# Patient Record
Sex: Female | Born: 1960 | Race: White | Hispanic: No | Marital: Married | Smoking: Former smoker
Health system: Southern US, Community
[De-identification: ages and names within clinical notes are randomized; demographics above are authoritative.]

## PROBLEM LIST (undated history)

## (undated) DIAGNOSIS — I10 Essential (primary) hypertension: Secondary | ICD-10-CM

## (undated) DIAGNOSIS — K219 Gastro-esophageal reflux disease without esophagitis: Secondary | ICD-10-CM

## (undated) DIAGNOSIS — Z8619 Personal history of other infectious and parasitic diseases: Secondary | ICD-10-CM

## (undated) DIAGNOSIS — M8580 Other specified disorders of bone density and structure, unspecified site: Secondary | ICD-10-CM

## (undated) DIAGNOSIS — M1990 Unspecified osteoarthritis, unspecified site: Secondary | ICD-10-CM

## (undated) HISTORY — PX: JOINT REPLACEMENT: SHX530

## (undated) HISTORY — PX: ROOT CANAL: SHX2363

## (undated) HISTORY — PX: WISDOM TOOTH EXTRACTION: SHX21

## (undated) HISTORY — DX: Essential (primary) hypertension: I10

## (undated) HISTORY — DX: Other specified disorders of bone density and structure, unspecified site: M85.80

## (undated) HISTORY — DX: Gastro-esophageal reflux disease without esophagitis: K21.9

## (undated) HISTORY — DX: Unspecified osteoarthritis, unspecified site: M19.90

## (undated) HISTORY — DX: Personal history of other infectious and parasitic diseases: Z86.19

---

## 1988-02-27 HISTORY — PX: OVARIAN CYST REMOVAL: SHX89

## 1989-02-26 HISTORY — PX: CHOLECYSTECTOMY: SHX55

## 2000-03-06 ENCOUNTER — Encounter: Admission: RE | Admit: 2000-03-06 | Discharge: 2000-04-25 | Payer: Self-pay | Admitting: Internal Medicine

## 2000-06-18 ENCOUNTER — Other Ambulatory Visit: Admission: RE | Admit: 2000-06-18 | Discharge: 2000-06-18 | Payer: Self-pay | Admitting: Obstetrics and Gynecology

## 2000-11-06 ENCOUNTER — Encounter: Payer: Self-pay | Admitting: Obstetrics and Gynecology

## 2000-11-06 ENCOUNTER — Ambulatory Visit (HOSPITAL_COMMUNITY): Admission: RE | Admit: 2000-11-06 | Discharge: 2000-11-06 | Payer: Self-pay | Admitting: Obstetrics and Gynecology

## 2001-06-18 ENCOUNTER — Other Ambulatory Visit: Admission: RE | Admit: 2001-06-18 | Discharge: 2001-06-18 | Payer: Self-pay | Admitting: Obstetrics and Gynecology

## 2002-10-14 ENCOUNTER — Other Ambulatory Visit: Admission: RE | Admit: 2002-10-14 | Discharge: 2002-10-14 | Payer: Self-pay | Admitting: Obstetrics and Gynecology

## 2002-10-14 ENCOUNTER — Encounter: Payer: Self-pay | Admitting: Obstetrics and Gynecology

## 2002-10-14 ENCOUNTER — Ambulatory Visit (HOSPITAL_COMMUNITY): Admission: RE | Admit: 2002-10-14 | Discharge: 2002-10-14 | Payer: Self-pay | Admitting: Obstetrics and Gynecology

## 2004-06-30 ENCOUNTER — Ambulatory Visit: Payer: Self-pay | Admitting: Internal Medicine

## 2004-10-27 ENCOUNTER — Encounter: Payer: Self-pay | Admitting: Internal Medicine

## 2004-12-01 ENCOUNTER — Ambulatory Visit: Payer: Self-pay | Admitting: Internal Medicine

## 2006-05-20 ENCOUNTER — Ambulatory Visit: Payer: Self-pay | Admitting: Internal Medicine

## 2006-05-21 ENCOUNTER — Encounter: Payer: Self-pay | Admitting: Internal Medicine

## 2006-09-03 ENCOUNTER — Ambulatory Visit: Payer: Self-pay | Admitting: Internal Medicine

## 2006-09-03 DIAGNOSIS — R229 Localized swelling, mass and lump, unspecified: Secondary | ICD-10-CM

## 2006-10-04 ENCOUNTER — Encounter: Payer: Self-pay | Admitting: Internal Medicine

## 2007-03-10 ENCOUNTER — Ambulatory Visit: Payer: Self-pay | Admitting: Internal Medicine

## 2007-03-10 DIAGNOSIS — M79609 Pain in unspecified limb: Secondary | ICD-10-CM | POA: Insufficient documentation

## 2007-06-25 ENCOUNTER — Encounter: Payer: Self-pay | Admitting: Infectious Diseases

## 2007-07-02 ENCOUNTER — Telehealth (INDEPENDENT_AMBULATORY_CARE_PROVIDER_SITE_OTHER): Payer: Self-pay | Admitting: *Deleted

## 2007-07-07 ENCOUNTER — Encounter: Payer: Self-pay | Admitting: Internal Medicine

## 2007-07-08 ENCOUNTER — Ambulatory Visit: Payer: Self-pay | Admitting: Internal Medicine

## 2007-07-08 DIAGNOSIS — B333 Retrovirus infections, not elsewhere classified: Secondary | ICD-10-CM

## 2007-07-08 DIAGNOSIS — IMO0001 Reserved for inherently not codable concepts without codable children: Secondary | ICD-10-CM

## 2007-07-08 HISTORY — DX: Reserved for inherently not codable concepts without codable children: IMO0001

## 2007-08-12 ENCOUNTER — Ambulatory Visit: Payer: Self-pay | Admitting: Infectious Diseases

## 2008-03-04 ENCOUNTER — Encounter: Admission: RE | Admit: 2008-03-04 | Discharge: 2008-03-04 | Payer: Self-pay | Admitting: Sports Medicine

## 2008-03-04 ENCOUNTER — Ambulatory Visit: Payer: Self-pay | Admitting: Sports Medicine

## 2008-03-04 DIAGNOSIS — M25559 Pain in unspecified hip: Secondary | ICD-10-CM

## 2008-03-04 DIAGNOSIS — M722 Plantar fascial fibromatosis: Secondary | ICD-10-CM

## 2008-04-13 ENCOUNTER — Ambulatory Visit (HOSPITAL_COMMUNITY): Admission: RE | Admit: 2008-04-13 | Discharge: 2008-04-13 | Payer: Self-pay | Admitting: Obstetrics and Gynecology

## 2008-04-20 ENCOUNTER — Ambulatory Visit: Payer: Self-pay | Admitting: Sports Medicine

## 2008-04-23 ENCOUNTER — Other Ambulatory Visit: Admission: RE | Admit: 2008-04-23 | Discharge: 2008-04-23 | Payer: Self-pay | Admitting: Obstetrics and Gynecology

## 2008-06-23 ENCOUNTER — Ambulatory Visit: Payer: Self-pay | Admitting: Sports Medicine

## 2008-07-14 ENCOUNTER — Ambulatory Visit: Payer: Self-pay | Admitting: Sports Medicine

## 2008-08-05 ENCOUNTER — Ambulatory Visit: Payer: Self-pay | Admitting: Sports Medicine

## 2008-11-11 ENCOUNTER — Ambulatory Visit: Payer: Self-pay | Admitting: Sports Medicine

## 2008-11-11 DIAGNOSIS — M1610 Unilateral primary osteoarthritis, unspecified hip: Secondary | ICD-10-CM | POA: Insufficient documentation

## 2008-11-11 DIAGNOSIS — M169 Osteoarthritis of hip, unspecified: Secondary | ICD-10-CM | POA: Insufficient documentation

## 2008-11-11 HISTORY — DX: Osteoarthritis of hip, unspecified: M16.9

## 2008-11-11 HISTORY — DX: Unilateral primary osteoarthritis, unspecified hip: M16.10

## 2008-12-09 ENCOUNTER — Encounter: Admission: RE | Admit: 2008-12-09 | Discharge: 2008-12-09 | Payer: Self-pay | Admitting: Orthopedic Surgery

## 2008-12-13 ENCOUNTER — Encounter: Payer: Self-pay | Admitting: Sports Medicine

## 2009-02-26 HISTORY — PX: TOTAL HIP ARTHROPLASTY: SHX124

## 2009-03-22 ENCOUNTER — Encounter: Payer: Self-pay | Admitting: Internal Medicine

## 2009-04-23 ENCOUNTER — Encounter: Payer: Self-pay | Admitting: Internal Medicine

## 2009-06-08 ENCOUNTER — Ambulatory Visit (HOSPITAL_COMMUNITY): Admission: RE | Admit: 2009-06-08 | Discharge: 2009-06-08 | Payer: Self-pay | Admitting: Obstetrics and Gynecology

## 2009-06-14 ENCOUNTER — Encounter: Payer: Self-pay | Admitting: Internal Medicine

## 2009-11-17 ENCOUNTER — Encounter: Payer: Self-pay | Admitting: Internal Medicine

## 2010-03-28 NOTE — Letter (Signed)
Summary: Appts Meds & Instructions/[facility]  [facility] Meds & Instructions/[facility]   Imported By: Lanelle Bal 05/05/2009 09:40:01  _____________________________________________________________________  External Attachment:    Type:   Image     Comment:   External Document

## 2010-03-28 NOTE — Letter (Signed)
Summary: doing well----[facility]  [facility]   Imported By: Lanelle Bal 12/05/2009 12:06:05  _____________________________________________________________________  External Attachment:    Type:   Image     Comment:   External Document

## 2010-03-28 NOTE — Letter (Signed)
Summary: s/p R hip replacement  [facility]   Imported By: Lanelle Bal 06/28/2009 09:48:29  _____________________________________________________________________  External Attachment:    Type:   Image     Comment:   External Document

## 2010-03-28 NOTE — Letter (Signed)
Summary: [facility]  [facility]   Imported By: Lanelle Bal 04/05/2009 07:54:19  _____________________________________________________________________  External Attachment:    Type:   Image     Comment:   External Document

## 2010-05-19 ENCOUNTER — Other Ambulatory Visit: Payer: Self-pay | Admitting: Obstetrics and Gynecology

## 2010-05-19 DIAGNOSIS — Z1231 Encounter for screening mammogram for malignant neoplasm of breast: Secondary | ICD-10-CM

## 2010-06-16 ENCOUNTER — Ambulatory Visit (HOSPITAL_COMMUNITY)
Admission: RE | Admit: 2010-06-16 | Discharge: 2010-06-16 | Disposition: A | Discharge status: Home / Self Care | Payer: BC Managed Care – PPO | Source: Ambulatory Visit | Attending: Obstetrics and Gynecology | Admitting: Obstetrics and Gynecology

## 2010-06-16 DIAGNOSIS — Z1231 Encounter for screening mammogram for malignant neoplasm of breast: Secondary | ICD-10-CM | POA: Insufficient documentation

## 2011-04-24 ENCOUNTER — Encounter: Payer: Self-pay | Admitting: Internal Medicine

## 2011-04-24 ENCOUNTER — Ambulatory Visit (INDEPENDENT_AMBULATORY_CARE_PROVIDER_SITE_OTHER): Payer: BC Managed Care – PPO | Admitting: Internal Medicine

## 2011-04-24 VITALS — BP 128/74 | HR 81 | Temp 98.4°F | Wt 169.0 lb

## 2011-04-24 DIAGNOSIS — L918 Other hypertrophic disorders of the skin: Secondary | ICD-10-CM

## 2011-04-24 DIAGNOSIS — L919 Hypertrophic disorder of the skin, unspecified: Secondary | ICD-10-CM

## 2011-04-24 DIAGNOSIS — I8390 Asymptomatic varicose veins of unspecified lower extremity: Secondary | ICD-10-CM

## 2011-04-24 HISTORY — DX: Asymptomatic varicose veins of unspecified lower extremity: I83.90

## 2011-04-24 HISTORY — DX: Other hypertrophic disorders of the skin: L91.8

## 2011-04-24 NOTE — Assessment & Plan Note (Signed)
New "lesion" seems to be the scar of the skin tag that fell of. Recommend reasses in 3 months, if a suspicious lesion is present , will need to came out. Pt in agreement

## 2011-04-24 NOTE — Assessment & Plan Note (Signed)
Exam consistent w/ varicose veins. Discussed what it is, sx of phlebitis, use of compression stokings

## 2011-04-24 NOTE — Progress Notes (Signed)
  Subjective:    Patient ID: Lauren Walters, female    DOB: [birth year], 51 y.o.   MRN: [id number]  HPI New patient, acute visit Long history of a skin growth under the right breast (classic skin tag per pt description), a week ago the area got swollen, tender. The tag fell off but now she has a new lesion there. Also last week noted the veins at the right popliteal area are definitely larger than the left. The area is sometimes achy, pain decreases with stretching.  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 No Lower  extremity edema.     Objective:   Physical Exam Alert, oriented x3. No apparent distress. Skin: Right under the right breast, there is a 2 mm dome shaped lesion, slightly red, no discharge. At the left breast she has a classic skin tag that has been there for years. Lower extremities: No edema, she has few, small varicose veins mostly at the popliteal area bilaterally, they are actually symmetric. No evidence of phlebitis. There is no mass in the popliteal area on either leg I can tell. Normal pedal pulses bilaterally.     Assessment & Plan:

## 2011-04-24 NOTE — Patient Instructions (Signed)
Came back in 3 - 4 months if the lesion under the breast is not better or gone   Varicose Veins Varicose veins are veins that have become enlarged and twisted. CAUSES This condition is the result of valves in the veins not working properly. Valves in the veins help return blood from the leg to the heart. If these valves are damaged, blood flows backwards and backs up into the veins in the leg near the skin. This causes the veins to become larger. People who are on their feet a lot, who are pregnant, or who are overweight are more likely to develop varicose veins. SYMPTOMS   Bulging, twisted-appearing, bluish veins, most commonly found on the legs.   Leg pain or a feeling of heaviness. These symptoms may be worse at the end of the day.   Leg swelling.   Skin color changes.  DIAGNOSIS  Varicose veins can usually be diagnosed with an exam of your legs by your caregiver. He or she may recommend an ultrasound of your leg veins. TREATMENT  Most varicose veins can be treated at home.However, other treatments are available for people who have persistent symptoms or who want to treat the cosmetic appearance of the varicose veins. These include:  Laser treatment of very small varicose veins.   Medicine that is shot (injected) into the vein. This medicine hardens the walls of the vein and closes off the vein. This treatment is called sclerotherapy. Afterwards, you may need to wear clothing or bandages that apply pressure.   Surgery.  HOME CARE INSTRUCTIONS   Do not stand or sit in one position for long periods of time. Do not sit with your legs crossed. Rest with your legs raised during the day.   Wear elastic stockings or support hose. Do not wear other tight, encircling garments around the legs, pelvis, or waist.   Walk as much as possible to increase blood flow.   Raise the foot of your bed at night with 2-inch blocks.   If you get a cut in the skin over the vein and the vein bleeds, lie  down with your leg raised and press on it with a clean cloth until the bleeding stops. Then place a bandage (dressing) on the cut. See your caregiver if it continues to bleed or needs stitches.  SEEK MEDICAL CARE IF:   The skin around your ankle starts to break down.   You have pain, redness, tenderness, or hard swelling developing in your leg over a vein.   You are uncomfortable due to leg pain.  Document Released: 11/22/2004 Document Revised: 10/25/2010 Document Reviewed: 04/10/2010 [facility] Patient Information 2012 [place], [place].

## 2011-06-04 ENCOUNTER — Other Ambulatory Visit: Payer: Self-pay | Admitting: Obstetrics and Gynecology

## 2011-06-04 DIAGNOSIS — Z1231 Encounter for screening mammogram for malignant neoplasm of breast: Secondary | ICD-10-CM

## 2011-06-19 ENCOUNTER — Ambulatory Visit (INDEPENDENT_AMBULATORY_CARE_PROVIDER_SITE_OTHER): Payer: BC Managed Care – PPO | Admitting: Physician Assistant

## 2011-06-19 VITALS — BP 126/83 | HR 60 | Temp 98.4°F | Resp 16 | Ht 70.0 in | Wt 166.0 lb

## 2011-06-19 DIAGNOSIS — R21 Rash and other nonspecific skin eruption: Secondary | ICD-10-CM

## 2011-06-19 LAB — POCT SKIN KOH: Skin KOH, POC: NEGATIVE

## 2011-06-19 MED ORDER — CETIRIZINE HCL 10 MG PO TABS: 10.0000 mg | ORAL_TABLET | Freq: Every day | ORAL | Status: DC

## 2011-06-19 MED ORDER — TRIAMCINOLONE ACETONIDE 0.1 % EX CREA: TOPICAL_CREAM | Freq: Two times a day (BID) | CUTANEOUS | Status: AC

## 2011-06-19 NOTE — Progress Notes (Signed)
  Subjective:    Patient ID: Lauren Walters, female    DOB: September 16, 1960, 51 y.o.   MRN: [id number]  HPI Slightly Pruritic rash on neck X ~1 week.  Lotion not helping.  No new soaps, detergents, etc.  No new pets.    Review of Systems  All other systems reviewed and are negative.       Objective:   Physical Exam  Nursing note and vitals reviewed. Constitutional: She appears well-developed and well-nourished.  HENT:  Head: Normocephalic and atraumatic.  Neck: Normal range of motion. Neck supple.  Cardiovascular: Normal rate, regular rhythm and normal heart sounds.   Pulmonary/Chest: Effort normal and breath sounds normal.  Skin:       Anterior neck with 2 maculopapular regions of dry skin  ~2X3 cm each   Results for orders placed in visit on 06/19/11  POCT SKIN KOH      Component Value Range   Skin KOH, POC Negative        Assessment & Plan:  ?Appears eczematous-start zyrtec and triamcinolone and check in with me later this week.

## 2011-07-02 ENCOUNTER — Ambulatory Visit (HOSPITAL_COMMUNITY)
Admission: RE | Admit: 2011-07-02 | Discharge: 2011-07-02 | Disposition: A | Discharge status: Home / Self Care | Payer: BC Managed Care – PPO | Source: Ambulatory Visit | Attending: Obstetrics and Gynecology | Admitting: Obstetrics and Gynecology

## 2011-07-02 DIAGNOSIS — Z1231 Encounter for screening mammogram for malignant neoplasm of breast: Secondary | ICD-10-CM | POA: Insufficient documentation

## 2011-07-18 LAB — HM MAMMOGRAPHY: HM Mammogram: NORMAL

## 2012-07-17 LAB — HM PAP SMEAR: HM Pap smear: NORMAL

## 2013-05-01 ENCOUNTER — Ambulatory Visit: Payer: BC Managed Care – PPO | Admitting: Obstetrics and Gynecology

## 2013-05-04 ENCOUNTER — Other Ambulatory Visit: Payer: Self-pay | Admitting: *Deleted

## 2013-05-04 MED ORDER — ESTRADIOL 2 MG VA RING: 2.0000 mg | VAGINAL_RING | VAGINAL | Status: DC

## 2013-05-04 NOTE — Telephone Encounter (Signed)
Last AEX 04/25/12 Last refill 04/18/2012 x 1 year refills No future appt scheduled.   Will refill once. - Pt will need AEX for further refills.

## 2013-07-17 ENCOUNTER — Ambulatory Visit (INDEPENDENT_AMBULATORY_CARE_PROVIDER_SITE_OTHER): Payer: BC Managed Care – PPO | Admitting: Family Medicine

## 2013-07-17 ENCOUNTER — Encounter: Payer: Self-pay | Admitting: Family Medicine

## 2013-07-17 VITALS — BP 126/86 | HR 80 | Temp 98.0°F | Resp 16 | Ht 70.0 in | Wt 169.2 lb

## 2013-07-17 DIAGNOSIS — Z79890 Hormone replacement therapy (postmenopausal): Secondary | ICD-10-CM | POA: Insufficient documentation

## 2013-07-17 DIAGNOSIS — F17200 Nicotine dependence, unspecified, uncomplicated: Secondary | ICD-10-CM

## 2013-07-17 HISTORY — DX: Hormone replacement therapy: Z79.890

## 2013-07-17 MED ORDER — ESTRADIOL 2 MG VA RING: 2.0000 mg | VAGINAL_RING | VAGINAL | Status: DC

## 2013-07-17 MED ORDER — BUPROPION HCL ER (XL) 150 MG PO TB24: 150.0000 mg | ORAL_TABLET | Freq: Every day | ORAL | Status: DC

## 2013-07-17 NOTE — Progress Notes (Signed)
Pre visit review using our clinic review tool, if applicable. No additional management support is needed unless otherwise documented below in the visit note. 

## 2013-07-17 NOTE — Patient Instructions (Signed)
Follow up in 4-6 weeks to recheck smoking and anxiety Schedule your complete physical in 6 months Start the Wellbutrin daily to help quit smoking Set your quit date for 2-3 weeks after starting medication Use money and area restrictions as a motivator We'll call you with your mammogram appt Call with any questions or concerns Welcome!  We're glad to have you!!!

## 2013-07-17 NOTE — Progress Notes (Signed)
   Subjective:    Patient ID: Lauren Walters, female    DOB: 1960-09-04, 53 y.o.   MRN: [id number]  HPI New to establish.  Previous MD- Romine (GYN).  Pap- 2014  Health maintenance- has never had colonoscopy, UTD on pap.  [facility]  HRT- on Estring, started by GYN.  due for mammo  Tobacco use- smoking <1 ppd, started as a teen.  Has attempted to quit multiple times in the past.  Interested in quitting at this time- 'very'.  Has tried the patch and the lozenges previously w/o success.   Review of Systems For ROS see HPI     Objective:   Physical Exam  Vitals reviewed. Constitutional: She is oriented to person, place, and time. She appears well-developed and well-nourished. No distress.  HENT:  Head: Normocephalic and atraumatic.  Eyes: Conjunctivae and EOM are normal. Pupils are equal, round, and reactive to light.  Neck: Normal range of motion. Neck supple. No thyromegaly present.  Cardiovascular: Normal rate, regular rhythm, normal heart sounds and intact distal pulses.   No murmur heard. Pulmonary/Chest: Effort normal and breath sounds normal. No respiratory distress.  Abdominal: Soft. She exhibits no distension. There is no tenderness.  Musculoskeletal: She exhibits no edema.  Lymphadenopathy:    She has no cervical adenopathy.  Neurological: She is alert and oriented to person, place, and time.  Skin: Skin is warm and dry.  Psychiatric: She has a normal mood and affect. Her behavior is normal.          Assessment & Plan:

## 2013-07-17 NOTE — Assessment & Plan Note (Signed)
New to provider, ongoing for pt.  Initiated by GYN.  Stressed that if pt wants to continue estrogen replacement that she must have yearly mammos.  Pt agreeable to this.  Order entered.

## 2013-07-17 NOTE — Assessment & Plan Note (Signed)
New to provider, chronic for pt.  Very interested in quitting at this time.  Discussed medication options- chantix/wellbutrin vs nicotine replacement vs behavioral modifications.  Pt opts for Wellbutrin coupled w/ behavioral motivators (placing money that she would spend on cigarettes in a jar to save for something she wants).  Script given.  Will follow.

## 2013-07-21 ENCOUNTER — Telehealth: Payer: Self-pay | Admitting: Family Medicine

## 2013-07-21 NOTE — Telephone Encounter (Signed)
Relevant patient education mailed to patient.  

## 2013-07-23 ENCOUNTER — Encounter: Payer: Self-pay | Admitting: Family Medicine

## 2013-07-28 ENCOUNTER — Ambulatory Visit (HOSPITAL_COMMUNITY): Payer: BC Managed Care – PPO

## 2013-08-21 ENCOUNTER — Ambulatory Visit (INDEPENDENT_AMBULATORY_CARE_PROVIDER_SITE_OTHER): Payer: BC Managed Care – PPO | Admitting: Family Medicine

## 2013-08-21 ENCOUNTER — Encounter: Payer: Self-pay | Admitting: Family Medicine

## 2013-08-21 VITALS — BP 124/80 | HR 64 | Temp 98.7°F | Resp 16 | Wt 172.0 lb

## 2013-08-21 DIAGNOSIS — F17200 Nicotine dependence, unspecified, uncomplicated: Secondary | ICD-10-CM

## 2013-08-21 NOTE — Patient Instructions (Signed)
Follow up as scheduled Call me the end of August and let me know how the smoking cessation is progressing Continue the Wellbutrin until then If needed, we can always do Chantix if needed Call with any questions or concerns HANG IN THERE!  You can do this!

## 2013-08-21 NOTE — Progress Notes (Signed)
   Subjective:    Patient ID: Lauren Walters, female    DOB: 1960-07-30, 53 y.o.   MRN: [id number]  HPI Smoking cessation- pt started Wellbutrin in late May for both anxiety and smoking.  Pt has cut back on smoking- now smoking 5-10 cigs/day which is half of her previous amount.  She's not sure if this is due to Wellbutrin.  Not ready to switch to Chantix- 'it makes me nervous'.   Review of Systems For ROS see HPI     Objective:   Physical Exam  Vitals reviewed. Constitutional: She is oriented to person, place, and time. She appears well-developed and well-nourished. No distress.  HENT:  Head: Normocephalic and atraumatic.  Neurological: She is alert and oriented to person, place, and time.  Skin: Skin is warm and dry.  Psychiatric: She has a normal mood and affect. Her behavior is normal. Thought content normal.          Assessment & Plan:

## 2013-08-21 NOTE — Progress Notes (Signed)
Pre visit review using our clinic review tool, if applicable. No additional management support is needed unless otherwise documented below in the visit note. 

## 2013-08-21 NOTE — Assessment & Plan Note (Signed)
Pt has cut tobacco use in half.  Applauded her efforts.  She is not ready to try Chantix so we will continue w/ Wellbutrin for the next 2 months and then reassess.  Pt expressed understanding and is in agreement w/ plan.

## 2013-09-11 ENCOUNTER — Other Ambulatory Visit: Payer: Self-pay | Admitting: Family Medicine

## 2013-09-11 ENCOUNTER — Ambulatory Visit (HOSPITAL_COMMUNITY)
Admission: RE | Admit: 2013-09-11 | Discharge: 2013-09-11 | Disposition: A | Discharge status: Home / Self Care | Payer: BC Managed Care – PPO | Source: Ambulatory Visit | Attending: Family Medicine | Admitting: Family Medicine

## 2013-09-11 DIAGNOSIS — Z1231 Encounter for screening mammogram for malignant neoplasm of breast: Secondary | ICD-10-CM

## 2013-09-11 DIAGNOSIS — Z79890 Hormone replacement therapy (postmenopausal): Secondary | ICD-10-CM

## 2013-10-06 ENCOUNTER — Other Ambulatory Visit: Payer: Self-pay | Admitting: Family Medicine

## 2013-10-06 NOTE — Telephone Encounter (Signed)
Med filled.  

## 2013-10-09 ENCOUNTER — Other Ambulatory Visit: Payer: Self-pay | Admitting: General Practice

## 2013-10-09 DIAGNOSIS — F17200 Nicotine dependence, unspecified, uncomplicated: Secondary | ICD-10-CM

## 2013-10-09 MED ORDER — BUPROPION HCL ER (XL) 150 MG PO TB24: 150.0000 mg | ORAL_TABLET | Freq: Every day | ORAL | Status: DC

## 2013-10-13 ENCOUNTER — Other Ambulatory Visit: Payer: Self-pay | Admitting: General Practice

## 2013-10-13 DIAGNOSIS — F17200 Nicotine dependence, unspecified, uncomplicated: Secondary | ICD-10-CM

## 2013-10-13 MED ORDER — BUPROPION HCL ER (XL) 150 MG PO TB24: 150.0000 mg | ORAL_TABLET | Freq: Every day | ORAL | Status: DC

## 2013-12-11 ENCOUNTER — Encounter: Payer: Self-pay | Admitting: Family Medicine

## 2013-12-18 ENCOUNTER — Encounter: Payer: Self-pay | Admitting: Family Medicine

## 2013-12-18 ENCOUNTER — Ambulatory Visit (INDEPENDENT_AMBULATORY_CARE_PROVIDER_SITE_OTHER): Payer: BC Managed Care – PPO | Admitting: Family Medicine

## 2013-12-18 VITALS — BP 122/78 | HR 61 | Temp 98.1°F | Resp 16 | Ht 70.0 in | Wt 176.1 lb

## 2013-12-18 DIAGNOSIS — Z0000 Encounter for general adult medical examination without abnormal findings: Secondary | ICD-10-CM | POA: Insufficient documentation

## 2013-12-18 DIAGNOSIS — Z23 Encounter for immunization: Secondary | ICD-10-CM

## 2013-12-18 DIAGNOSIS — Z1211 Encounter for screening for malignant neoplasm of colon: Secondary | ICD-10-CM

## 2013-12-18 DIAGNOSIS — Z720 Tobacco use: Secondary | ICD-10-CM

## 2013-12-18 DIAGNOSIS — F17200 Nicotine dependence, unspecified, uncomplicated: Secondary | ICD-10-CM

## 2013-12-18 HISTORY — DX: Encounter for general adult medical examination without abnormal findings: Z00.00

## 2013-12-18 LAB — HEPATIC FUNCTION PANEL
ALBUMIN: 4.2 g/dL (ref 3.5–5.2)
ALK PHOS: 51 U/L (ref 39–117)
ALT: 21 U/L (ref 0–35)
AST: 31 U/L (ref 0–37)
Bilirubin, Direct: 0 mg/dL (ref 0.0–0.3)
TOTAL PROTEIN: 7.7 g/dL (ref 6.0–8.3)
Total Bilirubin: 0.6 mg/dL (ref 0.2–1.2)

## 2013-12-18 LAB — CBC WITH DIFFERENTIAL/PLATELET
BASOS PCT: 0.8 % (ref 0.0–3.0)
Basophils Absolute: 0 10*3/uL (ref 0.0–0.1)
EOS ABS: 0.2 10*3/uL (ref 0.0–0.7)
Eosinophils Relative: 2.9 % (ref 0.0–5.0)
HEMATOCRIT: 44.7 % (ref 36.0–46.0)
Hemoglobin: 14.7 g/dL (ref 12.0–15.0)
LYMPHS ABS: 1.9 10*3/uL (ref 0.7–4.0)
Lymphocytes Relative: 29.4 % (ref 12.0–46.0)
MCHC: 32.8 g/dL (ref 30.0–36.0)
MCV: 94.7 fl (ref 78.0–100.0)
MONO ABS: 0.4 10*3/uL (ref 0.1–1.0)
Monocytes Relative: 6.6 % (ref 3.0–12.0)
NEUTROS PCT: 60.3 % (ref 43.0–77.0)
Neutro Abs: 3.9 10*3/uL (ref 1.4–7.7)
PLATELETS: 259 10*3/uL (ref 150.0–400.0)
RBC: 4.73 Mil/uL (ref 3.87–5.11)
RDW: 14.1 % (ref 11.5–15.5)
WBC: 6.4 10*3/uL (ref 4.0–10.5)

## 2013-12-18 LAB — LIPID PANEL
CHOL/HDL RATIO: 2
CHOLESTEROL: 221 mg/dL — AB (ref 0–200)
HDL: 88.7 mg/dL (ref >39.00)
LDL CALC: 109 mg/dL — AB (ref 0–99)
NonHDL: 132.3
TRIGLYCERIDES: 116 mg/dL (ref 0.0–149.0)
VLDL: 23.2 mg/dL (ref 0.0–40.0)

## 2013-12-18 LAB — BASIC METABOLIC PANEL
BUN: 12 mg/dL (ref 6–23)
CALCIUM: 9.8 mg/dL (ref 8.4–10.5)
CO2: 27 mEq/L (ref 19–32)
CREATININE: 0.9 mg/dL (ref 0.4–1.2)
Chloride: 107 mEq/L (ref 96–112)
GFR: 73.72 mL/min (ref >60.00)
GLUCOSE: 94 mg/dL (ref 70–99)
Potassium: 4.1 mEq/L (ref 3.5–5.1)
Sodium: 141 mEq/L (ref 135–145)

## 2013-12-18 LAB — TSH: TSH: 1.8 u[IU]/mL (ref 0.35–4.50)

## 2013-12-18 LAB — VITAMIN D 25 HYDROXY (VIT D DEFICIENCY, FRACTURES): VITD: 30.87 ng/mL (ref 30.00–100.00)

## 2013-12-18 NOTE — Progress Notes (Signed)
   Subjective:    Patient ID: Lauren Walters, female    DOB: 12/05/1960, 53 y.o.   MRN: [id number]  HPI CPE- UTD on mammo, pap.  Due for colonoscopy- pt needs to wait until after the new year due to scheduling.  Pt continues to smoke 1/2 ppd.  Was not successful w/ Wellbutrin- stopped meds.  Pt wants to think about Chantix.   Review of Systems Patient reports no vision/ hearing changes, adenopathy,fever, weight change,  persistant/recurrent hoarseness , swallowing issues, chest pain, palpitations, edema, persistant/recurrent cough, hemoptysis, dyspnea (rest/exertional/paroxysmal nocturnal), gastrointestinal bleeding (melena, rectal bleeding), abdominal pain, significant heartburn, bowel changes, GU symptoms (dysuria, hematuria, incontinence), Gyn symptoms (abnormal  bleeding, pain),  syncope, focal weakness, memory loss, numbness & tingling, skin/hair/nail changes, abnormal bruising or bleeding, anxiety, or depression.     Objective:   Physical Exam General Appearance:    Alert, cooperative, no distress, appears stated age  Head:    Normocephalic, without obvious abnormality, atraumatic  Eyes:    PERRL, conjunctiva/corneas clear, EOM's intact, fundi    benign, both eyes  Ears:    Normal TM's and external ear canals, both ears  Nose:   Nares normal, septum midline, mucosa normal, no drainage    or sinus tenderness  Throat:   Lips, mucosa, and tongue normal; teeth and gums normal  Neck:   Supple, symmetrical, trachea midline, no adenopathy;    Thyroid: no enlargement/tenderness/nodules  Back:     Symmetric, no curvature, ROM normal, no CVA tenderness  Lungs:     Clear to auscultation bilaterally, respirations unlabored  Chest Wall:    No tenderness or deformity   Heart:    Regular rate and rhythm, S1 and S2 normal, no murmur, rub   or gallop  Breast Exam:    Deferred to mammo  Abdomen:     Soft, non-tender, bowel sounds active all four quadrants,    no masses, no organomegaly    Genitalia:    Deferred  Rectal:    Extremities:   Extremities normal, atraumatic, no cyanosis or edema  Pulses:   2+ and symmetric all extremities  Skin:   Skin color, texture, turgor normal, no rashes or lesions  Lymph nodes:   Cervical, supraclavicular, and axillary nodes normal  Neurologic:   CNII-XII intact, normal strength, sensation and reflexes    throughout          Assessment & Plan:

## 2013-12-18 NOTE — Progress Notes (Signed)
Pre visit review using our clinic review tool, if applicable. No additional management support is needed unless otherwise documented below in the visit note. 

## 2013-12-18 NOTE — Patient Instructions (Signed)
Follow up in 1 yr or as needed We'll notify you of your lab results and make any changes if needed Please consider quitting smoking- you can do it!  Consider Chantix and let me know Keep up the good work!  You look great! Call with any questions or concerns Happy Fall!!!

## 2013-12-18 NOTE — Assessment & Plan Note (Signed)
Again encouraged pt to quit smoking.  Discussed use of Chantix.  Pt prefers to do additional research and think about this before proceeding.  Pt to call or mychart if she decides to move forward.

## 2013-12-18 NOTE — Assessment & Plan Note (Signed)
Pt's PE WNL.  UTD on mammo and pap.  Due for colonoscopy but unable to do so until Jan 2016- referral entered.  Check labs.  Anticipatory guidance provided.

## 2013-12-21 ENCOUNTER — Telehealth: Payer: Self-pay | Admitting: Family Medicine

## 2013-12-21 NOTE — Telephone Encounter (Signed)
emmi emailed °

## 2013-12-25 ENCOUNTER — Encounter: Payer: Self-pay | Admitting: Internal Medicine

## 2014-01-25 ENCOUNTER — Other Ambulatory Visit: Payer: Self-pay | Admitting: General Practice

## 2014-01-25 MED ORDER — ESTRADIOL 2 MG VA RING: VAGINAL_RING | VAGINAL | Status: DC

## 2014-03-26 ENCOUNTER — Encounter: Payer: BC Managed Care – PPO | Admitting: Internal Medicine

## 2014-04-01 ENCOUNTER — Other Ambulatory Visit: Payer: Self-pay | Admitting: Family Medicine

## 2014-04-01 ENCOUNTER — Encounter: Payer: Self-pay | Admitting: Family Medicine

## 2014-04-01 MED ORDER — ESTRADIOL 2 MG VA RING: VAGINAL_RING | VAGINAL | Status: DC

## 2014-04-01 NOTE — Telephone Encounter (Signed)
Med filled.  

## 2014-05-14 ENCOUNTER — Encounter: Payer: Self-pay | Admitting: Family Medicine

## 2014-05-14 MED ORDER — VARENICLINE TARTRATE 0.5 MG X 11 & 1 MG X 42 PO MISC: ORAL | Status: DC

## 2014-05-14 MED ORDER — VARENICLINE TARTRATE 1 MG PO TABS: 1.0000 mg | ORAL_TABLET | Freq: Two times a day (BID) | ORAL | Status: DC

## 2014-06-30 ENCOUNTER — Encounter: Payer: Self-pay | Admitting: Family Medicine

## 2014-12-15 ENCOUNTER — Encounter: Payer: Self-pay | Admitting: Family Medicine

## 2014-12-16 ENCOUNTER — Encounter: Payer: Self-pay | Admitting: Family

## 2014-12-22 ENCOUNTER — Encounter: Payer: BC Managed Care – PPO | Admitting: Family Medicine

## 2014-12-28 ENCOUNTER — Telehealth: Payer: Self-pay | Admitting: *Deleted

## 2014-12-28 NOTE — Telephone Encounter (Signed)
Patient signed ROI received via fax from [facility]. Forwarded to [place] to scan/email to medical records. JG//CMA

## 2015-01-27 ENCOUNTER — Telehealth: Payer: Self-pay

## 2015-01-27 NOTE — Telephone Encounter (Signed)
Called patient to update chart for Physical on tomorrow. Patient rushed says nothing new she is not complex.

## 2015-01-28 ENCOUNTER — Ambulatory Visit (INDEPENDENT_AMBULATORY_CARE_PROVIDER_SITE_OTHER): Payer: BLUE CROSS/BLUE SHIELD | Admitting: Family

## 2015-01-28 ENCOUNTER — Encounter: Payer: Self-pay | Admitting: Family

## 2015-01-28 VITALS — BP 154/77 | HR 67 | Temp 97.9°F | Resp 16 | Ht 70.0 in | Wt 176.0 lb

## 2015-01-28 DIAGNOSIS — E2839 Other primary ovarian failure: Secondary | ICD-10-CM

## 2015-01-28 DIAGNOSIS — M542 Cervicalgia: Secondary | ICD-10-CM

## 2015-01-28 DIAGNOSIS — Z0000 Encounter for general adult medical examination without abnormal findings: Secondary | ICD-10-CM

## 2015-01-28 DIAGNOSIS — Z0001 Encounter for general adult medical examination with abnormal findings: Secondary | ICD-10-CM

## 2015-01-28 LAB — URINALYSIS, ROUTINE W REFLEX MICROSCOPIC
Bilirubin Urine: NEGATIVE
KETONES UR: NEGATIVE
Leukocytes, UA: NEGATIVE
NITRITE: NEGATIVE
SPECIFIC GRAVITY, URINE: 1.01 (ref 1.000–1.030)
Total Protein, Urine: NEGATIVE
Urine Glucose: NEGATIVE
Urobilinogen, UA: 0.2 (ref 0.0–1.0)
pH: 6.5 (ref 5.0–8.0)

## 2015-01-28 LAB — BASIC METABOLIC PANEL
BUN: 11 mg/dL (ref 6–23)
CHLORIDE: 103 meq/L (ref 96–112)
CO2: 28 mEq/L (ref 19–32)
Calcium: 9.4 mg/dL (ref 8.4–10.5)
Creatinine, Ser: 0.67 mg/dL (ref 0.40–1.20)
GFR: 96.62 mL/min (ref >60.00)
Glucose, Bld: 96 mg/dL (ref 70–99)
POTASSIUM: 3.5 meq/L (ref 3.5–5.1)
SODIUM: 141 meq/L (ref 135–145)

## 2015-01-28 LAB — TSH: TSH: 1.4 u[IU]/mL (ref 0.35–4.50)

## 2015-01-28 LAB — CBC WITH DIFFERENTIAL/PLATELET
BASOS PCT: 0.5 % (ref 0.0–3.0)
Basophils Absolute: 0 10*3/uL (ref 0.0–0.1)
EOS ABS: 0.1 10*3/uL (ref 0.0–0.7)
EOS PCT: 1.7 % (ref 0.0–5.0)
HEMATOCRIT: 41.7 % (ref 36.0–46.0)
HEMOGLOBIN: 13.7 g/dL (ref 12.0–15.0)
LYMPHS PCT: 29.8 % (ref 12.0–46.0)
Lymphs Abs: 2.2 10*3/uL (ref 0.7–4.0)
MCHC: 32.8 g/dL (ref 30.0–36.0)
MCV: 93.6 fl (ref 78.0–100.0)
Monocytes Absolute: 0.5 10*3/uL (ref 0.1–1.0)
Monocytes Relative: 6.9 % (ref 3.0–12.0)
Neutro Abs: 4.5 10*3/uL (ref 1.4–7.7)
Neutrophils Relative %: 61.1 % (ref 43.0–77.0)
Platelets: 252 10*3/uL (ref 150.0–400.0)
RBC: 4.46 Mil/uL (ref 3.87–5.11)
RDW: 14.2 % (ref 11.5–15.5)
WBC: 7.4 10*3/uL (ref 4.0–10.5)

## 2015-01-28 LAB — HEPATIC FUNCTION PANEL
ALT: 17 U/L (ref 0–35)
AST: 22 U/L (ref 0–37)
Albumin: 4.4 g/dL (ref 3.5–5.2)
Alkaline Phosphatase: 44 U/L (ref 39–117)
BILIRUBIN DIRECT: 0.1 mg/dL (ref 0.0–0.3)
BILIRUBIN TOTAL: 0.5 mg/dL (ref 0.2–1.2)
Total Protein: 7 g/dL (ref 6.0–8.3)

## 2015-01-28 LAB — LIPID PANEL
CHOLESTEROL: 195 mg/dL (ref 0–200)
HDL: 80.6 mg/dL (ref >39.00)
LDL CALC: 101 mg/dL — AB (ref 0–99)
NonHDL: 114.8
TRIGLYCERIDES: 71 mg/dL (ref 0.0–149.0)
Total CHOL/HDL Ratio: 2
VLDL: 14.2 mg/dL (ref 0.0–40.0)

## 2015-01-28 MED ORDER — ESTRADIOL 2 MG VA RING: VAGINAL_RING | VAGINAL | Status: DC

## 2015-01-28 MED ORDER — MELOXICAM 7.5 MG PO TABS: 7.5000 mg | ORAL_TABLET | Freq: Every day | ORAL | Status: DC

## 2015-01-28 NOTE — Progress Notes (Signed)
Pre visit review using our clinic review tool, if applicable. No additional management support is needed unless otherwise documented below in the visit note. 

## 2015-01-28 NOTE — Patient Instructions (Addendum)
Start meloxicam (anti-inflammatory) once daily. Call if pain/tingling in arms worsens or does not improve. Complete lab work prior to leaving.

## 2015-01-28 NOTE — Progress Notes (Signed)
Subjective:    Patient ID: Lauren Walters, female    DOB: 05-13-1960, 54 y.o.   MRN: [id number]  HPI  Patient presents today for complete physical.  Immunizations: tetanus and flu up to date Diet: fair diet- feels like she could eat better Exercise: very little, enjoys biking/tennis and swimming.  Walks some at lunch- 30 minutes 5 days a week.  Colonoscopy: due  Dexa:  due Pap Smear: 2014- normal Mammogram: 7/15 Tobacco abuse- has chantix rx at home, 1/2 PPD sometimes more/sometimes less    Pt reports neck pain- intermittent tingling in right hand/arm.  This has been going on for a few months.  Notes that pain has become progressively achier.  Notices more when she drives. Denies associated weakness.  Tingling, intermittent.    Wt Readings from Last 3 Encounters:  01/28/15 176 lb (79.833 kg)  12/18/13 176 lb 2 oz (79.89 kg)  08/21/13 172 lb (78.019 kg)    Review of Systems  Constitutional: Negative for unexpected weight change.  HENT: Negative for rhinorrhea.   Respiratory: Negative for shortness of breath.        Mild cough  Cardiovascular: Negative for chest pain and leg swelling.  Gastrointestinal: Negative for diarrhea, constipation and blood in stool.  Genitourinary: Negative for dysuria and frequency.  Musculoskeletal: Negative for back pain.       Mild left knee pain occasionally  Skin: Negative for rash.  Neurological: Negative for headaches.  Hematological: Negative for adenopathy.  Psychiatric/Behavioral:       Denies depression/anxiety   Past Medical History  Diagnosis Date  . Arthritis   . History of chicken pox     Social History   Social History  . Marital Status: Married    Spouse Name: N/A  . Number of Children: N/A  . Years of Education: N/A   Occupational History  . Not on file.   Social History Main Topics  . Smoking status: Current Every Day Smoker  . Smokeless tobacco: Not on file  . Alcohol Use: 8.4 oz/week    14 Glasses of  wine per week  . Drug Use: No  . Sexual Activity: Not on file   Other Topics Concern  . 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 Joint replacement      R hip  . Cholecystectomy  1991  . Ovarian cyst removal  1990  . Cesarean section  1995  . Cesarean section  1997  . Total hip arthroplasty  2011    Family History  Problem Relation Age of Onset  . Alcohol abuse Father     deceased  . Cancer Father     prostate, basal cell   . Arthritis Father   . Heart disease Father   . Arthritis Paternal Grandfather   . Heart disease Paternal Grandfather   . Heart disease Maternal Grandfather     No Known Allergies  No current outpatient prescriptions on file prior to visit.   No current facility-administered medications on file prior to visit.    BP 154/77 mmHg  Pulse 67  Temp(Src) 97.9 F (36.6 C) (Oral)  Resp 16  Ht 5\' 10"  (1.778 m)  Wt 176 lb (79.833 kg)  BMI 25.25 kg/m2  SpO2 100%        Objective:   Physical Exam Physical Exam  Constitutional: She is oriented to person, place, and time. She appears well-developed and well-nourished. No distress.  HENT:  Head: Normocephalic and atraumatic.  Right Ear: Tympanic membrane and ear canal normal.  Left Ear: Tympanic membrane and ear canal normal.  Mouth/Throat: Oropharynx is clear and moist.  Eyes: Pupils are equal, round, and reactive to light. No scleral icterus.  Neck: Normal range of motion. No thyromegaly present.  Cardiovascular: Normal rate and regular rhythm.   No murmur heard. Pulmonary/Chest: Effort normal and breath sounds normal. No respiratory distress. He has no wheezes. She has no rales. She exhibits no tenderness.  Abdominal: Soft. Bowel sounds are normal. He exhibits no distension and no  mass. There is no tenderness. There is no rebound and no guarding.  Musculoskeletal: She exhibits no edema. Bilateral UE strength is 5/5, bilateral equal hand grasps Lymphadenopathy:    She has no cervical adenopathy.  Neurological: She is alert and oriented to person, place, and time. She has normal patellar reflexes. She exhibits normal muscle tone. Coordination normal.  Skin: Skin is warm and dry.  Psychiatric: She has a normal mood and affect. Her behavior is normal. Judgment and thought content normal.  Breasts: Examined lying Right: Without masses, retractions, discharge or axillary adenopathy.  Left: Without masses, retractions, discharge or axillary adenopathy.         Assessment & Plan:          Assessment & Plan:   BP Readings from Last 3 Encounters:  01/28/15 154/77  12/18/13 122/78  08/21/13 124/80   EKG tracing is personally reviewed.  EKG notes NSR.  No acute changes.

## 2015-01-30 ENCOUNTER — Encounter: Payer: Self-pay | Admitting: Family

## 2015-01-30 DIAGNOSIS — Z0000 Encounter for general adult medical examination without abnormal findings: Secondary | ICD-10-CM | POA: Insufficient documentation

## 2015-01-30 DIAGNOSIS — M542 Cervicalgia: Secondary | ICD-10-CM | POA: Insufficient documentation

## 2015-01-30 HISTORY — DX: Cervicalgia: M54.2

## 2015-01-30 HISTORY — DX: Encounter for general adult medical examination without abnormal findings: Z00.00

## 2015-01-30 NOTE — Assessment & Plan Note (Signed)
Encouraged healthier diet,  Continue regular exercise, refer for mammogram, colo, dexa.  Discussed smoking cessation.

## 2015-01-30 NOTE — Assessment & Plan Note (Signed)
+   cervical radiculopathy. Advised pt on trial of meloxicam, if symptoms worsen or do not improve, consider MRI.

## 2015-02-13 ENCOUNTER — Encounter: Payer: Self-pay | Admitting: Family

## 2015-02-14 MED ORDER — MELOXICAM 7.5 MG PO TABS: 7.5000 mg | ORAL_TABLET | Freq: Every day | ORAL | Status: DC

## 2015-03-04 ENCOUNTER — Ambulatory Visit (HOSPITAL_BASED_OUTPATIENT_CLINIC_OR_DEPARTMENT_OTHER)
Admission: RE | Admit: 2015-03-04 | Discharge: 2015-03-04 | Disposition: A | Discharge status: Home / Self Care | Payer: BLUE CROSS/BLUE SHIELD | Source: Ambulatory Visit | Attending: Family | Admitting: Family

## 2015-03-04 ENCOUNTER — Encounter: Payer: Self-pay | Admitting: Family

## 2015-03-04 ENCOUNTER — Ambulatory Visit: Payer: BLUE CROSS/BLUE SHIELD | Admitting: Family Medicine

## 2015-03-04 ENCOUNTER — Ambulatory Visit (INDEPENDENT_AMBULATORY_CARE_PROVIDER_SITE_OTHER): Payer: BLUE CROSS/BLUE SHIELD | Admitting: Family

## 2015-03-04 VITALS — BP 144/85 | HR 73 | Temp 98.7°F | Resp 16 | Ht 70.0 in | Wt 174.0 lb

## 2015-03-04 DIAGNOSIS — E2839 Other primary ovarian failure: Secondary | ICD-10-CM | POA: Diagnosis not present

## 2015-03-04 DIAGNOSIS — M8580 Other specified disorders of bone density and structure, unspecified site: Secondary | ICD-10-CM | POA: Insufficient documentation

## 2015-03-04 DIAGNOSIS — Z1231 Encounter for screening mammogram for malignant neoplasm of breast: Secondary | ICD-10-CM | POA: Diagnosis not present

## 2015-03-04 DIAGNOSIS — F17200 Nicotine dependence, unspecified, uncomplicated: Secondary | ICD-10-CM | POA: Insufficient documentation

## 2015-03-04 DIAGNOSIS — Z780 Asymptomatic menopausal state: Secondary | ICD-10-CM | POA: Insufficient documentation

## 2015-03-04 DIAGNOSIS — I10 Essential (primary) hypertension: Secondary | ICD-10-CM

## 2015-03-04 DIAGNOSIS — Z0000 Encounter for general adult medical examination without abnormal findings: Secondary | ICD-10-CM | POA: Diagnosis not present

## 2015-03-04 DIAGNOSIS — M542 Cervicalgia: Secondary | ICD-10-CM

## 2015-03-04 DIAGNOSIS — Z13820 Encounter for screening for osteoporosis: Secondary | ICD-10-CM | POA: Insufficient documentation

## 2015-03-04 MED ORDER — VARENICLINE TARTRATE 0.5 MG X 11 & 1 MG X 42 PO MISC: ORAL | Status: DC

## 2015-03-04 NOTE — Progress Notes (Signed)
Pre visit review using our clinic review tool, if applicable. No additional management support is needed unless otherwise documented below in the visit note. 

## 2015-03-04 NOTE — Progress Notes (Signed)
Subjective:    Patient ID: Lauren Walters, female    DOB: Mar 12, 1960, 55 y.o.   MRN: [id number]  HPI  [name] is a 55 yr old female who presents today for follow up.  1) Neck pain- was given trial of meloxicam 1 month ago. Right radicular pain is not as bad.  Also has some tingling in the right foot while driving. Still has some aching.  2) Elevation of BP- has cut down on her wine consumption, daily wine consumption. BP last week 141/72 at home.   BP Readings from Last 3 Encounters:  03/04/15 144/85  01/28/15 154/77  12/18/13 122/78     Review of Systems See HPI  Past Medical History  Diagnosis Date  . Arthritis   . History of chicken pox     Social History   Social History  . Marital Status: Married    Spouse Name: N/A  . Number of Children: N/A  . Years of Education: N/A   Occupational History  . Not on file.   Social History Main Topics  . Smoking status: Current Every Day Smoker  . Smokeless tobacco: Not on file  . Alcohol Use: 8.4 oz/week    14 Glasses of wine per week  . Drug Use: No  . Sexual Activity: Not on file   Other Topics Concern  . 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 Joint replacement      R hip  . Cholecystectomy  1991  . Ovarian cyst removal  1990  . Cesarean section  1995  . Cesarean section  1997  . Total hip arthroplasty  2011    Family History  Problem Relation Age of Onset  . Alcohol abuse Father     deceased  . Cancer Father     prostate, basal cell   . Arthritis Father   . Heart disease Father   . Arthritis Paternal Grandfather   . Heart disease Paternal Grandfather   . Heart disease Maternal Grandfather     No Known Allergies  Current Outpatient Prescriptions on File Prior to Visit    Medication Sig Dispense Refill  . estradiol (ESTRING) 2 MG vaginal ring PLACE 2 MG VAGINALLY EVERY 3 (THREE) MONTHS. FOLLOW PACKAGE DIRECTIONS 1 each 3  . meloxicam (MOBIC) 7.5 MG tablet Take 1 tablet (7.5 mg total) by mouth daily. 30 tablet 0   No current facility-administered medications on file prior to visit.    BP 144/85 mmHg  Pulse 73  Temp(Src) 98.7 F (37.1 C) (Oral)  Resp 16  Ht 5\' 10"  (1.778 m)  Wt 174 lb (78.926 kg)  BMI 24.97 kg/m2  SpO2 99%       Objective:   Physical Exam  Constitutional: She appears well-developed and well-nourished.  Cardiovascular: Normal rate, regular rhythm and normal heart sounds.   No murmur heard. Pulmonary/Chest: Effort normal and breath sounds normal. No respiratory distress. She has no wheezes.  Psychiatric: She has a normal mood and affect. Her behavior is normal. Judgment and thought content normal.          Assessment & Plan:  15 min spent with pt today. >50% of this time was spent counseling pt on weight  loss, diet, HTN.

## 2015-03-04 NOTE — Patient Instructions (Addendum)
You will be contacted about your referral to physical therapy. Please continue your work with healthy low sodium diet.

## 2015-03-05 DIAGNOSIS — I10 Essential (primary) hypertension: Secondary | ICD-10-CM | POA: Insufficient documentation

## 2015-03-05 HISTORY — DX: Essential (primary) hypertension: I10

## 2015-03-05 NOTE — Assessment & Plan Note (Signed)
Rx provided for chantix- she is preparing to quit smoking.

## 2015-03-05 NOTE — Assessment & Plan Note (Signed)
We discussed referral for MRI. She declines at this time, prefers referral to PT- referral placed.

## 2015-03-05 NOTE — Assessment & Plan Note (Signed)
Improving, I think she would benefit from low dose anti-hypertensive, but she declines at this time. Wishes to continue to work on diet, exercise, weight loss.

## 2015-03-08 ENCOUNTER — Other Ambulatory Visit: Payer: Self-pay | Admitting: Family

## 2015-03-08 ENCOUNTER — Telehealth: Payer: Self-pay | Admitting: Family

## 2015-03-08 DIAGNOSIS — M8580 Other specified disorders of bone density and structure, unspecified site: Secondary | ICD-10-CM | POA: Insufficient documentation

## 2015-03-08 MED ORDER — CALCIUM CARBONATE-VITAMIN D 600-400 MG-UNIT PO TABS: 1.0000 | ORAL_TABLET | Freq: Two times a day (BID) | ORAL | Status: DC

## 2015-03-08 NOTE — Telephone Encounter (Signed)
Notified pt and she voices understanding. 

## 2015-03-08 NOTE — Telephone Encounter (Signed)
Reviewed bone density- shows mild bone thinning, osteopenia.  I recommend she start calcium in the form of of caltrate 600mg  + D one tablet by mouth twice daily. This is available over the counter.  In addition, please ensure regular weight bearing exercise such as walking.

## 2015-03-13 ENCOUNTER — Other Ambulatory Visit: Payer: Self-pay | Admitting: Family

## 2015-03-14 NOTE — Telephone Encounter (Signed)
Pt has f/u 06/03/15. Please advise Meloxicam refills?

## 2015-03-24 ENCOUNTER — Ambulatory Visit: Payer: BLUE CROSS/BLUE SHIELD | Attending: Family | Admitting: Physical Therapy

## 2015-03-24 DIAGNOSIS — M542 Cervicalgia: Secondary | ICD-10-CM

## 2015-03-24 DIAGNOSIS — M546 Pain in thoracic spine: Secondary | ICD-10-CM | POA: Diagnosis present

## 2015-03-24 DIAGNOSIS — R202 Paresthesia of skin: Secondary | ICD-10-CM | POA: Diagnosis present

## 2015-03-24 DIAGNOSIS — G8929 Other chronic pain: Secondary | ICD-10-CM | POA: Diagnosis present
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
## 2015-03-30 HISTORY — PX: COLONOSCOPY: SHX174

## 2015-03-31 ENCOUNTER — Ambulatory Visit: Payer: BLUE CROSS/BLUE SHIELD | Attending: Family | Admitting: Physical Therapy

## 2015-03-31 DIAGNOSIS — M542 Cervicalgia: Secondary | ICD-10-CM | POA: Diagnosis not present

## 2015-03-31 DIAGNOSIS — G8929 Other chronic pain: Secondary | ICD-10-CM | POA: Insufficient documentation

## 2015-03-31 DIAGNOSIS — M546 Pain in thoracic spine: Secondary | ICD-10-CM | POA: Diagnosis present

## 2015-03-31 DIAGNOSIS — R202 Paresthesia of skin: Secondary | ICD-10-CM | POA: Diagnosis present

## 2015-03-31 NOTE — Therapy (Signed)
[facility] [address]  [place], [place], [address] Phone: [phone]   Fax:  [phone]  Physical Therapy Treatment  Patient Details  Name: Lauren Walters MRN: [id number] Date of Birth: [birth year] Referring Provider: Debbrah Alar, NP  Encounter Date: 03/31/2015      PT End of Session - 03/31/15 1347    Visit Number 2   Number of Visits 12   Date for PT Re-Evaluation 05/05/15   PT Start Time 1302   PT Stop Time [id number]   PT Time Calculation (min) 45 min   Activity Tolerance Patient tolerated treatment well   Behavior During Therapy [facility] for tasks assessed/performed      Past Medical History  Diagnosis Date  . Arthritis   . History of chicken pox     Past Surgical History  Procedure Laterality Date  . Joint replacement      R hip  . Cholecystectomy  1991  . Ovarian cyst removal  1990  . Cesarean section  1995  . Cesarean section  1997  . Total hip arthroplasty  2011    There were no vitals filed for this visit.  Visit Diagnosis:  Neck pain of over 3 months duration  Paresthesia of right arm  Midline thoracic back pain      Subjective Assessment - 03/31/15 1306    Subjective Reports pain in upper thoracic spine.     Patient Stated Goals "Reduce pain and tingling"   Currently in Pain? Yes   Pain Score 3    Pain Location Neck  upper back   Pain Orientation Upper   Pain Descriptors / Indicators Aching   Pain Type Chronic pain   Pain Radiating Towards occasional tingling sensation RUE and L lateral foot   Pain Onset More than a month ago   Pain Frequency Constant   Aggravating Factors  worse as day progresses, sitting on hard seat (bleachers)   Pain Relieving Factors heat, massage            [facility] PT Assessment - 03/31/15 1336    AROM   Cervical - Right Rotation 53   Cervical - Left Rotation 50                     [facility] Adult PT Treatment/Exercise - 03/31/15  1308    Exercises   Exercises Neck;Shoulder   Neck Exercises: Machines for Strengthening   UBE (Upper Arm Bike) L 2.5 x 6 min (3' forward/3' backward)   Neck Exercises: Seated   Neck Retraction 10 reps;3 secs   Other Seated Exercise scap retraction yellow theraband x 15   Shoulder Exercises: Supine   Horizontal ABduction Both;10 reps;Theraband   Theraband Level (Shoulder Horizontal ABduction) Level 1 (Yellow)   Horizontal ABduction Limitations on foam roll   External Rotation Both;10 reps;Theraband   Theraband Level (Shoulder External Rotation) Level 1 (Yellow)   External Rotation Limitations on foam roll   Manual Therapy   Manual Therapy Manual Traction;Passive ROM;Soft tissue mobilization   Soft tissue mobilization sub occipital release; trigger point release to upper trap and levator scapula on R   Passive ROM cervical rotation and side bending both directions   Manual Traction 3x30 sec hold cervical traction   Neck Exercises: Stretches   Upper Trapezius Stretch 2 reps;30 seconds   Upper Trapezius Stretch Limitations bil   Levator Stretch 2 reps;30 seconds   Levator Stretch Limitations bil   Chest Stretch  3 reps;30 seconds   Chest Stretch Limitations in doorway   Other Neck Stretches supine on 6" foam roll x 2 min                  PT Short Term Goals - 03/31/15 1358    PT SHORT TERM GOAL #1   Title Independent with inital HEP by 04/14/15   Status Achieved   PT SHORT TERM GOAL #2   Title Pt will demonstrate improved awareness of neutral cervical and shoulder posture by 04/14/15   Status On-going           PT Long Term Goals - 03/31/15 1358    PT LONG TERM GOAL #1   Title Independent with latest HEP by 05/05/15   Status On-going   PT LONG TERM GOAL #2   Title Pt will routinely demonstrate neutral cervical and shoulder posture w/o cues by 05/05/15   Status On-going   PT LONG TERM GOAL #3   Title Pt will demonstrate increased cervical ROM by at least 10 dg in  all planes by 05/05/15   Status On-going   PT LONG TERM GOAL #4   Title Pt will report ability to drive w/o limitation due to pain or restricted neck ROM by 05/05/15   Status On-going               Plan - 03/31/15 1357    Clinical Impression Statement Pt still reports 3/10 pain, but compliant with exercises and rotation improved both directions.  Will continue to benefit from PT to maximize function and decrease pain.Marland Kitchen   PT Next Visit Plan Review HEP, Cervical ROM/flexibilty, Postural training, Manual therapy & Modalities PRN   Consulted and Agree with Plan of Care Patient        Problem List Patient Active Problem List   Diagnosis Date Noted  . Osteopenia 03/08/2015  . HTN (hypertension) 03/05/2015  . Neck pain 01/30/2015  . Preventative health care 01/30/2015  . Physical exam 12/18/2013  . Tobacco use disorder 07/17/2013  . Postmenopausal HRT (hormone replacement therapy) 07/17/2013  . Skin tag 04/24/2011  . Varicose veins 04/24/2011  . DEGENERATIVE JOINT DISEASE, RIGHT HIP 11/11/2008  . HTLVTYPE I CCE & UNS SITE 07/08/2007   Laureen Abrahams, PT, DPT 03/31/2015 1:59 PM  [facility] [address]  [place], [place], [address] Phone: [phone]   Fax:  [phone]  Name: Lauren Walters MRN: [id number] Date of Birth: 1960-04-29

## 2015-03-31 NOTE — Patient Instructions (Signed)
Ear / Shoulder Stretch    Exhaling, move left ear toward left shoulder. Hold position for _20-30__ seconds. Inhaling, bring head back to center. Repeat to other side. Repeat sequence _2__ times. Do _2-3__ times per day.  Copyright  VHI. All rights reserved.    Levator Scapula Stretch, Sitting    Sit, one hand tucked under hip on side to be stretched, other hand over top of head. Turn head toward other side and look down. Use hand on head to gently stretch neck in that position. Hold _20-30__ seconds. Repeat _2__ times per session. Do _2-3__ sessions per day.  Copyright  VHI. All rights reserved.

## 2015-04-02 ENCOUNTER — Encounter: Payer: Self-pay | Admitting: Family

## 2015-04-04 ENCOUNTER — Ambulatory Visit: Payer: BLUE CROSS/BLUE SHIELD | Admitting: Physical Therapy

## 2015-04-04 DIAGNOSIS — R202 Paresthesia of skin: Secondary | ICD-10-CM

## 2015-04-04 DIAGNOSIS — M546 Pain in thoracic spine: Secondary | ICD-10-CM

## 2015-04-04 DIAGNOSIS — M542 Cervicalgia: Secondary | ICD-10-CM | POA: Diagnosis not present

## 2015-04-04 DIAGNOSIS — G8929 Other chronic pain: Principal | ICD-10-CM

## 2015-04-04 NOTE — Therapy (Signed)
[facility] [address]  [place], [place], [address] Phone: [phone]   Fax:  [phone]  Physical Therapy Treatment  Patient Details  Name: KAI KENNON MRN: [id number] Date of Birth: June 19, 1960 Referring Provider: Debbrah Alar, NP  Encounter Date: 04/04/2015      PT End of Session - 04/04/15 0805    Visit Number 3   Number of Visits 12   Date for PT Re-Evaluation 05/05/15   PT Start Time 0800   PT Stop Time 0901   PT Time Calculation (min) 61 min   Activity Tolerance Patient tolerated treatment well   Behavior During Therapy [facility] for tasks assessed/performed      Past Medical History  Diagnosis Date  . Arthritis   . History of chicken pox     Past Surgical History  Procedure Laterality Date  . Joint replacement      R hip  . Cholecystectomy  1991  . Ovarian cyst removal  1990  . Cesarean section  1995  . Cesarean section  1997  . Total hip arthroplasty  2011    There were no vitals filed for this visit.  Visit Diagnosis:  Neck pain of over 3 months duration  Paresthesia of right arm  Midline thoracic back pain      Subjective Assessment - 04/04/15 0803    Subjective Reports pain less in upper back, now more focused in lower neck.   Currently in Pain? Yes   Pain Score 3    Pain Location Neck   Pain Orientation Lower          Today's Treatment  TherEx UBE - lvl 2.5 fwd/back x3" each Hooklying on 6" foam roll:   Chest stretch x2'   B Shoulder Horizontal Abduction with red TB 2x10, cues for pacing and 3" hold at end range of motion   Flexion pullover with 4# 10x5"   B Shoulder ER with red TB 10x3"   B Alternating Shoulder Adduction 2# x10 Standing   B Shoulder rows with red TB 10x3"  B Scapular retraction + Shoulder extension with red TB 10x3"  Manual Manual traction 3x30" in supine STM to B upper trap and levator Grade 2-3 L 1st & 2nd posterior rib mobs in  supine Grade 1-2 T1 & T2 A/P mobs in prone PROM Cervical Rotation and Side-bending with Contract/Relax at end range Cervical isometrics - B Sidebending  Mechanical traction C-Spine - Hooklying with 20 dg pull, 10#/5#, 60"/20" x10'          PT Short Term Goals - 03/31/15 1358    PT SHORT TERM GOAL #1   Title Independent with inital HEP by 04/14/15   Status Achieved   PT SHORT TERM GOAL #2   Title Pt will demonstrate improved awareness of neutral cervical and shoulder posture by 04/14/15   Status On-going           PT Long Term Goals - 03/31/15 1358    PT LONG TERM GOAL #1   Title Independent with latest HEP by 05/05/15   Status On-going   PT LONG TERM GOAL #2   Title Pt will routinely demonstrate neutral cervical and shoulder posture w/o cues by 05/05/15   Status On-going   PT LONG TERM GOAL #3   Title Pt will demonstrate increased cervical ROM by at least 10 dg in all planes by 05/05/15   Status On-going   PT LONG TERM GOAL #4  Title Pt will report ability to drive w/o limitation due to pain or restricted neck ROM by 05/05/15   Status On-going               Plan - 04/04/15 0901    Clinical Impression Statement Patient noting decreased pain in thoracic and scapular area with pain more localized to lower cervical spine at present. Left shoulder/scapula remains elevated relative to right today, therefore initiated 1st & 2nd rib mobs with some improvement noted in postural alignment. Given positive response to manual traction, initiated mechanical traction with good initial response.   PT Next Visit Plan Assess response to traction, Cervical ROM/flexibilty, Postural training, Manual therapy & Modalities PRN, C-spine traction if benefit noted   Consulted and Agree with Plan of Care Patient        Problem List Patient Active Problem List   Diagnosis Date Noted  . Osteopenia 03/08/2015  . HTN (hypertension) 03/05/2015  . Neck pain 01/30/2015  . Preventative health  care 01/30/2015  . Physical exam 12/18/2013  . Tobacco use disorder 07/17/2013  . Postmenopausal HRT (hormone replacement therapy) 07/17/2013  . Skin tag 04/24/2011  . Varicose veins 04/24/2011  . DEGENERATIVE JOINT DISEASE, RIGHT HIP 11/11/2008  . HTLVTYPE I CCE & UNS SITE 07/08/2007    Percival Spanish 04/04/2015, 9:11 AM  [facility] [address]  [place], [place], [address] Phone: [phone]   Fax:  [phone]  Name: JAQUAY SLIMAK MRN: [id number] Date of Birth: 1960/10/10

## 2015-04-04 NOTE — Telephone Encounter (Signed)
Yes please

## 2015-04-04 NOTE — Telephone Encounter (Signed)
Lauren Walters-- I have been told that we do not do the EMMI letters any longer. Staff unsure about an access code. Do we just need to refer her to a smoking cessation work shop?

## 2015-04-08 ENCOUNTER — Encounter: Payer: Self-pay | Admitting: Physical Therapy

## 2015-04-12 ENCOUNTER — Ambulatory Visit: Payer: BLUE CROSS/BLUE SHIELD | Admitting: Physical Therapy

## 2015-04-12 DIAGNOSIS — M542 Cervicalgia: Secondary | ICD-10-CM | POA: Diagnosis not present

## 2015-04-12 DIAGNOSIS — M546 Pain in thoracic spine: Secondary | ICD-10-CM

## 2015-04-12 DIAGNOSIS — G8929 Other chronic pain: Principal | ICD-10-CM

## 2015-04-12 DIAGNOSIS — R202 Paresthesia of skin: Secondary | ICD-10-CM

## 2015-04-12 NOTE — Therapy (Signed)
[facility] [address]  [place], [place], [address] Phone: [phone]   Fax:  [phone]  Physical Therapy Treatment  Patient Details  Name: Lauren Walters MRN: [id number] Date of Birth: [birth year] Referring Provider: Debbrah Alar, NP  Encounter Date: 04/12/2015      PT End of Session - 04/12/15 1710    Visit Number 4   Number of Visits 12   Date for PT Re-Evaluation 05/05/15   PT Start Time 1701   PT Stop Time [id number]   PT Time Calculation (min) 46 min   Activity Tolerance Patient tolerated treatment well   Behavior During Therapy [facility] for tasks assessed/performed      Past Medical History  Diagnosis Date  . Arthritis   . History of chicken pox     Past Surgical History  Procedure Laterality Date  . Joint replacement      R hip  . Cholecystectomy  1991  . Ovarian cyst removal  1990  . Cesarean section  1995  . Cesarean section  1997  . Total hip arthroplasty  2011    There were no vitals filed for this visit.  Visit Diagnosis:  Neck pain of over 3 months duration  Paresthesia of right arm  Midline thoracic back pain      Subjective Assessment - 04/12/15 1706    Subjective Patient reports pain flared up the end of last week with pain returning to the upper back/shoulder blade area without known triggering event. Held off on exercises for a few days and has eased back in with pain better today. States she has obatined a cushion for her chair at work and risers for her computer monitor to help her improve her awarenss of her posture. Reports she slipped and fell in the parking deck on the way in to work this morning.   Currently in Pain? Yes   Pain Score 3    Pain Location Neck   Pain Orientation Lower   Pain Descriptors / Indicators Aching            Today's Treatment  TherEx UBE - lvl 2.5 fwd/back x3" each Hooklying on 6" foam roll:  Chest stretch x2'  B Shoulder  Horizontal Abduction with green TB 2x10, cues for pacing and 3" hold at end range of motion  Flexion pullover with 4# 15x5"   B Alternating Shoulder Flexion + opposite Shoulder Extension diagonals Red TB 2x10  B Shoulder ER with red TB 12x3"  Manual STM and manual stretching to B upper trap and levator Grade 2-3 L 1st & 2nd posterior rib mobs in supine PROM Cervical Rotation and Side-bending with Contract/Relax at end range Cervical isometrics - B Sidebending  TherEx BATCA Low Row 20# 2x10 - emphasis on postural awareness and scapular activation          PT Short Term Goals - 04/12/15 1808    PT SHORT TERM GOAL #1   Title Independent with inital HEP by 04/14/15   Status Achieved   PT SHORT TERM GOAL #2   Title Pt will demonstrate improved awareness of neutral cervical and shoulder posture by 04/14/15   Status Achieved           PT Long Term Goals - 04/12/15 1809    PT LONG TERM GOAL #1   Title Independent with latest HEP by 05/05/15   Status On-going   PT LONG TERM GOAL #2   Title Pt will routinely  demonstrate neutral cervical and shoulder posture w/o cues by 05/05/15   Status On-going   PT LONG TERM GOAL #3   Title Pt will demonstrate increased cervical ROM by at least 10 dg in all planes by 05/05/15   Status On-going   PT LONG TERM GOAL #4   Title Pt will report ability to drive w/o limitation due to pain or restricted neck ROM by 05/05/15   Status On-going               Plan - 04/12/15 1802    Clinical Impression Statement Patient not noting any appreciable change from cervical traction at last visit therefore deferred today. Patient demonstrating improving shoulder postural alignment with decreased elevation on right and less forward shoulder posture with decreasing tension in pectoralis muscles. Limited progression of exercises today and deferred HEP update due to overall soreness from fall this morning.     PT Next Visit Plan Cervical ROM/flexibilty, Postural  training, Manual therapy & Modalities PRN, ROM check   Consulted and Agree with Plan of Care Patient        Problem List Patient Active Problem List   Diagnosis Date Noted  . Osteopenia 03/08/2015  . HTN (hypertension) 03/05/2015  . Neck pain 01/30/2015  . Preventative health care 01/30/2015  . Physical exam 12/18/2013  . Tobacco use disorder 07/17/2013  . Postmenopausal HRT (hormone replacement therapy) 07/17/2013  . Skin tag 04/24/2011  . Varicose veins 04/24/2011  . DEGENERATIVE JOINT DISEASE, RIGHT HIP 11/11/2008  . HTLVTYPE I CCE & UNS SITE 07/08/2007    Percival Spanish, PT, MPT 04/12/2015, 6:12 PM  [facility] [address]  [place], [place], [address] Phone: [phone]   Fax:  [phone]  Name: Lauren Walters MRN: [id number] Date of Birth: 1961-01-30

## 2015-04-15 ENCOUNTER — Ambulatory Visit: Payer: BLUE CROSS/BLUE SHIELD | Admitting: Physical Therapy

## 2015-04-15 DIAGNOSIS — M542 Cervicalgia: Secondary | ICD-10-CM

## 2015-04-15 DIAGNOSIS — M546 Pain in thoracic spine: Secondary | ICD-10-CM

## 2015-04-15 DIAGNOSIS — R202 Paresthesia of skin: Secondary | ICD-10-CM

## 2015-04-15 DIAGNOSIS — G8929 Other chronic pain: Principal | ICD-10-CM

## 2015-04-15 NOTE — Therapy (Signed)
[facility] [address]  [place], [place], [address] Phone: [phone]   Fax:  [phone]  Physical Therapy Treatment  Patient Details  Name: Lauren Walters MRN: [id number] Date of Birth: 1960/09/22 Referring Provider: Debbrah Alar, NP  Encounter Date: 04/15/2015      PT End of Session - 04/15/15 0851    Visit Number 5   Number of Visits 12   Date for PT Re-Evaluation 05/05/15   PT Start Time 0847   PT Stop Time 0928   PT Time Calculation (min) 41 min   Activity Tolerance Patient tolerated treatment well   Behavior During Therapy [facility] for tasks assessed/performed      Past Medical History  Diagnosis Date  . Arthritis   . History of chicken pox     Past Surgical History  Procedure Laterality Date  . Joint replacement      R hip  . Cholecystectomy  1991  . Ovarian cyst removal  1990  . Cesarean section  1995  . Cesarean section  1997  . Total hip arthroplasty  2011    There were no vitals filed for this visit.  Visit Diagnosis:  Neck pain of over 3 months duration  Paresthesia of right arm  Midline thoracic back pain      Subjective Assessment - 04/15/15 0850    Subjective Patient reporting pain along shoulder blade is better but still feeling pain/tightness along right medial thoracic spine. Worked from home yesterday and thinks posture is not as good as when in work chair.   Currently in Pain? Yes   Pain Score 3    Pain Location Thoracic   Pain Orientation Right;Upper;Medial   Pain Descriptors / Indicators Aching            [facility] PT Assessment - 04/15/15 0847    ROM / Strength   AROM / PROM / Strength AROM   AROM   AROM Assessment Site Cervical   Cervical Flexion 35   Cervical Extension 42   Cervical - Right Side Bend 18   Cervical - Left Side Bend 16   Cervical - Right Rotation 56   Cervical - Left Rotation 59         Today's Treatment  Cervical ROM  check  TherEx UBE - lvl 2.5 fwd/back x3" each  Manual STM and manual stretching to B upper trap and levator Grade 2-3 L 1st & 2nd posterior rib mobs in supine PROM Cervical Flexion & Extension PROM Cervical Rotation and Side-bending with Contract/Relax at end range Cervical isometrics - B Sidebending Grade 1-2 T1 & T2 A/P mobs in prone  Kinesiotape to R Levator - "Y" (30%) from medial scapular border with 1 tail to C2 TP & other tail over mid upper trap to mid clavicle            PT Short Term Goals - 04/12/15 1808    PT SHORT TERM GOAL #1   Title Independent with inital HEP by 04/14/15   Status Achieved   PT SHORT TERM GOAL #2   Title Pt will demonstrate improved awareness of neutral cervical and shoulder posture by 04/14/15   Status Achieved           PT Long Term Goals - 04/12/15 1809    PT LONG TERM GOAL #1   Title Independent with latest HEP by 05/05/15   Status On-going   PT LONG TERM GOAL #2   Title  Pt will routinely demonstrate neutral cervical and shoulder posture w/o cues by 05/05/15   Status On-going   PT LONG TERM GOAL #3   Title Pt will demonstrate increased cervical ROM by at least 10 dg in all planes by 05/05/15   Status On-going   PT LONG TERM GOAL #4   Title Pt will report ability to drive w/o limitation due to pain or restricted neck ROM by 05/05/15   Status On-going               Plan - 04/15/15 1159    Clinical Impression Statement Patient demonstrating only limited improvements in cervical AROM despite significantly improved PROM by therapist during treatment session. Pt continues to complain mostly of right midline upper thoracic pain and medial scapular border pain, therefore interventions focused primariliy on manual therapy and trial of kinesiotape along R levator initaited and may consider central  thoracic taping if pain persists at next visit.   PT Next Visit Plan Cervical ROM/flexibilty, Postural training, Manual therapy & Modalities PRN    Consulted and Agree with Plan of Care Patient        Problem List Patient Active Problem List   Diagnosis Date Noted  . Osteopenia 03/08/2015  . HTN (hypertension) 03/05/2015  . Neck pain 01/30/2015  . Preventative health care 01/30/2015  . Physical exam 12/18/2013  . Tobacco use disorder 07/17/2013  . Postmenopausal HRT (hormone replacement therapy) 07/17/2013  . Skin tag 04/24/2011  . Varicose veins 04/24/2011  . DEGENERATIVE JOINT DISEASE, RIGHT HIP 11/11/2008  . HTLVTYPE I CCE & UNS SITE 07/08/2007    Percival Spanish, PT, MPT 04/15/2015, 12:16 PM  [facility] [address]  [place], [place], [address] Phone: [phone]   Fax:  [phone]  Name: Lauren Walters MRN: [id number] Date of Birth: [birth year]

## 2015-04-16 ENCOUNTER — Other Ambulatory Visit: Payer: Self-pay | Admitting: Family

## 2015-04-19 ENCOUNTER — Ambulatory Visit: Payer: BLUE CROSS/BLUE SHIELD | Admitting: Physical Therapy

## 2015-04-19 DIAGNOSIS — M542 Cervicalgia: Secondary | ICD-10-CM

## 2015-04-19 DIAGNOSIS — M546 Pain in thoracic spine: Secondary | ICD-10-CM

## 2015-04-19 DIAGNOSIS — R202 Paresthesia of skin: Secondary | ICD-10-CM

## 2015-04-19 DIAGNOSIS — G8929 Other chronic pain: Principal | ICD-10-CM

## 2015-04-19 NOTE — Therapy (Addendum)
[facility] [address]  [place], [place], [address] Phone: [phone]   Fax:  [phone]  Physical Therapy Treatment  Patient Details  Name: Lauren Walters MRN: [id number] Date of Birth: 1960-08-14 Referring Provider: Debbrah Alar, NP  Encounter Date: 04/19/2015      PT End of Session - 04/19/15 1752    Visit Number 6   Number of Visits 12   Date for PT Re-Evaluation 05/05/15   PT Start Time 1706  Pt arrived late, then needed to use BR   PT Stop Time 1750   PT Time Calculation (min) 44 min   Activity Tolerance Patient tolerated treatment well   Behavior During Therapy [facility] for tasks assessed/performed      Past Medical History  Diagnosis Date  . Arthritis   . History of chicken pox     Past Surgical History  Procedure Laterality Date  . Joint replacement      R hip  . Cholecystectomy  1991  . Ovarian cyst removal  1990  . Cesarean section  1995  . Cesarean section  1997  . Total hip arthroplasty  2011    There were no vitals filed for this visit.  Visit Diagnosis:  Neck pain of over 3 months duration  Paresthesia of right arm  Midline thoracic back pain      Subjective Assessment - 04/19/15 1710    Subjective Patient reports taping "was golden" with complete relief of medial scapula pain until Monday, although still with central lower neck pain.   Currently in Pain? Yes   Pain Score 2            Today's Treatment  TherEx UBE - lvl 2.5 fwd/back x3" each Hooklying on 6" foam roll:  Chest stretch x2'  B Shoulder Horizontal Abduction with green TB 2x10, cues for pacing and 3" hold at end range of motion  B Alternating Shoulder Flexion + opposite Shoulder Extension diagonals green TB 2x10x3"  B Shoulder ER with green TB 20x3"   B Flexion pullover with 4# 20x3'  Manual STM and manual stretching to B upper trap and levator Grade 2-3 L 1st & 2nd posterior rib mobs in  supine PROM Cervical Flexion & Extension PROM Cervical Rotation and Side-bending with Contract/Relax at end range Cervical isometrics - B Sidebending Grade 1-2 T1 & T2 A/P mobs in prone  Kinesiotape to R Levator - "Y" (30%) from T4 to B cervical paraspinals, 2 strips (30%) from occiput to B medial scapular border, 1 strip (70%) horizontal along C/T junction to B upper trap          PT Short Term Goals - 04/12/15 1808    PT SHORT TERM GOAL #1   Title Independent with inital HEP by 04/14/15   Status Achieved   PT SHORT TERM GOAL #2   Title Pt will demonstrate improved awareness of neutral cervical and shoulder posture by 04/14/15   Status Achieved           PT Long Term Goals - 04/19/15 1804    PT LONG TERM GOAL #1   Title Independent with latest HEP by 05/05/15   Status On-going   PT LONG TERM GOAL #2   Title Pt will routinely demonstrate neutral cervical and shoulder posture w/o cues by 05/05/15   Status On-going   PT LONG TERM GOAL #3   Title Pt will demonstrate increased cervical ROM by at least 10 dg in  all planes by 05/05/15   Status On-going   PT LONG TERM GOAL #4   Title Pt will report ability to drive w/o limitation due to pain or restricted neck ROM by 05/05/15   Status On-going               Plan - 04/19/15 1759    Clinical Impression Statement Patient noting positive response to taping for R levator with complete resolution of medial scapular pain until yesterday and decreased intensity of pain upon return of pain. Pain today more midline at C/T junction with some slight pain along R medial scapular border, therefore taping reapplied after manual therapy for cervical strain with tails extending to medial scapular border.    PT Next Visit Plan Cervical ROM/flexibilty, Postural training, Manual therapy, taping & Modalities PRN   Consulted and Agree with Plan of Care Patient        Problem List Patient Active Problem List   Diagnosis Date Noted  . Osteopenia  03/08/2015  . HTN (hypertension) 03/05/2015  . Neck pain 01/30/2015  . Preventative health care 01/30/2015  . Physical exam 12/18/2013  . Tobacco use disorder 07/17/2013  . Postmenopausal HRT (hormone replacement therapy) 07/17/2013  . Skin tag 04/24/2011  . Varicose veins 04/24/2011  . DEGENERATIVE JOINT DISEASE, RIGHT HIP 11/11/2008  . HTLVTYPE I CCE & UNS SITE 07/08/2007    Percival Spanish, PT, MPT 04/19/2015, 6:07 PM  [facility] [address]  [place], [place], [address] Phone: [phone]   Fax:  [phone]  Name: Lauren Walters MRN: [id number] Date of Birth: 1960-09-17

## 2015-04-19 NOTE — Therapy (Deleted)
[facility] [address]  [place], [place], [address] Phone: [phone]   Fax:  [phone]  Physical Therapy Treatment  Patient Details  Name: Lauren Walters MRN: [id number] Date of Birth: 04-30-1960 Referring Provider: Debbrah Alar, NP  Encounter Date: 04/19/2015    Past Medical History  Diagnosis Date  . Arthritis   . History of chicken pox     Past Surgical History  Procedure Laterality Date  . Joint replacement      R hip  . Cholecystectomy  1991  . Ovarian cyst removal  1990  . Cesarean section  1995  . Cesarean section  1997  . Total hip arthroplasty  2011    There were no vitals filed for this visit.  Visit Diagnosis:  No diagnosis found.      Subjective Assessment - 04/19/15 1710    Subjective Patient reports taping "was golden" with complete relief of medial scapula pain until Monday, although still with central lower neck pain.   Currently in Pain? Yes   Pain Score 2           Today's Treatment  TherEx Hooklying on 6" foam roll:   Chest stretch x2'   B Shoulder Horizontal Abduction with green TB 2x10, cues for pacing and 3" hold at end range of motion   B Alternating Shoulder Flexion + opposite Shoulder Extension diagonals Green TB 2x10   B Shoulder ER with Green TB 20x3"   Flexion pullover with 4# 20x3"  Manual  STM and manual stretching to cervical paraspinals, B upper trap and levator Grade 2-3 L 1st & 2nd posterior rib mobs in supine PROM Cervical Flexion & Extension PROM Cervical Rotation and Side-bending with Contract/Relax at end range Cervical isometrics - B Sidebending Grade 1-2 T1 & T2 A/P mobs in prone  Kinesiotape to Cervical spine - "Y" (30%) from T4 to B neck, 2 strips (30%) in "V" from occiput to B medial scapular border, 1 strip (70%) horizontally at C/T junction           PT Short Term Goals - 04/12/15 1808    PT SHORT TERM GOAL #1   Title  Independent with inital HEP by 04/14/15   Status Achieved   PT SHORT TERM GOAL #2   Title Pt will demonstrate improved awareness of neutral cervical and shoulder posture by 04/14/15   Status Achieved           PT Long Term Goals - 04/12/15 1809    PT LONG TERM GOAL #1   Title Independent with latest HEP by 05/05/15   Status On-going   PT LONG TERM GOAL #2   Title Pt will routinely demonstrate neutral cervical and shoulder posture w/o cues by 05/05/15   Status On-going   PT LONG TERM GOAL #3   Title Pt will demonstrate increased cervical ROM by at least 10 dg in all planes by 05/05/15   Status On-going   PT LONG TERM GOAL #4   Title Pt will report ability to drive w/o limitation due to pain or restricted neck ROM by 05/05/15   Status On-going               Problem List Patient Active Problem List   Diagnosis Date Noted  . Osteopenia 03/08/2015  . HTN (hypertension) 03/05/2015  . Neck pain 01/30/2015  . Preventative health care 01/30/2015  . Physical exam 12/18/2013  . Tobacco use disorder 07/17/2013  .  Postmenopausal HRT (hormone replacement therapy) 07/17/2013  . Skin tag 04/24/2011  . Varicose veins 04/24/2011  . DEGENERATIVE JOINT DISEASE, RIGHT HIP 11/11/2008  . HTLVTYPE I CCE & UNS SITE 07/08/2007    Percival Spanish, PT, MPT 04/19/2015, 5:14 PM  [facility] [address]  [place], [place], [address] Phone: [phone]   Fax:  [phone]  Name: Lauren Walters MRN: [id number] Date of Birth: Jul 01, 1960

## 2015-04-21 ENCOUNTER — Ambulatory Visit: Payer: BLUE CROSS/BLUE SHIELD | Admitting: Physical Therapy

## 2015-04-21 DIAGNOSIS — G8929 Other chronic pain: Principal | ICD-10-CM

## 2015-04-21 DIAGNOSIS — M546 Pain in thoracic spine: Secondary | ICD-10-CM

## 2015-04-21 DIAGNOSIS — M542 Cervicalgia: Secondary | ICD-10-CM | POA: Diagnosis not present

## 2015-04-21 NOTE — Therapy (Signed)
[facility] [address]  [place], [place], [address] Phone: [phone]   Fax:  [phone]  Physical Therapy Treatment  Patient Details  Name: Lauren Walters MRN: [id number] Date of Birth: [birth year] Referring Provider: Debbrah Alar, NP  Encounter Date: 04/21/2015      PT End of Session - 04/21/15 1452    Visit Number 7   Number of Visits 12   Date for PT Re-Evaluation 05/05/15   PT Start Time [id number]   PT Stop Time 1530   PT Time Calculation (min) 43 min   Activity Tolerance Patient tolerated treatment well   Behavior During Therapy [facility] for tasks assessed/performed      Past Medical History  Diagnosis Date  . Arthritis   . History of chicken pox     Past Surgical History  Procedure Laterality Date  . Joint replacement      R hip  . Cholecystectomy  1991  . Ovarian cyst removal  1990  . Cesarean section  1995  . Cesarean section  1997  . Total hip arthroplasty  2011    There were no vitals filed for this visit.  Visit Diagnosis:  Neck pain of over 3 months duration  Midline thoracic back pain      Subjective Assessment - 04/21/15 1451    Subjective Patient not noting any more relief with latest taping pattern and maybe even less than the first attempt. Also states the tape has been more itchy this time.   Currently in Pain? Yes   Pain Score 3    Pain Location Thoracic   Pain Orientation Upper   Pain Descriptors / Indicators Aching          Today's Treatment  TherEx UBE - lvl 2.5 fwd/back x3" each  Manual STM and manual stretching to B upper/middle trap and rhomboids Grade 2-3 T1-T4 A/P mobs in prone Grade 2-3 L 2nd posterior rib mobs in prone  TherEx Hooklying on 6" foam roll:  Chest stretch x2'  B Shoulder Horizontal Abduction with green TB 2x10, cues for pacing and 3" hold at end range of motion  B Alternating Shoulder Flexion + opposite Shoulder Extension  diagonals green TB 2x10x3"  B Shoulder ER with green TB 20x3"  Serratus punch 3# x10, Bil   Shoulder circles at 90 dg flexion CW/CCW 3# x10 each, Bil Seated Rhomboid stretch 3x20" Seated Horiz ADD stretch 2x20", Bil Serratus roll-up with green (65cm) Pball x10 Wall push-up x10          PT Short Term Goals - 04/12/15 1808    PT SHORT TERM GOAL #1   Title Independent with inital HEP by 04/14/15   Status Achieved   PT SHORT TERM GOAL #2   Title Pt will demonstrate improved awareness of neutral cervical and shoulder posture by 04/14/15   Status Achieved           PT Long Term Goals - 04/19/15 1804    PT LONG TERM GOAL #1   Title Independent with latest HEP by 05/05/15   Status On-going   PT LONG TERM GOAL #2   Title Pt will routinely demonstrate neutral cervical and shoulder posture w/o cues by 05/05/15   Status On-going   PT LONG TERM GOAL #3   Title Pt will demonstrate increased cervical ROM by at least 10 dg in all planes by 05/05/15   Status On-going   PT LONG TERM GOAL #4  Title Pt will report ability to drive w/o limitation due to pain or restricted neck ROM by 05/05/15   Status On-going               Plan - 04/21/15 1534    Clinical Impression Statement Patient continues to report pain remains centralized in upper thoracic spine, L > R, with no pain, just stiffness, along scapular border. Most recent taping pattern less effective and created increased itching/irritation, therefore taping removed and no new tape applied today. Increased restriction noted with A/P mobs at T2 and T3 today but resolved after repeated mobs, with patient also noting positive response to rib mobs on L.   PT Next Visit Plan Cervical ROM/flexibilty, Postural training, Manual therapy, taping & Modalities PRN   Consulted and Agree with Plan of Care Patient        Problem List Patient Active Problem List   Diagnosis Date Noted  . Osteopenia 03/08/2015  . HTN (hypertension) 03/05/2015   . Neck pain 01/30/2015  . Preventative health care 01/30/2015  . Physical exam 12/18/2013  . Tobacco use disorder 07/17/2013  . Postmenopausal HRT (hormone replacement therapy) 07/17/2013  . Skin tag 04/24/2011  . Varicose veins 04/24/2011  . DEGENERATIVE JOINT DISEASE, RIGHT HIP 11/11/2008  . 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  [place], [place], [address] Phone: [phone]   Fax:  [phone]  Name: Lauren Walters MRN: [id number] Date of Birth: 1960/03/03

## 2015-04-26 ENCOUNTER — Ambulatory Visit: Payer: BLUE CROSS/BLUE SHIELD | Admitting: Physical Therapy

## 2015-04-26 DIAGNOSIS — M542 Cervicalgia: Secondary | ICD-10-CM

## 2015-04-26 DIAGNOSIS — G8929 Other chronic pain: Principal | ICD-10-CM

## 2015-04-26 DIAGNOSIS — R202 Paresthesia of skin: Secondary | ICD-10-CM

## 2015-04-26 DIAGNOSIS — M546 Pain in thoracic spine: Secondary | ICD-10-CM

## 2015-04-26 NOTE — Therapy (Signed)
[facility] [address]  [place], [place], [address] Phone: [phone]   Fax:  [phone]  Physical Therapy Treatment  Patient Details  Name: Lauren Walters MRN: [id number] Date of Birth: 07-04-1960 Referring Provider: Debbrah Alar, NP  Encounter Date: 04/26/2015      PT End of Session - 04/26/15 1712    Visit Number 8   Number of Visits 12   Date for PT Re-Evaluation 05/05/15   PT Start Time [id number]   PT Stop Time 1747   PT Time Calculation (min) 43 min   Activity Tolerance Patient tolerated treatment well   Behavior During Therapy [facility] for tasks assessed/performed      Past Medical History  Diagnosis Date  . Arthritis   . History of chicken pox     Past Surgical History  Procedure Laterality Date  . Joint replacement      R hip  . Cholecystectomy  1991  . Ovarian cyst removal  1990  . Cesarean section  1995  . Cesarean section  1997  . Total hip arthroplasty  2011    There were no vitals filed for this visit.  Visit Diagnosis:  Neck pain of over 3 months duration  Midline thoracic back pain  Paresthesia of right arm      Subjective Assessment - 04/26/15 1709    Subjective Patient noting increased lower thoracic pain up to 4-5/10 over weekend, but able to do stretches/HEP and better today.   Currently in Pain? Yes   Pain Score 1    Pain Location Thoracic   Pain Orientation Lower          Today's Treatment  TherEx UBE - lvl 3.0 fwd/back x3" each  Manual STM and manual stretching to B middle trap and rhomboids, R subscapularis Grade 2-3 T6-T10 A/P mobs in prone  TherEx Hooklying on 6" foam roll:  Chest stretch x2'  B Shoulder Horizontal Abduction with blue TB 2x10, cues for pacing and 3" hold at end range of motion  B Alternating Shoulder Flexion + opposite Shoulder Extension diagonals blue TB 2x10x3"  B Shoulder ER with blue TB 20x3"   B Flexion pullover with  5# 20x3"  B Serratus punch 3# x10 BATCA Low Row 20# 2x10 Serratus roll-up with green (65cm) Pball x10           PT Short Term Goals - 04/12/15 1808    PT SHORT TERM GOAL #1   Title Independent with inital HEP by 04/14/15   Status Achieved   PT SHORT TERM GOAL #2   Title Pt will demonstrate improved awareness of neutral cervical and shoulder posture by 04/14/15   Status Achieved           PT Long Term Goals - 04/26/15 1759    PT LONG TERM GOAL #1   Title Independent with latest HEP by 05/05/15   Status On-going   PT LONG TERM GOAL #2   Title Pt will routinely demonstrate neutral cervical and shoulder posture w/o cues by 05/05/15   Status Achieved   PT LONG TERM GOAL #3   Title Pt will demonstrate increased cervical ROM by at least 10 dg in all planes by 05/05/15   Status On-going   PT LONG TERM GOAL #4   Title Pt will report ability to drive w/o limitation due to pain or restricted neck ROM by 05/05/15   Status On-going  Plan - 04/26/15 1751    Clinical Impression Statement Pain intensity less today with patient now reporting pain more in mid to lower thoracic region. Restriction noted with mid/lower thoracic A/P mobs R > L but improved after repeated mobs. Increased muscle tension noted in R lower trap and R subscapularis which responded positively to STM. Continued focus on scapular training for improved posture.   PT Next Visit Plan Cervical ROM/flexibilty, Postural training, Manual therapy, taping & Modalities PRN   Consulted and Agree with Plan of Care Patient        Problem List Patient Active Problem List   Diagnosis Date Noted  . Osteopenia 03/08/2015  . HTN (hypertension) 03/05/2015  . Neck pain 01/30/2015  . Preventative health care 01/30/2015  . Physical exam 12/18/2013  . Tobacco use disorder 07/17/2013  . Postmenopausal HRT (hormone replacement therapy) 07/17/2013  . Skin tag 04/24/2011  . Varicose veins 04/24/2011  . DEGENERATIVE  JOINT DISEASE, RIGHT HIP 11/11/2008  . 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  [place], [place], [address] Phone: [phone]   Fax:  [phone]  Name: Lauren Walters MRN: [id number] Date of Birth: 1960-07-17

## 2015-04-28 ENCOUNTER — Ambulatory Visit: Payer: BLUE CROSS/BLUE SHIELD | Attending: Family | Admitting: Physical Therapy

## 2015-04-28 DIAGNOSIS — M546 Pain in thoracic spine: Secondary | ICD-10-CM | POA: Diagnosis present

## 2015-04-28 DIAGNOSIS — M542 Cervicalgia: Secondary | ICD-10-CM

## 2015-04-28 DIAGNOSIS — R202 Paresthesia of skin: Secondary | ICD-10-CM | POA: Insufficient documentation

## 2015-04-28 DIAGNOSIS — G8929 Other chronic pain: Secondary | ICD-10-CM | POA: Diagnosis present

## 2015-04-28 NOTE — Therapy (Signed)
[facility] [place] [address]  [place], [place], [address] Phone: [phone]   Fax:  [phone]  Physical Therapy Treatment  Patient Details  Name: Lauren Walters MRN: [id number] Date of Birth: Nov 13, 1960 Referring Provider: Debbrah Alar, NP  Encounter Date: 04/28/2015      PT End of Session - 04/28/15 0805    Visit Number 9   Number of Visits 12   Date for PT Re-Evaluation 05/05/15   PT Start Time 0803   PT Stop Time 0851   PT Time Calculation (min) 48 min   Activity Tolerance Patient tolerated treatment well   Behavior During Therapy [facility] for tasks assessed/performed      Past Medical History  Diagnosis Date  . Arthritis   . History of chicken pox     Past Surgical History  Procedure Laterality Date  . Joint replacement      R hip  . Cholecystectomy  1991  . Ovarian cyst removal  1990  . Cesarean section  1995  . Cesarean section  1997  . Total hip arthroplasty  2011    There were no vitals filed for this visit.  Visit Diagnosis:  Neck pain of over 3 months duration  Midline thoracic back pain  Paresthesia of right arm      Subjective Assessment - 04/28/15 0806    Subjective Patient reports pain is now back to upper/mid thoracic on right, but remains low intensity.   Currently in Pain? Yes   Pain Score 1    Pain Location Thoracic   Pain Orientation Right;Upper            [facility] PT Assessment - 04/28/15 0803    AROM   AROM Assessment Site Cervical   Cervical Flexion 41   Cervical Extension 41   Cervical - Right Side Bend 18   Cervical - Left Side Bend 30   Cervical - Right Rotation 54   Cervical - Left Rotation 62          Today's Treatment  ROM check  TherEx UBE - lvl 3.0 fwd/back x3" each  Manual TPR to R levator at medial scapular border STM and manual stretching to B levator, upper/middle trap and rhomboids, R subscapularis Suboccipital release Grade 2-3  L 1st & 2nd posterior rib mobs in supine PROM Cervical Rotation and Side-bending with Contract/Relax at end range  TherEx BATCA Low Row 20# x10, 25# x10 Standing against 6" foam roll on wall:  B Shoulder Horizontal Abduction with blue TB 10x3"  B Alternating Shoulder Flexion + opposite Shoulder Extension diagonals blue TB 10x3"  B Shoulder ER with blue TB 20x3" TRX Low Row x10          PT Short Term Goals - 04/12/15 1808    PT SHORT TERM GOAL #1   Title Independent with inital HEP by 04/14/15   Status Achieved   PT SHORT TERM GOAL #2   Title Pt will demonstrate improved awareness of neutral cervical and shoulder posture by 04/14/15   Status Achieved           PT Long Term Goals - 04/28/15 0851    PT LONG TERM GOAL #1   Title Independent with latest HEP by 05/05/15   Status On-going   PT LONG TERM GOAL #2   Title Pt will routinely demonstrate neutral cervical and shoulder posture w/o cues by 05/05/15   Status Achieved   PT LONG TERM GOAL #3  Title Pt will demonstrate increased cervical ROM by at least 10 dg in all planes by 05/05/15   Status Partially Met  Met for bilateral rotation and L side-bending   PT LONG TERM GOAL #4   Title Pt will report ability to drive w/o limitation due to pain or restricted neck ROM by 05/05/15   Status Partially Met  Pt noting improved peripheral vision while driving due to improved neck ROM               Plan - 04/28/15 0902    Clinical Impression Statement Pt demonstrating more consistency with postural awareness with improved neutral neck and shoulder alignment. Cervical ROM improving but still with increased restriction R > L due to pain/tightness on left, with more ROM available with PROM than pt able to reproduce actively. Pain has remained at relatively low intensity for past few visits. Patient has 3 visits remaining in existing POC, therefore will plan to review HEP and assess for readiness to transition to home program vs need  for recert.   PT Next Visit Plan Cervical ROM/flexibilty, Postural training, Manual therapy, taping & Modalities PRN   Consulted and Agree with Plan of Care Patient        Problem List Patient Active Problem List   Diagnosis Date Noted  . Osteopenia 03/08/2015  . HTN (hypertension) 03/05/2015  . Neck pain 01/30/2015  . Preventative health care 01/30/2015  . Physical exam 12/18/2013  . Tobacco use disorder 07/17/2013  . Postmenopausal HRT (hormone replacement therapy) 07/17/2013  . Skin tag 04/24/2011  . Varicose veins 04/24/2011  . DEGENERATIVE JOINT DISEASE, RIGHT HIP 11/11/2008  . HTLVTYPE I CCE & UNS SITE 07/08/2007    Percival Spanish, PT, MPT 04/28/2015, 9:14 AM  [facility] [address]  [place], [place], [address] Phone: [phone]   Fax:  [phone]  Name: GALIA RAHM MRN: [id number] Date of Birth: Dec 27, 1960

## 2015-05-03 ENCOUNTER — Ambulatory Visit: Payer: BLUE CROSS/BLUE SHIELD | Admitting: Physical Therapy

## 2015-05-03 DIAGNOSIS — M546 Pain in thoracic spine: Secondary | ICD-10-CM

## 2015-05-03 DIAGNOSIS — G8929 Other chronic pain: Principal | ICD-10-CM

## 2015-05-03 DIAGNOSIS — R202 Paresthesia of skin: Secondary | ICD-10-CM

## 2015-05-03 DIAGNOSIS — M542 Cervicalgia: Secondary | ICD-10-CM | POA: Diagnosis not present

## 2015-05-03 NOTE — Therapy (Signed)
[facility] [place] [facility] [address]  [place], [place], [address] Phone: [phone]   Fax:  [phone]  Physical Therapy Treatment  Patient Details  Name: Lauren Walters MRN: [id number] Date of Birth: Jul 23, 1960 Referring Provider: Debbrah Alar, NP  Encounter Date: 05/03/2015      PT End of Session - 05/03/15 1714    Visit Number 10   Number of Visits 12   Date for PT Re-Evaluation 05/05/15   PT Start Time 9892   PT Stop Time 1801   PT Time Calculation (min) 56 min   Activity Tolerance Patient tolerated treatment well   Behavior During Therapy [facility] for tasks assessed/performed      Past Medical History  Diagnosis Date  . Arthritis   . History of chicken pox     Past Surgical History  Procedure Laterality Date  . Joint replacement      R hip  . Cholecystectomy  1991  . Ovarian cyst removal  1990  . Cesarean section  1995  . Cesarean section  1997  . Total hip arthroplasty  2011    There were no vitals filed for this visit.  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 Pain still 5/10 this morning, but better this afternoon only after taking Naproxen earlier today.   Currently in Pain? Yes   Pain Score 3    Pain Location Thoracic   Pain Orientation Lower   Pain Descriptors / Indicators Aching            [facility] PT Assessment - 05/03/15 1705    Observation/Other Assessments   Focus on Therapeutic Outcomes (FOTO)  Neck - 65% (35% limitation)         Today's Treatment  TherEx UBE - lvl 3.0 fwd/back x3"  each  Manual STM, MFR  and manual stretching to B lower trap and lower thoracic paraspinals Grade 2-3 T8-T12 A/P mobs in prone  Kinesiotaping - 3 strips   2 strips (30%) along paraspinals  from mid lumbar to mid thoracic spine   1 strip (50-70%) horizontally across T10 level   Modalities Estim to lower thoracic paraspinals - IFC 80-150Hz, 40% scan, intensity to pt tolerance x10' Moist heat pack to lower thoracic spine x10'         PT Short Term Goals - 04/12/15 1808    PT SHORT TERM GOAL #1   Title Independent with inital HEP by 04/14/15   Status Achieved   PT SHORT TERM GOAL #2   Title Pt will demonstrate improved awareness of neutral cervical and shoulder posture by 04/14/15   Status Achieved           PT Long Term Goals - 04/28/15 0851    PT LONG TERM GOAL #1   Title Independent with latest HEP by 05/05/15   Status On-going   PT LONG TERM GOAL #2   Title Pt will routinely demonstrate neutral cervical and shoulder posture w/o cues by 05/05/15   Status Achieved   PT LONG TERM GOAL #3  Title Pt will demonstrate increased cervical ROM by at least 10 dg in all planes by 05/05/15   Status Partially Met  Met for bilateral rotation and L side-bending   PT LONG TERM GOAL #4   Title Pt will report ability to drive w/o limitation due to pain or restricted neck ROM by 05/05/15   Status Partially Met  Pt noting improved peripheral vision while driving due to improved neck ROM               Plan - 05/03/15 1757    Clinical Impression Statement Pt denies cervical or upper thoracic pain today but reporting new, more intense pain in mid lower thoracic spine since Sunday 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 Assessment revealed restriction at T10 with propensity for R rotation of vertebrae with thoracic extension. Vertebral mobility improved with mobs and MFR with pt noting improvement in pain but continued increased tension noted in lower thoracic paraspinals, therefore  kinesiotape applied followed by IFC with moist heat to promote muscle relaxation. Will assess reponse at next visit and if lower thoracic pain improved and cervical/upper thoracic pain remains painfree will proceed with HEP review and preparation to transition to home program.    PT Next Visit Plan HEP review for Cervical ROM/flexibilty and Postural training in prep for probable D/C within next 1-2 visits, Manual therapy, taping & Modalities PRN   Consulted and Agree with Plan of Care Patient        Problem List Patient Active Problem List   Diagnosis Date Noted  . Osteopenia 03/08/2015  . HTN (hypertension) 03/05/2015  . Neck pain 01/30/2015  . Preventative health care 01/30/2015  . Physical exam 12/18/2013  . Tobacco use disorder 07/17/2013  . Postmenopausal HRT (hormone replacement therapy) 07/17/2013  . Skin tag 04/24/2011  . Varicose veins 04/24/2011  . DEGENERATIVE JOINT DISEASE, RIGHT HIP 11/11/2008  . HTLVTYPE I CCE & UNS SITE 07/08/2007    JoAnne M Kreis, PT, MPT 05/03/2015, 6:16 PM  [facility] [place] [address]  [address] [place], [place], [address] Phone: [phone]   Fax:  [phone]  Name: Lauren Walters MRN: [id number] Date of Birth: 03/09/1960    

## 2015-05-06 ENCOUNTER — Ambulatory Visit: Payer: BLUE CROSS/BLUE SHIELD | Admitting: Physical Therapy

## 2015-05-06 DIAGNOSIS — G8929 Other chronic pain: Principal | ICD-10-CM

## 2015-05-06 DIAGNOSIS — M542 Cervicalgia: Secondary | ICD-10-CM | POA: Diagnosis not present

## 2015-05-06 DIAGNOSIS — M546 Pain in thoracic spine: Secondary | ICD-10-CM

## 2015-05-06 NOTE — Therapy (Signed)
[facility] [place] [address]  [place], [place], [address] Phone: [phone]   Fax:  [phone]  Physical Therapy Treatment  Patient Details  Name: Lauren Walters MRN: [id number] Date of Birth: 1960-07-23 Referring Provider: Debbrah Alar, NP  Encounter Date: 05/06/2015      PT End of Session - 05/06/15 0804    Visit Number 11   Number of Visits 12   Date for PT Re-Evaluation 05/05/15   PT Start Time 0758   PT Stop Time 0848   PT Time Calculation (min) 50 min   Activity Tolerance Patient tolerated treatment well   Behavior During Therapy [facility] for tasks assessed/performed      Past Medical History  Diagnosis Date  . Arthritis   . History of chicken pox     Past Surgical History  Procedure Laterality Date  . Joint replacement      R hip  . Cholecystectomy  1991  . Ovarian cyst removal  1990  . Cesarean section  1995  . Cesarean section  1997  . Total hip arthroplasty  2011    There were no vitals filed for this visit.  Visit Diagnosis:  Neck pain of over 3 months duration  Midline thoracic back pain      Subjective Assessment - 05/06/15 0801    Subjective Pt reports lower thoracic pain now better but pain has returned to upper thoracic area between shoulder blades.   Currently in Pain? Yes   Pain Score 3    Pain Location Thoracic   Pain Orientation Upper          Today's Treatment  TherEx UBE - lvl 3.0 fwd/back x3' each  Manual STM, MFRand manual stretching to B upper/middle/lower traps, levator, scalenes, rhomboids and thoracic paraspinals Grade 2-3 T2-T6 & T9-T12 A/P mobs in prone PROM Cervical Rotation and Side-bending with Contract/Relax at end range  Kinesiotaping - 4 strips  2 strips (30%) along paraspinals from mid lumbar to upper thoracic spine  2 strips (50-70%) horizontally across T5 & T11 levels   TherEx Seated stretches:   Rhomboid 2x30"   B upper trap  x30" each   Ant/mid & mid/post scalenes x30" each Supine over foam roller horizontally across thoracic spine rolling vertically along thoracic paraspinals - uncomfortable holding neck unsupported therefore transitioned to standing against wall Hooklying on 6" foam roll:  Chest/pec stretch x2'   B Shoulder Horizontal Abduction with blue TB 15x3"  B Alternating Shoulder Flexion + opposite Shoulder Extension diagonals blue TB 15x3"  B Shoulder ER with blue TB 15x3"          PT Education - 05/06/15 0901    Education provided Yes   Education Details Review of upper trap, levator & rhomboid stretches + addition of scalene stretches   Person(s) Educated Patient   Methods Explanation;Demonstration;Handout   Comprehension Verbalized understanding;Returned demonstration          PT Short Term Goals - 04/12/15 1808    PT SHORT TERM GOAL #1   Title Independent with inital HEP by 04/14/15   Status Achieved   PT SHORT TERM GOAL #2   Title Pt will demonstrate improved awareness of neutral cervical and shoulder posture by 04/14/15   Status Achieved           PT Long Term Goals - 04/28/15 0851    PT LONG TERM GOAL #1   Title Independent with latest HEP by 05/05/15   Status  On-going   PT LONG TERM GOAL #2   Title Pt will routinely demonstrate neutral cervical and shoulder posture w/o cues by 05/05/15   Status Achieved   PT LONG TERM GOAL #3   Title Pt will demonstrate increased cervical ROM by at least 10 dg in all planes by 05/05/15   Status Partially Met  Met for bilateral rotation and L side-bending   PT LONG TERM GOAL #4   Title Pt will report ability to drive w/o limitation due to pain or restricted neck ROM by 05/05/15   Status Partially Met  Pt noting improved peripheral vision while driving due to improved neck ROM               Plan - 05/06/15 0902    Clinical Impression Statement Pt continues to experience fluctuating pain between upper and lower thoracic spine with  increased restriction noted at T4-5 level today. Given increased need for focus on thoracic spine pain and restrictions, limited time spent on cervical ROM restrictions over past few visits. Will reassess pain, ROM and goal status at next visit, but anticipate may need to extend POC for a few additional visits.   PT Next Visit Plan Assess pain, ROM and goal status to determine readiness for D/C vs need to extend POC (recert) - if ready for D/C, review final HEP; Manual therapy, taping & Modalities PRN   Consulted and Agree with Plan of Care Patient        Problem List Patient Active Problem List   Diagnosis Date Noted  . Osteopenia 03/08/2015  . HTN (hypertension) 03/05/2015  . Neck pain 01/30/2015  . Preventative health care 01/30/2015  . Physical exam 12/18/2013  . Tobacco use disorder 07/17/2013  . Postmenopausal HRT (hormone replacement therapy) 07/17/2013  . Skin tag 04/24/2011  . Varicose veins 04/24/2011  . DEGENERATIVE JOINT DISEASE, RIGHT HIP 11/11/2008  . HTLVTYPE I CCE & UNS SITE 07/08/2007    Percival Spanish, PT, MPT 05/06/2015, 9:27 AM  [facility] [address]  [place], [place], [address] Phone: [phone]   Fax:  [phone]  Name: Lauren Walters MRN: [id number] Date of Birth: 02-24-1961

## 2015-05-10 ENCOUNTER — Ambulatory Visit: Payer: BLUE CROSS/BLUE SHIELD | Admitting: Physical Therapy

## 2015-05-10 DIAGNOSIS — G8929 Other chronic pain: Principal | ICD-10-CM

## 2015-05-10 DIAGNOSIS — M546 Pain in thoracic spine: Secondary | ICD-10-CM

## 2015-05-10 DIAGNOSIS — R202 Paresthesia of skin: Secondary | ICD-10-CM

## 2015-05-10 DIAGNOSIS — M542 Cervicalgia: Secondary | ICD-10-CM

## 2015-05-10 NOTE — Therapy (Addendum)
[facility] [place] [address]  [place], [place], [address] Phone: [phone]   Fax:  [phone]  Physical Therapy Treatment  Patient Details  Name: Lauren Walters MRN: [id number] Date of Birth: 1960-04-17 Referring Provider: Debbrah Alar, NP  Encounter Date: 05/10/2015      PT End of Session - 05/10/15 1801    Visit Number 12   Number of Visits 12   PT Start Time 1950   PT Stop Time 1756   PT Time Calculation (min) 47 min   Activity Tolerance Patient tolerated treatment well   Behavior During Therapy [facility] for tasks assessed/performed      Past Medical History  Diagnosis Date  . Arthritis   . History of chicken pox     Past Surgical History  Procedure Laterality Date  . Joint replacement      R hip  . Cholecystectomy  1991  . Ovarian cyst removal  1990  . Cesarean section  1995  . Cesarean section  1997  . Total hip arthroplasty  2011    There were no vitals filed for this visit.  Visit Diagnosis:  Neck pain of over 3 months duration  Midline thoracic back pain  Paresthesia of right arm      Subjective Assessment - 05/10/15 1714    Subjective Pt reports almost no pain at this point. Pt reports improving ability to move neck with improved peripheral vision when driving.   Currently in Pain? Yes   Pain Score 1    Pain Location Thoracic   Pain Orientation Upper            [facility] PT Assessment - 05/10/15 1709    Assessment   Medical Diagnosis Neck pain   Onset Date/Surgical Date --  exacerbation progressing over past 6 months   Hand Dominance Right   Prior Therapy none   ROM / Strength   AROM / PROM / Strength AROM   AROM   AROM Assessment Site Cervical   Cervical Flexion 42   Cervical Extension 44   Cervical - Right Side Bend 22   Cervical - Left Side Bend 28   Cervical - Right Rotation 65   Cervical - Left Rotation 68         Today's Treatment  TherEx UBE - lvl  3.0 fwd/back x3' each Seated stretches:  Rhomboid 2x30"  B upper trap x30" each  Ant/mid & mid/post scalenes x30" each Hooklying on 6" foam roll:  Chest/pec stretch x2'  B Shoulder Horizontal Abduction with blue TB 15x3"  B Alternating Shoulder Flexion + opposite Shoulder Extension diagonals blue TB 15x3"  B Shoulder ER with blue TB 15x3" Standing "W" Rows with blue TB 10x3"          PT Education - 05/10/15 1819    Education provided Yes   Education Details Final HEP with instructions for progression at home   Person(s) Educated Patient   Methods Explanation;Demonstration   Comprehension Verbalized understanding;Returned demonstration          PT Short Term Goals - 04/12/15 1808    PT SHORT TERM GOAL #1   Title Independent with inital HEP by 04/14/15   Status Achieved   PT SHORT TERM GOAL #2   Title Pt will demonstrate improved awareness of neutral cervical and shoulder posture by 04/14/15   Status Achieved           PT Long Term Goals - 05/10/15 1727  PT LONG TERM GOAL #1   Title Independent with latest HEP by 05/05/15   Status Achieved   PT LONG TERM GOAL #2   Title Pt will routinely demonstrate neutral cervical and shoulder posture w/o cues by 05/05/15   Status Achieved   PT LONG TERM GOAL #3   Title Pt will demonstrate increased cervical ROM by at least 10 dg in all planes by 05/05/15   Status Partially Met  ROM improved in all planes but goal only met for rotation and L sidebending   PT LONG TERM GOAL #4   Title Pt will report ability to drive w/o limitation due to pain or restricted neck ROM by 05/05/15   Status Achieved               Plan - 05/10/15 1802    Clinical Impression Statement Pt has demonstrated good progress with PT with improvements noted in neutral spine posture and cervical ROM in all planes, with pt reporting improved functional movement in neck during normal daily tasks such as driving. Pt reports pain now down to "barely  1/10". Pt is pleased with progress and feels ready to transition to HEP therefore reviewed and updated HEP to reflect most relevant HEP exercises for patient to continue with on her own. Will place pt on hold for PT x 30 days in the event that pain increases or issues arise with HEP. If no need to return within 30 days, will proceed with discharge from PT for this episode.   PT Next Visit Plan 30 day hold; recert if needs to return, otherwise will proceed with discharge after 30 days   Consulted and Agree with Plan of Care Patient        Problem List Patient Active Problem List   Diagnosis Date Noted  . Osteopenia 03/08/2015  . HTN (hypertension) 03/05/2015  . Neck pain 01/30/2015  . Preventative health care 01/30/2015  . Physical exam 12/18/2013  . Tobacco use disorder 07/17/2013  . Postmenopausal HRT (hormone replacement therapy) 07/17/2013  . Skin tag 04/24/2011  . Varicose veins 04/24/2011  . DEGENERATIVE JOINT DISEASE, RIGHT HIP 11/11/2008  . HTLVTYPE I CCE & UNS SITE 07/08/2007    Percival Spanish, PT, MPT 05/10/2015, 6:24 PM  [facility] [address]  [place], [place], [address] Phone: [phone]   Fax:  [phone]  Name: Lauren Walters MRN: [id number] Date of Birth: Jan 12, 1961    PHYSICAL THERAPY DISCHARGE SUMMARY  Visits from Start of Care: 12  Current functional level related to goals / functional outcomes:   As of last PT visit, pt had demonstrated good progress with PT with improvements noted in neutral spine posture and cervical ROM in all planes, with pt reporting improved functional movement in neck during normal daily tasks such as driving. Pt reported pain was down to "barely 1/10". Pt was pleased with progress and felt ready to transition to HEP therefore reviewed and updated HEP to reflect most relevant HEP exercises for patient to continue with on her own. Pt was placed on hold for PT x 30  days in the event that pain increases or issues arise with HEP. Pt has not needed to return in > 30 days, therefore will proceed with discharge from PT for this episode.   Remaining deficits:  Mild limitations in cervical ROM   Education / Equipment:  HEP  Plan: Patient agrees to discharge.  Patient goals were partially met. Patient  is being discharged due to being pleased with the current functional level.  ?????       Percival Spanish, PT, MPT 06/28/2015, 9:19 AM  [facility] [address]  [place], [place], [address] Phone: [phone]   Fax:  [phone]

## 2015-05-13 ENCOUNTER — Ambulatory Visit: Payer: BLUE CROSS/BLUE SHIELD | Admitting: Physical Therapy

## 2015-05-15 ENCOUNTER — Other Ambulatory Visit: Payer: Self-pay | Admitting: Family

## 2015-05-15 ENCOUNTER — Encounter: Payer: Self-pay | Admitting: Family

## 2015-05-16 MED ORDER — VARENICLINE TARTRATE 1 MG PO TABS: 1.0000 mg | ORAL_TABLET | Freq: Two times a day (BID) | ORAL | Status: DC

## 2015-05-17 ENCOUNTER — Ambulatory Visit: Payer: BLUE CROSS/BLUE SHIELD | Admitting: Physical Therapy

## 2015-05-17 NOTE — Telephone Encounter (Signed)
Med denied, filled yesterday by PCP.

## 2015-05-20 ENCOUNTER — Ambulatory Visit: Payer: BLUE CROSS/BLUE SHIELD | Admitting: Physical Therapy

## 2015-06-03 ENCOUNTER — Ambulatory Visit: Payer: BLUE CROSS/BLUE SHIELD | Admitting: Family

## 2015-06-17 ENCOUNTER — Ambulatory Visit (INDEPENDENT_AMBULATORY_CARE_PROVIDER_SITE_OTHER): Payer: BLUE CROSS/BLUE SHIELD | Admitting: Family

## 2015-06-17 ENCOUNTER — Encounter: Payer: Self-pay | Admitting: Family

## 2015-06-17 VITALS — BP 138/90 | HR 71 | Temp 98.3°F | Resp 16 | Ht 70.0 in | Wt 179.0 lb

## 2015-06-17 DIAGNOSIS — I10 Essential (primary) hypertension: Secondary | ICD-10-CM | POA: Diagnosis not present

## 2015-06-17 DIAGNOSIS — R002 Palpitations: Secondary | ICD-10-CM | POA: Diagnosis not present

## 2015-06-17 DIAGNOSIS — F17200 Nicotine dependence, unspecified, uncomplicated: Secondary | ICD-10-CM | POA: Diagnosis not present

## 2015-06-17 MED ORDER — METOPROLOL SUCCINATE ER 25 MG PO TB24: 25.0000 mg | ORAL_TABLET | Freq: Every day | ORAL | Status: DC

## 2015-06-17 NOTE — Assessment & Plan Note (Signed)
Pt quit smoking 1 month ago. Continue chantix.  Congratulated patient on quitting smoking.

## 2015-06-17 NOTE — Progress Notes (Signed)
Subjective:    Patient ID: Lauren Walters, female    DOB: 1960-11-26, 55 y.o.   MRN: [id number]  HPI  Lauren Walters is a 55 yr old female who presents today for follow up.  1) HTN- Not currently on medication BP Readings from Last 3 Encounters:  06/17/15 138/90  03/04/15 144/85  01/28/15 154/77   2) Palpitations- notes occasional sensation of "heart racing" x 1 week.  Occurs off/on throughout the day.    3) Tobacco abuse- pt stopped smoking 1 month ago.   Review of Systems See HPI  Past Medical History  Diagnosis Date  . Arthritis   . History of chicken pox      Social History   Social History  . Marital Status: Married    Spouse Name: N/A  . Number of Children: N/A  . Years of Education: N/A   Occupational History  . Not on file.   Social History Main Topics  . Smoking status: Former Smoker    Quit date: 05/17/2015  . Smokeless tobacco: Not on file  . Alcohol Use: 8.4 oz/week    14 Glasses of wine per week  . Drug Use: No  . Sexual Activity: Not on file   Other Topics Concern  . Not on file   Social History Narrative   23- daughter   1997-son   1998- daughter      Lauren Walters at home   Married   Biking/tennis/swimming, reading   Lauren Walters (neurosurgery)        Past Surgical History  Procedure Laterality Date  . Joint replacement      R hip  . Cholecystectomy  1991  . Ovarian cyst removal  1990  . Cesarean section  1995  . Cesarean section  1997  . Total hip arthroplasty  2011    Family History  Problem Relation Age of Onset  . Alcohol abuse Father     deceased  . Cancer Father     prostate, basal cell   . Arthritis Father   . Heart disease Father   . Arthritis Paternal Grandfather   . Heart disease Paternal Grandfather   . Heart disease Maternal Grandfather     No Known Allergies  Current Outpatient Prescriptions on File Prior to Visit  Medication Sig Dispense Refill  . Calcium Carbonate-Vitamin D (CALTRATE 600+D)  600-400 MG-UNIT tablet Take 1 tablet by mouth 2 (two) times daily.    Marland Kitchen estradiol (ESTRING) 2 MG vaginal ring PLACE 2 MG VAGINALLY EVERY 3 (THREE) MONTHS. FOLLOW PACKAGE DIRECTIONS 1 each 3  . meloxicam (MOBIC) 7.5 MG tablet TAKE 1 TABLET BY MOUTH DAILY 30 tablet 0  . varenicline (CHANTIX CONTINUING MONTH PAK) 1 MG tablet Take 1 tablet (1 mg total) by mouth 2 (two) times daily. 60 tablet 1   No current facility-administered medications on file prior to visit.    BP 138/90 mmHg  Pulse 71  Temp(Src) 98.3 F (36.8 C) (Oral)  Resp 16  Ht 5\' 10"  (1.778 m)  Wt 179 lb (81.194 kg)  BMI 25.68 kg/m2  SpO2 100%       Objective:   Physical Exam  Constitutional: She is oriented to person, place, and time. She appears well-developed and well-nourished.  Cardiovascular: Normal rate, regular rhythm and normal heart sounds.   No murmur heard. Pulmonary/Chest: Effort normal and breath sounds normal. No respiratory distress. She has no wheezes.  Musculoskeletal: She exhibits no edema.  Neurological: She is alert and oriented  to person, place, and time.  Psychiatric: She has a normal mood and affect. Her behavior is normal. Judgment and thought content normal.          Assessment & Plan:  Palpitations:  EKG tracing is personally reviewed.  EKG notes NSR.  No acute changes.  Will obtain bmet, cbc, TSH to further evaluate.

## 2015-06-17 NOTE — Patient Instructions (Addendum)
Complete lab work prior to leaving.  Begin Metoprolol once daily. Follow up in 1 month, call sooner if palpitations worsen or do not improve.  Good job quitting smoking!

## 2015-06-17 NOTE — Assessment & Plan Note (Addendum)
Manual repeat BP 150/75.  Will initiate Metoprolol once daily. This should help with BP control as well as palpitations. Discussed exercise, low sodium diet, modest weight loss.

## 2015-06-28 ENCOUNTER — Encounter: Payer: Self-pay | Admitting: Family

## 2015-06-28 NOTE — Telephone Encounter (Signed)
Spoke to patient. She reports + chest pain currently. Chest pain is substernal. She is at work at [employer] pt to go to ER for further evaluation. She verbalizes understanding.

## 2015-06-29 ENCOUNTER — Encounter: Payer: Self-pay | Admitting: Family

## 2015-07-15 ENCOUNTER — Encounter: Payer: Self-pay | Admitting: Family

## 2015-07-15 ENCOUNTER — Ambulatory Visit (INDEPENDENT_AMBULATORY_CARE_PROVIDER_SITE_OTHER): Payer: BLUE CROSS/BLUE SHIELD | Admitting: Family

## 2015-07-15 VITALS — BP 118/78 | HR 61 | Temp 97.8°F | Resp 18 | Ht 70.0 in | Wt 179.2 lb

## 2015-07-15 DIAGNOSIS — Z720 Tobacco use: Secondary | ICD-10-CM | POA: Diagnosis not present

## 2015-07-15 DIAGNOSIS — I10 Essential (primary) hypertension: Secondary | ICD-10-CM | POA: Diagnosis not present

## 2015-07-15 DIAGNOSIS — R0789 Other chest pain: Secondary | ICD-10-CM

## 2015-07-15 LAB — BASIC METABOLIC PANEL
BUN: 10 mg/dL (ref 6–23)
CALCIUM: 9.9 mg/dL (ref 8.4–10.5)
CHLORIDE: 100 meq/L (ref 96–112)
CO2: 31 meq/L (ref 19–32)
Creatinine, Ser: 0.71 mg/dL (ref 0.40–1.20)
GFR: 90.22 mL/min (ref >60.00)
Glucose, Bld: 93 mg/dL (ref 70–99)
POTASSIUM: 3.5 meq/L (ref 3.5–5.1)
SODIUM: 140 meq/L (ref 135–145)

## 2015-07-15 MED ORDER — HYDROCHLOROTHIAZIDE 25 MG PO TABS: 25.0000 mg | ORAL_TABLET | Freq: Every day | ORAL | Status: DC

## 2015-07-15 NOTE — Progress Notes (Signed)
Pre visit review using our clinic review tool, if applicable. No additional management support is needed unless otherwise documented below in the visit note. 

## 2015-07-15 NOTE — Patient Instructions (Signed)
Please complete lab work prior to leaving.   

## 2015-07-15 NOTE — Addendum Note (Signed)
Addended by: Kelle Darting A on: 07/15/2015 09:00 AM   Modules accepted: Orders, Medications

## 2015-07-15 NOTE — Progress Notes (Signed)
Subjective:    Patient ID: Lauren Walters, female    DOB: Nov 15, 1960, 55 y.o.   MRN: [id number]  HPI   [name] is a 55 yr old female who presents today for follow up.  Since last visit she has been evaluated at [facility] for chest pain on 5/2.  ED record is reviewed in care everywhere.  Troponin was negative.  Toprol was d/c'd by ER and pt was placed on HCTZ.  She has also decreased her chantix to 1/2 tab and notes resolution of chest pain. Feeling better.  Still not smoking.  She is on her last month of chantix.     Review of Systems See HPI  Past Medical History  Diagnosis Date  . Arthritis   . History of chicken pox      Social History   Social History  . Marital Status: Married    Spouse Name: N/A  . Number of Children: N/A  . Years of Education: N/A   Occupational History  . Not on file.   Social History Main Topics  . Smoking status: Former Smoker    Quit date: 05/17/2015  . Smokeless tobacco: Not on file  . Alcohol Use: 8.4 oz/week    14 Glasses of wine per week  . Drug Use: No  . Sexual Activity: Not on file   Other Topics Concern  . 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 Joint replacement      R hip  . Cholecystectomy  1991  . Ovarian cyst removal  1990  . Cesarean section  1995  . Cesarean section  1997  . Total hip arthroplasty  2011    Family History  Problem Relation Age of Onset  . Alcohol abuse Father     deceased  . Cancer Father     prostate, basal cell   . Arthritis Father   . Heart disease Father   . Arthritis Paternal Grandfather   . Heart disease Paternal Grandfather   . Heart disease Maternal Grandfather     Allergies  Allergen Reactions  . Metoprolol Succinate Er Other (See Comments)    Chest pain,  increased palpitations    Current Outpatient Prescriptions on File Prior to Visit  Medication Sig Dispense Refill  . Calcium Carbonate-Vitamin D (CALTRATE 600+D) 600-400 MG-UNIT tablet Take 1 tablet by mouth 2 (two) times daily.    Marland Kitchen estradiol (ESTRING) 2 MG vaginal ring PLACE 2 MG VAGINALLY EVERY 3 (THREE) MONTHS. FOLLOW PACKAGE DIRECTIONS 1 each 3  . meloxicam (MOBIC) 7.5 MG tablet TAKE 1 TABLET BY MOUTH DAILY 30 tablet 0  . varenicline (CHANTIX CONTINUING MONTH PAK) 1 MG tablet Take 1 tablet (1 mg total) by mouth 2 (two) times daily. (Patient taking differently: Take 0.5 mg by mouth 2 (two) times daily. ) 60 tablet 1   No current facility-administered medications on file prior to visit.    BP 118/78 mmHg  Pulse 61  Temp(Src) 97.8 F (36.6 C) (Oral)  Resp 18  Ht 5\' 10"  (1.778 m)  Wt 179 lb 3.2 oz (81.285 kg)  BMI 25.71 kg/m2  SpO2 98%       Objective:   Physical Exam  Constitutional: She appears  well-developed and well-nourished.  Cardiovascular: Normal rate, regular rhythm and normal heart sounds.   No murmur heard. Pulmonary/Chest: Effort normal and breath sounds normal. No respiratory distress. She has no wheezes.  Psychiatric: She has a normal mood and affect. Her behavior is normal. Judgment and thought content normal.          Assessment & Plan:  Atypical Chest Pain-  Resolved.    HTN- BP stable on hctz. Obtain follow up bmet.  Tobacco abuse- commended patient on quitting smoking. Complete final month of chantix.

## 2015-08-12 ENCOUNTER — Encounter: Payer: Self-pay | Admitting: Family

## 2015-08-12 NOTE — Telephone Encounter (Signed)
As far as well know medicare is the only ins that pays for it

## 2015-09-13 ENCOUNTER — Telehealth: Payer: Self-pay | Admitting: *Deleted

## 2015-09-13 MED ORDER — HYDROCHLOROTHIAZIDE 25 MG PO TABS: 25.0000 mg | ORAL_TABLET | Freq: Every day | ORAL | Status: DC

## 2015-09-13 NOTE — Telephone Encounter (Signed)
Received fax from [facility] requesting Rx for 90 day supply of HCTZ. Rx sent.

## 2015-10-28 ENCOUNTER — Ambulatory Visit: Payer: BLUE CROSS/BLUE SHIELD | Admitting: Family

## 2015-11-11 ENCOUNTER — Ambulatory Visit (INDEPENDENT_AMBULATORY_CARE_PROVIDER_SITE_OTHER): Payer: BLUE CROSS/BLUE SHIELD | Admitting: Family

## 2015-11-11 ENCOUNTER — Encounter: Payer: Self-pay | Admitting: Family

## 2015-11-11 VITALS — BP 138/80 | HR 72 | Temp 98.6°F | Resp 16 | Ht 70.0 in | Wt 179.2 lb

## 2015-11-11 DIAGNOSIS — F17200 Nicotine dependence, unspecified, uncomplicated: Secondary | ICD-10-CM | POA: Diagnosis not present

## 2015-11-11 DIAGNOSIS — R0789 Other chest pain: Secondary | ICD-10-CM | POA: Diagnosis not present

## 2015-11-11 DIAGNOSIS — Z23 Encounter for immunization: Secondary | ICD-10-CM | POA: Diagnosis not present

## 2015-11-11 DIAGNOSIS — I10 Essential (primary) hypertension: Secondary | ICD-10-CM | POA: Diagnosis not present

## 2015-11-11 HISTORY — DX: Other chest pain: R07.89

## 2015-11-11 LAB — BASIC METABOLIC PANEL
BUN: 15 mg/dL (ref 6–23)
CALCIUM: 9.9 mg/dL (ref 8.4–10.5)
CO2: 34 mEq/L — ABNORMAL HIGH (ref 19–32)
Chloride: 100 mEq/L (ref 96–112)
Creatinine, Ser: 0.7 mg/dL (ref 0.40–1.20)
GFR: 91.6 mL/min (ref >60.00)
Glucose, Bld: 109 mg/dL — ABNORMAL HIGH (ref 70–99)
POTASSIUM: 3.8 meq/L (ref 3.5–5.1)
SODIUM: 141 meq/L (ref 135–145)

## 2015-11-11 MED ORDER — OMEPRAZOLE 40 MG PO CPDR: 40.0000 mg | DELAYED_RELEASE_CAPSULE | Freq: Every day | ORAL | 3 refills | Status: DC

## 2015-11-11 NOTE — Assessment & Plan Note (Addendum)
Add trial of PPI. I suspect that this is GERD related.  EKG is personally reviewed notes NSR without acute changes.  She has had a lot of stress recently, worrying about her elderly mother and just dropped her youngest child at college.

## 2015-11-11 NOTE — Assessment & Plan Note (Signed)
Pt has successfully quit smoking. Commended pt for this.

## 2015-11-11 NOTE — Assessment & Plan Note (Addendum)
BP is stable on current medications. Continue same, obtain follow up bmet.

## 2015-11-11 NOTE — Patient Instructions (Signed)
Please complete lab work prior to leaving. You will be contacted about your referral for a stress test. Try holding your multivitamin to see if it improves the metalic taste in your mouth. Begin omeprazole once daily for possible reflux. If you have severe chest pain, please go to the Emergency Room.

## 2015-11-11 NOTE — Progress Notes (Signed)
Pre visit review using our clinic review tool, if applicable. No additional management support is needed unless otherwise documented below in the visit note. 

## 2015-11-11 NOTE — Progress Notes (Signed)
Subjective:    Patient ID: Lauren Walters, female    DOB: 02/08/1961, 55 y.o.   MRN: [id number]  HPI  Lauren Walters is a 55 yr old female who presents today for follow up of her hypertension.   HTN- continues hctz.  BP Readings from Last 3 Encounters:  11/11/15 138/80  07/15/15 118/78  06/17/15 138/90   Metal taste in her mouth- has been present for some time.   Substernal chest pain "like a tightness."  Non-radiating. Pain feels better with exercise.   She is still smoke free.    Review of Systems    see HPI  Past Medical History:  Diagnosis Date  . Arthritis   . History of chicken pox      Social History   Social History  . Marital status: Married    Spouse name: N/A  . Number of children: N/A  . Years of education: N/A   Occupational History  . Not on file.   Social History Main Topics  . Smoking status: Former Smoker    Quit date: 05/17/2015  . Smokeless tobacco: Not on file  . Alcohol use 8.4 oz/week    14 Glasses of wine per week  . Drug use: No  . Sexual activity: Not on file   Other Topics Concern  . 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 CESAREAN SECTION  1995  . CESAREAN SECTION  1997  . CHOLECYSTECTOMY  1991  . JOINT REPLACEMENT     R hip  . OVARIAN CYST REMOVAL  1990  . TOTAL HIP ARTHROPLASTY  2011    Family History  Problem Relation Age of Onset  . Alcohol abuse Father     deceased  . Cancer Father     prostate, basal cell   . Arthritis Father   . Heart disease Father   . Arthritis Paternal Grandfather   . Heart disease Paternal Grandfather   . Heart disease Maternal Grandfather     Allergies  Allergen Reactions  . Metoprolol Succinate Er Other (See Comments)    Chest pain, increased palpitations     Current Outpatient Prescriptions on File Prior to Visit  Medication Sig Dispense Refill  . Calcium Carbonate-Vitamin D (CALTRATE 600+D) 600-400 MG-UNIT tablet Take 1 tablet by mouth 2 (two) times daily.    Marland Kitchen estradiol (ESTRING) 2 MG vaginal ring PLACE 2 MG VAGINALLY EVERY 3 (THREE) MONTHS. FOLLOW PACKAGE DIRECTIONS 1 each 3  . hydrochlorothiazide (HYDRODIURIL) 25 MG tablet Take 1 tablet (25 mg total) by mouth daily. 90 tablet 1  . varenicline (CHANTIX CONTINUING MONTH PAK) 1 MG tablet Take 1 tablet (1 mg total) by mouth 2 (two) times daily. (Patient taking differently: Take 0.5 mg by mouth 2 (two) times daily. ) 60 tablet 1   No current facility-administered medications on file prior to visit.     BP 138/80 (BP Location: Right Arm, Cuff Size: Normal)   Pulse 72   Temp 98.6 F (37 C) (Oral)   Resp 16   Ht 5\' 10"  (1.778 m)   Wt 179 lb 3.2 oz (81.3 kg)   SpO2 100% Comment: room air  BMI 25.71 kg/m    Objective:   Physical  Exam  Constitutional: She is oriented to person, place, and time. She appears well-developed and well-nourished.  Cardiovascular: Normal rate, regular rhythm and normal heart sounds.   No murmur heard. Pulmonary/Chest: Effort normal and breath sounds normal. No respiratory distress. She has no wheezes.  Musculoskeletal: She exhibits no edema.  Neurological: She is alert and oriented to person, place, and time.  Psychiatric: She has a normal mood and affect. Her behavior is normal. Judgment and thought content normal.          Assessment & Plan:

## 2015-11-24 ENCOUNTER — Encounter: Payer: Self-pay | Admitting: Family

## 2015-11-25 NOTE — Telephone Encounter (Signed)
Could you please initiate cologuard for her? See [employer].

## 2015-12-01 ENCOUNTER — Ambulatory Visit (INDEPENDENT_AMBULATORY_CARE_PROVIDER_SITE_OTHER): Payer: BLUE CROSS/BLUE SHIELD

## 2015-12-01 DIAGNOSIS — R0789 Other chest pain: Secondary | ICD-10-CM

## 2015-12-01 DIAGNOSIS — Z23 Encounter for immunization: Secondary | ICD-10-CM | POA: Diagnosis not present

## 2015-12-02 LAB — EXERCISE TOLERANCE TEST
CHL CUP RESTING HR STRESS: 62 {beats}/min
CHL CUP STRESS STAGE 1 DBP: 80 mmHg
CHL CUP STRESS STAGE 1 SBP: 152 mmHg
CHL CUP STRESS STAGE 3 GRADE: 0 %
CHL CUP STRESS STAGE 4 GRADE: 10 %
CHL CUP STRESS STAGE 4 HR: 106 {beats}/min
CHL CUP STRESS STAGE 4 SBP: 158 mmHg
CHL CUP STRESS STAGE 5 DBP: 75 mmHg
CHL CUP STRESS STAGE 5 GRADE: 12 %
CHL CUP STRESS STAGE 5 SBP: 190 mmHg
CHL CUP STRESS STAGE 6 HR: 164 {beats}/min
CHL CUP STRESS STAGE 6 SBP: 227 mmHg
CHL CUP STRESS STAGE 7 SPEED: 3.4 mph
CHL CUP STRESS STAGE 8 DBP: 73 mmHg
CHL CUP STRESS STAGE 8 GRADE: 0 %
CHL CUP STRESS STAGE 8 HR: 123 {beats}/min
CHL CUP STRESS STAGE 8 SPEED: 1.5 mph
CHL CUP STRESS STAGE 9 DBP: 79 mmHg
CHL CUP STRESS STAGE 9 GRADE: 0 %
CHL CUP STRESS STAGE 9 HR: 78 {beats}/min
CHL CUP STRESS STAGE 9 SBP: 161 mmHg
CHL CUP STRESS STAGE 9 SPEED: 0 mph
CHL RATE OF PERCEIVED EXERTION: 16
CSEPEDS: 0 s
CSEPHR: 100 %
CSEPPMHR: 100 %
Estimated workload: 10.1 METS
Exercise duration (min): 9 min
MPHR: 163 {beats}/min
Peak HR: 164 {beats}/min
Stage 1 Grade: 0 %
Stage 1 HR: 82 {beats}/min
Stage 1 Speed: 0 mph
Stage 2 Grade: 0 %
Stage 2 HR: 74 {beats}/min
Stage 2 Speed: 0.8 mph
Stage 3 HR: 74 {beats}/min
Stage 3 Speed: 1 mph
Stage 4 DBP: 73 mmHg
Stage 4 Speed: 1.7 mph
Stage 5 HR: 125 {beats}/min
Stage 5 Speed: 2.5 mph
Stage 6 DBP: 76 mmHg
Stage 6 Grade: 14 %
Stage 6 Speed: 3.4 mph
Stage 7 Grade: 14 %
Stage 7 HR: 164 {beats}/min
Stage 8 SBP: 216 mmHg

## 2015-12-15 NOTE — Telephone Encounter (Signed)
Cologuard order initiated via [employer] portal. Order number: [id number]. Order confirmation sent for scanning.

## 2015-12-16 ENCOUNTER — Ambulatory Visit (INDEPENDENT_AMBULATORY_CARE_PROVIDER_SITE_OTHER): Payer: BLUE CROSS/BLUE SHIELD | Admitting: Family

## 2015-12-16 ENCOUNTER — Encounter: Payer: Self-pay | Admitting: Family

## 2015-12-16 VITALS — BP 138/70 | HR 81 | Temp 98.0°F | Resp 18 | Ht 70.0 in | Wt 179.8 lb

## 2015-12-16 DIAGNOSIS — I10 Essential (primary) hypertension: Secondary | ICD-10-CM | POA: Diagnosis not present

## 2015-12-16 DIAGNOSIS — R002 Palpitations: Secondary | ICD-10-CM

## 2015-12-16 LAB — BASIC METABOLIC PANEL
BUN: 14 mg/dL (ref 6–23)
CHLORIDE: 99 meq/L (ref 96–112)
CO2: 32 meq/L (ref 19–32)
CREATININE: 0.72 mg/dL (ref 0.40–1.20)
Calcium: 10.5 mg/dL (ref 8.4–10.5)
GFR: 88.64 mL/min (ref >60.00)
Glucose, Bld: 97 mg/dL (ref 70–99)
Potassium: 4 mEq/L (ref 3.5–5.1)
Sodium: 139 mEq/L (ref 135–145)

## 2015-12-16 LAB — CBC WITH DIFFERENTIAL/PLATELET
BASOS PCT: 0.8 % (ref 0.0–3.0)
Basophils Absolute: 0.1 10*3/uL (ref 0.0–0.1)
EOS ABS: 0.1 10*3/uL (ref 0.0–0.7)
EOS PCT: 1.5 % (ref 0.0–5.0)
HEMATOCRIT: 41.6 % (ref 36.0–46.0)
Hemoglobin: 14.1 g/dL (ref 12.0–15.0)
LYMPHS PCT: 22 % (ref 12.0–46.0)
Lymphs Abs: 1.6 10*3/uL (ref 0.7–4.0)
MCHC: 33.8 g/dL (ref 30.0–36.0)
MCV: 92 fl (ref 78.0–100.0)
MONOS PCT: 7.5 % (ref 3.0–12.0)
Monocytes Absolute: 0.6 10*3/uL (ref 0.1–1.0)
NEUTROS ABS: 5.1 10*3/uL (ref 1.4–7.7)
Neutrophils Relative %: 68.2 % (ref 43.0–77.0)
PLATELETS: 267 10*3/uL (ref 150.0–400.0)
RBC: 4.53 Mil/uL (ref 3.87–5.11)
RDW: 14.1 % (ref 11.5–15.5)
WBC: 7.4 10*3/uL (ref 4.0–10.5)

## 2015-12-16 LAB — TSH: TSH: 1.61 u[IU]/mL (ref 0.35–4.50)

## 2015-12-16 NOTE — Patient Instructions (Signed)
Please complete lab work prior to leaving. Continue your current blood pressure medication. Purchase a new BP cuff (arm cuff).

## 2015-12-16 NOTE — Progress Notes (Signed)
Pre visit review using our clinic review tool, if applicable. No additional management support is needed unless otherwise documented below in the visit note. 

## 2015-12-16 NOTE — Assessment & Plan Note (Signed)
BP looks ok today in the office. I rechecked her BP manually and it was 142/85.  When she checked with her automated cuff on the same arm BP 190/110. I have advised pt to continue hctz, obtain a new bp cuff (arm cuff not wrist cuff).

## 2015-12-16 NOTE — Progress Notes (Signed)
Subjective:    Patient ID: Lauren Walters, female    DOB: 04-Oct-1960, 55 y.o.   MRN: [id number]  HPI  Lauren Walters is a 55 yr old female who presents today for follow up of her hypertension.  1) HTN- currently maintained on hctz 25mg .  BP Readings from Last 3 Encounters:  12/16/15 138/70  11/11/15 138/80  07/15/15 118/78   Reports that yesterday she checked her blood pressure at home 163/91 when she got up.  2:15 yesterday afternoon 150/90, a few hours before that 179/96 and the other arm 168/92.  She reports intermittent palpitations.   Review of Systems    see HPI  Past Medical History:  Diagnosis Date  . Arthritis   . History of chicken pox      Social History   Social History  . Marital status: Married    Spouse name: N/A  . Number of children: N/A  . Years of education: N/A   Occupational History  . Not on file.   Social History Main Topics  . Smoking status: Former Smoker    Quit date: 05/17/2015  . Smokeless tobacco: Not on file  . Alcohol use 8.4 oz/week    14 Glasses of wine per week  . Drug use: No  . Sexual activity: Not on file   Other Topics Concern  . 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 CESAREAN SECTION  1995  . CESAREAN SECTION  1997  . CHOLECYSTECTOMY  1991  . JOINT REPLACEMENT     R hip  . OVARIAN CYST REMOVAL  1990  . TOTAL HIP ARTHROPLASTY  2011    Family History  Problem Relation Age of Onset  . Alcohol abuse Father     deceased  . Cancer Father     prostate, basal cell   . Arthritis Father   . Heart disease Father   . Arthritis Paternal Grandfather   . Heart disease Paternal Grandfather   . Heart disease Maternal Grandfather     Allergies  Allergen Reactions  . Metoprolol Succinate Er Other (See  Comments)    Chest pain, increased palpitations    Current Outpatient Prescriptions on File Prior to Visit  Medication Sig Dispense Refill  . estradiol (ESTRING) 2 MG vaginal ring PLACE 2 MG VAGINALLY EVERY 3 (THREE) MONTHS. FOLLOW PACKAGE DIRECTIONS 1 each 3  . hydrochlorothiazide (HYDRODIURIL) 25 MG tablet Take 1 tablet (25 mg total) by mouth daily. 90 tablet 1  . omeprazole (PRILOSEC) 40 MG capsule Take 1 capsule (40 mg total) by mouth daily. 30 capsule 3  . Calcium Carbonate-Vitamin D (CALTRATE 600+D) 600-400 MG-UNIT tablet Take 1 tablet by mouth 2 (two) times daily. (Patient not taking: Reported on 12/16/2015)    . Multiple Vitamins-Minerals (CENTRUM SILVER PO) Take 1 tablet by mouth daily.     No current facility-administered medications on file prior to visit.     BP 138/70 (BP Location: Left Arm, Patient Position: Sitting, Cuff Size: Normal)   Pulse 81   Temp 98 F (36.7 C) (Oral)   Resp 18   Ht 5\' 10"  (1.778 m)   Wt 179 lb 12.8 oz (81.6 kg)   SpO2 99%   BMI 25.80  kg/m    Objective:   Physical Exam  Constitutional: She is oriented to person, place, and time. She appears well-developed and well-nourished.  HENT:  Head: Normocephalic and atraumatic.  Cardiovascular: Normal rate, regular rhythm and normal heart sounds.   No murmur heard. Pulmonary/Chest: Effort normal and breath sounds normal. No respiratory distress. She has no wheezes.  Musculoskeletal: She exhibits no edema.  Neurological: She is alert and oriented to person, place, and time.  Skin: Skin is warm and dry.  Psychiatric: She has a normal mood and affect. Her behavior is normal. Judgment and thought content normal.          Assessment & Plan:  Palpitations (new)-  check TSH, CBC, BMET.  EKG today (personally reviewed and notes NSR). Advised patient to let me know if symptoms persist- if so we will order a holter monitor for further evaluation.

## 2016-01-04 ENCOUNTER — Encounter: Payer: Self-pay | Admitting: Family

## 2016-01-10 LAB — COLOGUARD
Cologuard: POSITIVE
Cologuard: POSITIVE

## 2016-01-11 ENCOUNTER — Telehealth: Payer: Self-pay | Admitting: *Deleted

## 2016-01-11 DIAGNOSIS — R195 Other fecal abnormalities: Secondary | ICD-10-CM

## 2016-01-11 NOTE — Telephone Encounter (Signed)
Please advise patient that her cologuard test is +. I would like her to see GI for colonoscopy.

## 2016-01-11 NOTE — Telephone Encounter (Signed)
Lab called to report a positive cologuard . Agency to  fax results to [phone]

## 2016-01-11 NOTE — Telephone Encounter (Signed)
Notified pt and she is agreeable to proceed with GI referral.

## 2016-01-12 ENCOUNTER — Encounter: Payer: Self-pay | Admitting: Gastroenterology

## 2016-01-12 ENCOUNTER — Encounter: Payer: Self-pay | Admitting: Family

## 2016-01-15 ENCOUNTER — Encounter: Payer: Self-pay | Admitting: Gastroenterology

## 2016-01-23 ENCOUNTER — Encounter: Payer: Self-pay | Admitting: Family

## 2016-01-24 ENCOUNTER — Ambulatory Visit (AMBULATORY_SURGERY_CENTER): Payer: Self-pay | Admitting: *Deleted

## 2016-01-24 VITALS — Ht 70.0 in | Wt 182.0 lb

## 2016-01-24 DIAGNOSIS — Z1211 Encounter for screening for malignant neoplasm of colon: Secondary | ICD-10-CM

## 2016-01-24 MED ORDER — NA SULFATE-K SULFATE-MG SULF 17.5-3.13-1.6 GM/177ML PO SOLN: 1.0000 | Freq: Once | ORAL | 0 refills | Status: AC

## 2016-01-24 NOTE — Progress Notes (Signed)
No egg or soy allergy. No anesthesia problems.  No home O2.  No diet meds.  

## 2016-01-27 ENCOUNTER — Ambulatory Visit (INDEPENDENT_AMBULATORY_CARE_PROVIDER_SITE_OTHER): Payer: BLUE CROSS/BLUE SHIELD | Admitting: Family

## 2016-01-27 ENCOUNTER — Encounter: Payer: Self-pay | Admitting: Family

## 2016-01-27 DIAGNOSIS — I10 Essential (primary) hypertension: Secondary | ICD-10-CM

## 2016-01-27 NOTE — Assessment & Plan Note (Signed)
SBP mildly elevated today. Will continue hctz at current dose since outside bp's have been much better.  Plan follow up in 3 months.

## 2016-01-27 NOTE — Progress Notes (Signed)
Subjective:    Patient ID: TALECIA GREUNKE, female    DOB: 11-29-1960, 55 y.o.   MRN: [id number]  HPI  Ms. Millage is a 55 yr old female who presents today for follow up. She reports that she has been checking her BP's at work from time to time.  Typically SBP measurements have been 120's to 130's.    BP Readings from Last 3 Encounters:  01/27/16 (!) 142/81  12/16/15 138/70  11/11/15 138/80    Review of Systems    see HPI  Past Medical History:  Diagnosis Date  . Arthritis   . GERD (gastroesophageal reflux disease)   . History of chicken pox   . Hypertension   . Osteopenia      Social History   Social History  . Marital status: Married    Spouse name: N/A  . Number of children: N/A  . Years of education: N/A   Occupational History  . Not on file.   Social History Main Topics  . Smoking status: Former Smoker    Quit date: 05/17/2015  . Smokeless tobacco: Never Used  . Alcohol use 8.4 oz/week    14 Glasses of wine per week  . Drug use: No  . Sexual activity: Not on file   Other Topics Concern  . 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 CESAREAN SECTION  1995  . CESAREAN SECTION  1997  . CHOLECYSTECTOMY  1991  . JOINT REPLACEMENT     R hip  . OVARIAN CYST REMOVAL  1990  . TOTAL HIP ARTHROPLASTY  2011  . WISDOM TOOTH EXTRACTION      Family History  Problem Relation Age of Onset  . Alcohol abuse Father     deceased  . Cancer Father     prostate, basal cell   . Arthritis Father   . Heart disease Father   . Arthritis Paternal Grandfather   . Heart disease Paternal Grandfather   . Heart disease Maternal Grandfather   . Colon cancer Neg Hx     Allergies  Allergen Reactions  . Metoprolol Succinate Er Other (See Comments)    Chest  pain, increased palpitations    Current Outpatient Prescriptions on File Prior to Visit  Medication Sig Dispense Refill  . Calcium Carbonate-Vitamin D (CALTRATE 600+D) 600-400 MG-UNIT tablet Take 1 tablet by mouth 2 (two) times daily.    Marland Kitchen estradiol (ESTRING) 2 MG vaginal ring PLACE 2 MG VAGINALLY EVERY 3 (THREE) MONTHS. FOLLOW PACKAGE DIRECTIONS 1 each 3  . hydrochlorothiazide (HYDRODIURIL) 25 MG tablet Take 1 tablet (25 mg total) by mouth daily. 90 tablet 1  . Lifitegrast (XIIDRA OP) Apply to eye.    . loteprednol (LOTEMAX) 0.5 % ophthalmic suspension 4 (four) times daily.    . Multiple Vitamins-Minerals (CENTRUM SILVER PO) Take 1 tablet by mouth daily.    Marland Kitchen omeprazole (PRILOSEC) 40 MG capsule Take 1 capsule (40 mg total) by mouth daily. 30 capsule 3  . amoxicillin (AMOXIL) 500 MG tablet Take 500 mg by mouth.     No current facility-administered medications on file prior to visit.     BP (!) 142/81 (BP Location: Right Arm, Cuff Size: Normal)   Pulse  62   Temp 98.4 F (36.9 C) (Oral)   Resp 16   Ht 5\' 10"  (1.778 m)   Wt 183 lb 9.6 oz (83.3 kg)   BMI 26.34 kg/m    Objective:   Physical Exam  Constitutional: She is oriented to person, place, and time. She appears well-developed and well-nourished. No distress.  Pulmonary/Chest: Effort normal.  Musculoskeletal: She exhibits no edema.  Neurological: She is alert and oriented to person, place, and time.          Assessment & Plan:

## 2016-01-27 NOTE — Progress Notes (Signed)
Pre visit review using our clinic review tool, if applicable. No additional management support is needed unless otherwise documented below in the visit note. 

## 2016-02-01 ENCOUNTER — Encounter: Payer: Self-pay | Admitting: Gastroenterology

## 2016-02-01 ENCOUNTER — Ambulatory Visit (AMBULATORY_SURGERY_CENTER): Payer: BLUE CROSS/BLUE SHIELD | Admitting: Gastroenterology

## 2016-02-01 VITALS — BP 110/58 | HR 52 | Temp 97.1°F | Resp 13 | Ht 70.0 in | Wt 183.0 lb

## 2016-02-01 DIAGNOSIS — K635 Polyp of colon: Secondary | ICD-10-CM | POA: Diagnosis not present

## 2016-02-01 DIAGNOSIS — D125 Benign neoplasm of sigmoid colon: Secondary | ICD-10-CM

## 2016-02-01 DIAGNOSIS — R195 Other fecal abnormalities: Secondary | ICD-10-CM

## 2016-02-01 DIAGNOSIS — Z1211 Encounter for screening for malignant neoplasm of colon: Secondary | ICD-10-CM

## 2016-02-01 DIAGNOSIS — D122 Benign neoplasm of ascending colon: Secondary | ICD-10-CM | POA: Diagnosis not present

## 2016-02-01 DIAGNOSIS — D127 Benign neoplasm of rectosigmoid junction: Secondary | ICD-10-CM

## 2016-02-01 MED ORDER — SODIUM CHLORIDE 0.9 % IV SOLN: 500.0000 mL | INTRAVENOUS | Status: DC

## 2016-02-01 NOTE — Progress Notes (Signed)
Called to room to assist during endoscopic procedure.  Patient ID and intended procedure confirmed with present staff. Received instructions for my participation in the procedure from the performing physician.  

## 2016-02-01 NOTE — Patient Instructions (Signed)
YOU HAD AN ENDOSCOPIC PROCEDURE TODAY AT THE [facility]:   Refer to the procedure report that was given to you for any specific questions about what was found during the examination.  If the procedure report does not answer your questions, please call your gastroenterologist to clarify.  If you requested that your care partner not be given the details of your procedure findings, then the procedure report has been included in a sealed envelope for you to review at your convenience later.  YOU SHOULD EXPECT: Some feelings of bloating in the abdomen. Passage of more gas than usual.  Walking can help get rid of the air that was put into your GI tract during the procedure and reduce the bloating. If you had a lower endoscopy (such as a colonoscopy or flexible sigmoidoscopy) you may notice spotting of blood in your stool or on the toilet paper. If you underwent a bowel prep for your procedure, you may not have a normal bowel movement for a few days.  Please Note:  You might notice some irritation and congestion in your nose or some drainage.  This is from the oxygen used during your procedure.  There is no need for concern and it should clear up in a day or so.  SYMPTOMS TO REPORT IMMEDIATELY:   Following lower endoscopy (colonoscopy or flexible sigmoidoscopy):  Excessive amounts of blood in the stool  Significant tenderness or worsening of abdominal pains  Swelling of the abdomen that is new, acute  Fever of 100F or higher   For urgent or emergent issues, a gastroenterologist can be reached at any hour by calling [phone]. Please read all handouts given to you by your recovery nurse.   DIET:  We do recommend a small meal at first, but then you may proceed to your regular diet.  Drink plenty of fluids but you should avoid alcoholic beverages for 24 hours.  ACTIVITY:  You should plan to take it easy for the rest of today and you should NOT DRIVE or use heavy machinery until  tomorrow (because of the sedation medicines used during the test).    FOLLOW UP: Our staff will call the number listed on your records the next business day following your procedure to check on you and address any questions or concerns that you may have regarding the information given to you following your procedure. If we do not reach you, we will leave a message.  However, if you are feeling well and you are not experiencing any problems, there is no need to return our call.  We will assume that you have returned to your regular daily activities without incident.  If any biopsies were taken you will be contacted by phone or by letter within the next 1-3 weeks.  Please call us at [phone] if you have not heard about the biopsies in 3 weeks.    SIGNATURES/CONFIDENTIALITY: You and/or your care partner have signed paperwork which will be entered into your electronic medical record.  These signatures attest to the fact that that the information above on your After Visit Summary has been reviewed and is understood.  Full responsibility of the confidentiality of this discharge information lies with you and/or your care-partner.  Thank you for letting us take care of your healthcare needs today. 

## 2016-02-01 NOTE — Progress Notes (Signed)
To PACU, vss patent aw report to rn 

## 2016-02-01 NOTE — Op Note (Signed)
[facility] Patient Name: Lauren Walters Procedure Date: 02/01/2016 3:13 PM MRN: [id number] Endoscopist: Mauri Pole , MD Age: 55 Referring MD:  Date of Birth: [birth year] Gender: Female Account #: [id number] Procedure:                Colonoscopy Indications:              This is the patient's first colonoscopy, Positive                            Cologuard test, Positive fecal immunochemical test Medicines:                Monitored Anesthesia Care Procedure:                Pre-Anesthesia Assessment:                           - Prior to the procedure, a History and Physical                            was performed, and patient medications and                            allergies were reviewed. The patient's tolerance of                            previous anesthesia was also reviewed. The risks                            and benefits of the procedure and the sedation                            options and risks were discussed with the patient.                            All questions were answered, and informed consent                            was obtained. Prior Anticoagulants: The patient has                            taken no previous anticoagulant or antiplatelet                            agents. ASA Grade Assessment: II - A patient with                            mild systemic disease. After reviewing the risks                            and benefits, the patient was deemed in                            satisfactory condition to undergo the procedure.                           -  Prior to the procedure, a History and Physical                            was performed, and patient medications and                            allergies were reviewed. The patient's tolerance of                            previous anesthesia was also reviewed. The risks                            and benefits of the procedure and the sedation                            options and  risks were discussed with the patient.                            All questions were answered, and informed consent                            was obtained. Prior Anticoagulants: The patient has                            taken no previous anticoagulant or antiplatelet                            agents. ASA Grade Assessment: II - A patient with                            mild systemic disease. After reviewing the risks                            and benefits, the patient was deemed in                            satisfactory condition to undergo the procedure.                           After obtaining informed consent, the colonoscope                            was passed under direct vision. Throughout the                            procedure, the patient's blood pressure, pulse, and                            oxygen saturations were monitored continuously. The                            Model PCF-[id number] scope was introduced  through the anus and advanced to the the terminal                            ileum, with identification of the appendiceal                            orifice and IC valve. The colonoscopy was performed                            without difficulty. The patient tolerated the                            procedure well. The quality of the bowel                            preparation was good. The terminal ileum, ileocecal                            valve, appendiceal orifice, and rectum were                            photographed. Scope In: 3:26:09 PM Scope Out: 3:56:44 PM Scope Withdrawal Time: 0 hours 20 minutes 51 seconds  Total Procedure Duration: 0 hours 30 minutes 35 seconds  Findings:                 The perianal and digital rectal examinations were                            normal.                           A 8 mm polyp was found in the ascending colon. The                            polyp was sessile. The polyp was  removed with a                            cold snare. Resection and retrieval were complete.                           A 2 mm polyp was found in the sigmoid colon. The                            polyp was sessile. The polyp was removed with a                            cold biopsy forceps. Resection and retrieval were                            complete.                           A 14 mm polyp was found in the recto-sigmoid colon.  The polyp was semi-sessile. The polyp was removed                            with a hot snare. Resection and retrieval were                            complete.                           Multiple small and large-mouthed diverticula were                            found in the entire colon. Complications:            No immediate complications. Estimated Blood Loss:     Estimated blood loss was minimal. Impression:               - One 8 mm polyp in the ascending colon, removed                            with a cold snare. Resected and retrieved.                           - One 2 mm polyp in the sigmoid colon, removed with                            a cold biopsy forceps. Resected and retrieved.                           - One 14 mm polyp at the recto-sigmoid colon,                            removed with a hot snare. Resected and retrieved.                           - Diverticulosis in the entire examined colon. Recommendation:           - Patient has a contact number available for                            emergencies. The signs and symptoms of potential                            delayed complications were discussed with the                            patient. Return to normal activities tomorrow.                            Written discharge instructions were provided to the                            patient.                           -  Resume previous diet.                           - Continue present medications.                            - Await pathology results.                           - Repeat colonoscopy in 3 - 5 years for                            surveillance based on pathology results. Mauri Pole, MD 02/01/2016 4:03:44 PM This report has been signed electronically.

## 2016-02-02 ENCOUNTER — Telehealth: Payer: Self-pay

## 2016-02-02 NOTE — Telephone Encounter (Signed)
  Follow up Call-  Call back number 02/01/2016  Post procedure Call Back phone  # [phone]  Permission to leave phone message Yes  Some recent data might be hidden    Patient was called for follow up after her procedure on 02/01/2016. No answer at the number given for follow up phone call. A message was left on the answering machine.

## 2016-02-02 NOTE — Telephone Encounter (Signed)
  Follow up Call-  Call back number 02/01/2016  Post procedure Call Back phone  # [phone]  Permission to leave phone message Yes  Some recent data might be hidden    Patient was called for follow up after her procedure on 02/01/2016. No answer at the number given for follow up phone call. A message was left on the answering machine.Marland Kitchen

## 2016-02-10 ENCOUNTER — Encounter: Payer: Self-pay | Admitting: Gastroenterology

## 2016-02-26 ENCOUNTER — Other Ambulatory Visit: Payer: Self-pay | Admitting: Family

## 2016-03-03 ENCOUNTER — Other Ambulatory Visit: Payer: Self-pay | Admitting: Family

## 2016-03-09 ENCOUNTER — Encounter: Payer: BLUE CROSS/BLUE SHIELD | Admitting: Gastroenterology

## 2016-03-16 ENCOUNTER — Other Ambulatory Visit: Payer: Self-pay | Admitting: Family

## 2016-05-11 ENCOUNTER — Encounter: Payer: Self-pay | Admitting: Family

## 2016-05-11 ENCOUNTER — Ambulatory Visit (INDEPENDENT_AMBULATORY_CARE_PROVIDER_SITE_OTHER): Payer: Managed Care, Other (non HMO) | Admitting: Family

## 2016-05-11 ENCOUNTER — Other Ambulatory Visit (HOSPITAL_COMMUNITY)
Admission: RE | Admit: 2016-05-11 | Discharge: 2016-05-11 | Disposition: A | Discharge status: Home / Self Care | Payer: Managed Care, Other (non HMO) | Source: Ambulatory Visit | Attending: Family | Admitting: Family

## 2016-05-11 VITALS — BP 142/90 | HR 76 | Temp 98.1°F | Resp 18 | Ht 70.0 in | Wt 184.0 lb

## 2016-05-11 DIAGNOSIS — Z01419 Encounter for gynecological examination (general) (routine) without abnormal findings: Secondary | ICD-10-CM

## 2016-05-11 DIAGNOSIS — Z0000 Encounter for general adult medical examination without abnormal findings: Secondary | ICD-10-CM

## 2016-05-11 DIAGNOSIS — Z1151 Encounter for screening for human papillomavirus (HPV): Secondary | ICD-10-CM | POA: Insufficient documentation

## 2016-05-11 LAB — URINALYSIS, ROUTINE W REFLEX MICROSCOPIC
Bilirubin Urine: NEGATIVE
Hgb urine dipstick: NEGATIVE
Ketones, ur: NEGATIVE
LEUKOCYTES UA: NEGATIVE
NITRITE: NEGATIVE
Total Protein, Urine: NEGATIVE
Urine Glucose: NEGATIVE
Urobilinogen, UA: 0.2 (ref 0.0–1.0)
pH: 6.5 (ref 5.0–8.0)

## 2016-05-11 LAB — HEPATIC FUNCTION PANEL
ALBUMIN: 4.6 g/dL (ref 3.5–5.2)
ALK PHOS: 53 U/L (ref 39–117)
ALT: 15 U/L (ref 0–35)
AST: 22 U/L (ref 0–37)
BILIRUBIN TOTAL: 0.4 mg/dL (ref 0.2–1.2)
Bilirubin, Direct: 0.1 mg/dL (ref 0.0–0.3)
Total Protein: 7.3 g/dL (ref 6.0–8.3)

## 2016-05-11 LAB — BASIC METABOLIC PANEL
BUN: 17 mg/dL (ref 6–23)
CALCIUM: 9.9 mg/dL (ref 8.4–10.5)
CO2: 31 meq/L (ref 19–32)
Chloride: 94 mEq/L — ABNORMAL LOW (ref 96–112)
Creatinine, Ser: 0.73 mg/dL (ref 0.40–1.20)
GFR: 87.12 mL/min (ref >60.00)
GLUCOSE: 88 mg/dL (ref 70–99)
Potassium: 3.2 mEq/L — ABNORMAL LOW (ref 3.5–5.1)
SODIUM: 133 meq/L — AB (ref 135–145)

## 2016-05-11 LAB — CBC WITH DIFFERENTIAL/PLATELET
BASOS ABS: 0.1 10*3/uL (ref 0.0–0.1)
Basophils Relative: 0.8 % (ref 0.0–3.0)
Eosinophils Absolute: 0.1 10*3/uL (ref 0.0–0.7)
Eosinophils Relative: 1.6 % (ref 0.0–5.0)
HCT: 40.7 % (ref 36.0–46.0)
Hemoglobin: 13.7 g/dL (ref 12.0–15.0)
LYMPHS ABS: 1.4 10*3/uL (ref 0.7–4.0)
Lymphocytes Relative: 18.1 % (ref 12.0–46.0)
MCHC: 33.7 g/dL (ref 30.0–36.0)
MCV: 91.8 fl (ref 78.0–100.0)
MONO ABS: 0.5 10*3/uL (ref 0.1–1.0)
MONOS PCT: 7 % (ref 3.0–12.0)
NEUTROS ABS: 5.4 10*3/uL (ref 1.4–7.7)
NEUTROS PCT: 72.5 % (ref 43.0–77.0)
PLATELETS: 290 10*3/uL (ref 150.0–400.0)
RBC: 4.43 Mil/uL (ref 3.87–5.11)
RDW: 14.1 % (ref 11.5–15.5)
WBC: 7.5 10*3/uL (ref 4.0–10.5)

## 2016-05-11 LAB — LIPID PANEL
Cholesterol: 216 mg/dL — ABNORMAL HIGH (ref 0–200)
HDL: 101.4 mg/dL (ref >39.00)
LDL CALC: 102 mg/dL — AB (ref 0–99)
NonHDL: 114.2
Total CHOL/HDL Ratio: 2
Triglycerides: 60 mg/dL (ref 0.0–149.0)
VLDL: 12 mg/dL (ref 0.0–40.0)

## 2016-05-11 LAB — TSH: TSH: 2.26 u[IU]/mL (ref 0.35–4.50)

## 2016-05-11 MED ORDER — OMEPRAZOLE 40 MG PO CPDR: 40.0000 mg | DELAYED_RELEASE_CAPSULE | Freq: Every day | ORAL | 1 refills | Status: DC

## 2016-05-11 NOTE — Addendum Note (Signed)
Addended by: Kelle Darting A on: 05/11/2016 09:29 AM   Modules accepted: Orders

## 2016-05-11 NOTE — Progress Notes (Signed)
Pre visit review using our clinic review tool, if applicable. No additional management support is needed unless otherwise documented below in the visit note. 

## 2016-05-11 NOTE — Patient Instructions (Addendum)
Consider adding lisinopril- send me a mychart message and let me know if you are agreeable to begin. Complete lab work prior to leaving Keep up the good work with healthy diet and exercise. Marland Kitchen

## 2016-05-11 NOTE — Progress Notes (Signed)
Subjective:    Patient ID: Lauren Walters, female    DOB: 09/19/1960, 56 y.o.   MRN: [id number]  HPI  Patient presents today for complete physical.  Immunizations: up to date  Diet: eating healthy, has cut downon "bd carbs."   Exercise:  joined [employer] and has been exercising 3-5 days a week Wt Readings from Last 3 Encounters:  05/11/16 184 lb (83.5 kg)  02/01/16 183 lb (83 kg)  01/27/16 183 lb 9.6 oz (83.3 kg)  Colonoscopy: 12/17 Dexa: 1/17 Pap Smear:07/17/12 Mammogram:  Due  HTN- continues hctz. Reports that it does give her dry mouth. Checks her BP at work sometimes. Notes 125-135/70-80, a bit higher after her 140's/90's.   BP Readings from Last 3 Encounters:  05/11/16 (!) 142/90  02/01/16 (!) 110/58  01/27/16 (!) 142/81     Review of Systems  Constitutional: Negative for unexpected weight change.  HENT: Negative for hearing loss and rhinorrhea.   Eyes: Negative for visual disturbance.  Respiratory: Negative for cough.   Cardiovascular: Negative for leg swelling.  Gastrointestinal: Negative for constipation and diarrhea.  Genitourinary: Negative for dysuria, frequency and hematuria.  Musculoskeletal: Negative for arthralgias and myalgias.  Skin: Negative for rash.  Neurological: Negative for headaches.  Hematological: Negative for adenopathy.  Psychiatric/Behavioral:       Denies depression/anxiety   Past Medical History:  Diagnosis Date  . Arthritis   . GERD (gastroesophageal reflux disease)   . History of chicken pox   . Hypertension   . Osteopenia      Social History   Social History  . Marital status: Married    Spouse name: N/A  . Number of children: N/A  . Years of education: N/A   Occupational History  . Not on file.   Social History Main Topics  . Smoking status: Former Smoker    Quit date: 05/17/2015  . Smokeless tobacco: Never Used  . Alcohol use 8.4 oz/week    14 Glasses of wine per week     Comment: 2 glasses of wine a  night  . Drug use: No  . Sexual activity: Not on file   Other Topics Concern  . 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 CESAREAN SECTION  1995  . CESAREAN SECTION  1997  . CHOLECYSTECTOMY  1991  . JOINT REPLACEMENT     R hip  . OVARIAN CYST REMOVAL  1990  . ROOT CANAL    . TOTAL HIP ARTHROPLASTY  2011  . WISDOM TOOTH EXTRACTION      Family History  Problem Relation Age of Onset  . Alcohol abuse Father     deceased  . Cancer Father     prostate, basal cell   . Arthritis Father   . Heart disease Father   . Arthritis Paternal Grandfather   . Heart disease Paternal Grandfather   . Dementia Mother   . Hypertension Mother   . Heart disease Maternal Grandfather   . Colon cancer Neg Hx     Allergies  Allergen Reactions  . Metoprolol Tartrate Other (See Comments)    Chest pain, increased palpitations    Current Outpatient Prescriptions on File Prior to Visit  Medication Sig  Dispense Refill  . amoxicillin (AMOXIL) 500 MG tablet Take 500 mg by mouth. PRIOR TO DENTAL APPTS    . Calcium Carbonate-Vitamin D (CALTRATE 600+D) 600-400 MG-UNIT tablet Take 1 tablet by mouth 2 (two) times daily.    Marland Kitchen ESTRING 2 MG vaginal ring PLACE 1 RING VAGINALLY EVERY 3 MONTHS AS DIRECTED 1 each 3  . hydrochlorothiazide (HYDRODIURIL) 25 MG tablet TAKE 1 TABLET BY MOUTH EVERY DAY 90 tablet 1  . Multiple Vitamins-Minerals (CENTRUM SILVER PO) Take 1 tablet by mouth daily.     Current Facility-Administered Medications on File Prior to Visit  Medication Dose Route Frequency Provider Last Rate Last Dose  . 0.9 %  sodium chloride infusion  500 mL Intravenous Continuous Kavitha Nandigam V, MD        BP (!) 142/90   Pulse 76   Temp 98.1 F (36.7 C) (Oral)   Resp 18   Ht 5\' 10"  (1.778 m)    Wt 184 lb (83.5 kg)   SpO2 100% Comment: room air  BMI 26.40 kg/m       Objective:   Physical Exam  Physical Exam  Constitutional: She is oriented to person, place, and time. She appears well-developed and well-nourished. No distress.  HENT:  Head: Normocephalic and atraumatic.  Right Ear: Tympanic membrane and ear canal normal.  Left Ear: Tympanic membrane and ear canal normal.  Mouth/Throat: Oropharynx is clear and moist.  Eyes: Pupils are equal, round, and reactive to light. No scleral icterus.  Neck: Normal range of motion. No thyromegaly present.  Cardiovascular: Normal rate and regular rhythm.   No murmur heard. Pulmonary/Chest: Effort normal and breath sounds normal. No respiratory distress. He has no wheezes. She has no rales. She exhibits no tenderness.  Abdominal: Soft. Bowel sounds are normal. She exhibits no distension and no mass. There is no tenderness. There is no rebound and no guarding.  Musculoskeletal: She exhibits no edema.  Lymphadenopathy:    She has no cervical adenopathy.  Neurological: She is alert and oriented to person, place, and time. She has normal patellar reflexes. She exhibits normal muscle tone. Coordination normal.  Skin: Skin is warm and dry.  Psychiatric: She has a normal mood and affect. Her behavior is normal. Judgment and thought content normal.  Breasts: Examined lying Right: Without masses, retractions, discharge or axillary adenopathy.  Left: Without masses, retractions, discharge or axillary adenopathy.  Inguinal/mons: Normal without inguinal adenopathy  External genitalia: Normal  BUS/Urethra/Skene's glands: Normal  Bladder: Normal  Vagina: Normal  Cervix: Normal  Uterus: normal in size, shape and contour. Midline and mobile  Adnexa/parametria:  Rt: Without masses or tenderness.  Lt: Without masses or tenderness.  Anus and perineum: Normal            Assessment & Plan:   Preventative Care- encouraged pt to continue  healthy diet, exercise.  Pap performed today. Obtain routine lab work.   HTN- slightly above goal. We discussed adding lisinopril 5mg  once daily.  She wants to do some research and get back to me.      Assessment & Plan:

## 2016-05-12 LAB — HEPATITIS C ANTIBODY: HCV AB: NEGATIVE

## 2016-05-13 LAB — HIV ANTIBODY (ROUTINE TESTING W REFLEX): HIV 1&2 Ab, 4th Generation: NONREACTIVE

## 2016-05-15 LAB — CYTOLOGY - PAP
DIAGNOSIS: NEGATIVE
HPV: NOT DETECTED

## 2016-05-20 ENCOUNTER — Encounter: Payer: Self-pay | Admitting: Family

## 2016-05-21 MED ORDER — LISINOPRIL 5 MG PO TABS: 5.0000 mg | ORAL_TABLET | Freq: Every day | ORAL | 3 refills | Status: DC

## 2016-06-22 ENCOUNTER — Encounter: Payer: Self-pay | Admitting: Family

## 2016-06-22 ENCOUNTER — Ambulatory Visit (INDEPENDENT_AMBULATORY_CARE_PROVIDER_SITE_OTHER): Payer: Managed Care, Other (non HMO) | Admitting: Family

## 2016-06-22 VITALS — BP 130/80 | HR 73 | Temp 98.4°F | Resp 16 | Ht 70.0 in | Wt 180.6 lb

## 2016-06-22 DIAGNOSIS — I10 Essential (primary) hypertension: Secondary | ICD-10-CM | POA: Diagnosis not present

## 2016-06-22 DIAGNOSIS — R233 Spontaneous ecchymoses: Secondary | ICD-10-CM

## 2016-06-22 LAB — BASIC METABOLIC PANEL
BUN: 12 mg/dL (ref 6–23)
CHLORIDE: 101 meq/L (ref 96–112)
CO2: 32 mEq/L (ref 19–32)
Calcium: 10 mg/dL (ref 8.4–10.5)
Creatinine, Ser: 0.71 mg/dL (ref 0.40–1.20)
GFR: 89.92 mL/min (ref >60.00)
Glucose, Bld: 95 mg/dL (ref 70–99)
POTASSIUM: 4.4 meq/L (ref 3.5–5.1)
Sodium: 140 mEq/L (ref 135–145)

## 2016-06-22 LAB — CBC WITH DIFFERENTIAL/PLATELET
Basophils Absolute: 0.1 10*3/uL (ref 0.0–0.1)
Basophils Relative: 1.3 % (ref 0.0–3.0)
Eosinophils Absolute: 0.2 10*3/uL (ref 0.0–0.7)
Eosinophils Relative: 2.9 % (ref 0.0–5.0)
HCT: 40.6 % (ref 36.0–46.0)
HEMOGLOBIN: 13.6 g/dL (ref 12.0–15.0)
Lymphocytes Relative: 24.4 % (ref 12.0–46.0)
Lymphs Abs: 1.5 10*3/uL (ref 0.7–4.0)
MCHC: 33.6 g/dL (ref 30.0–36.0)
MCV: 92.4 fl (ref 78.0–100.0)
MONO ABS: 0.5 10*3/uL (ref 0.1–1.0)
Monocytes Relative: 8.7 % (ref 3.0–12.0)
Neutro Abs: 3.8 10*3/uL (ref 1.4–7.7)
Neutrophils Relative %: 62.7 % (ref 43.0–77.0)
Platelets: 263 10*3/uL (ref 150.0–400.0)
RBC: 4.39 Mil/uL (ref 3.87–5.11)
RDW: 13.4 % (ref 11.5–15.5)
WBC: 6 10*3/uL (ref 4.0–10.5)

## 2016-06-22 NOTE — Patient Instructions (Signed)
Please complete lab work prior to leaving.   

## 2016-06-22 NOTE — Progress Notes (Signed)
Subjective:    Patient ID: Lauren Walters, female    DOB: September 05, 1960, 56 y.o.   MRN: [id number]  HPI  HTN- Last visit we added lisinopril. Denies cough BP Readings from Last 3 Encounters:  06/22/16 130/80  05/11/16 (!) 142/90  02/01/16 (!) 110/58   Bleeding/bruising   Review of Systems    see HPI  Past Medical History:  Diagnosis Date  . Arthritis   . GERD (gastroesophageal reflux disease)   . History of chicken pox   . Hypertension   . Osteopenia      Social History   Social History  . Marital status: Married    Spouse name: N/A  . Number of children: N/A  . Years of education: N/A   Occupational History  . Not on file.   Social History Main Topics  . Smoking status: Former Smoker    Quit date: 05/17/2015  . Smokeless tobacco: Never Used  . Alcohol use 8.4 oz/week    14 Glasses of wine per week     Comment: 2 glasses of wine a night  . Drug use: No  . Sexual activity: Not on file   Other Topics Concern  . 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 CESAREAN SECTION  1995  . CESAREAN SECTION  1997  . CHOLECYSTECTOMY  1991  . JOINT REPLACEMENT     R hip  . OVARIAN CYST REMOVAL  1990  . ROOT CANAL    . TOTAL HIP ARTHROPLASTY  2011  . WISDOM TOOTH EXTRACTION      Family History  Problem Relation Age of Onset  . Alcohol abuse Father     deceased  . Cancer Father     prostate, basal cell   . Arthritis Father   . Heart disease Father   . Arthritis Paternal Grandfather   . Heart disease Paternal Grandfather   . Dementia Mother   . Hypertension Mother   . Heart disease Maternal Grandfather   . Colon cancer Neg Hx     Allergies  Allergen Reactions  . Metoprolol Tartrate Other (See Comments)    Chest pain, increased palpitations      Current Outpatient Prescriptions on File Prior to Visit  Medication Sig Dispense Refill  . Calcium Carbonate-Vitamin D (CALTRATE 600+D) 600-400 MG-UNIT tablet Take 1 tablet by mouth 2 (two) times daily.    Marland Kitchen ESTRING 2 MG vaginal ring PLACE 1 RING VAGINALLY EVERY 3 MONTHS AS DIRECTED 1 each 3  . hydrochlorothiazide (HYDRODIURIL) 25 MG tablet TAKE 1 TABLET BY MOUTH EVERY DAY 90 tablet 1  . lisinopril (PRINIVIL,ZESTRIL) 5 MG tablet Take 1 tablet (5 mg total) by mouth daily. 30 tablet 3  . Multiple Vitamins-Minerals (CENTRUM SILVER PO) Take 1 tablet by mouth daily.    Marland Kitchen omeprazole (PRILOSEC) 40 MG capsule Take 1 capsule (40 mg total) by mouth daily. 90 capsule 1   Current Facility-Administered Medications on File Prior to Visit  Medication Dose Route Frequency Provider Last Rate Last Dose  . 0.9 %  sodium chloride infusion  500 mL Intravenous Continuous Kavitha Nandigam V, MD        BP 130/80 (BP Location: Right Arm, Cuff Size: Normal)  Pulse 73   Temp 98.4 F (36.9 C) (Oral)   Resp 16   Ht 5\' 10"  (1.778 m)   Wt 180 lb 9.6 oz (81.9 kg)   SpO2 100% Comment: room air  BMI 25.91 kg/m    Objective:   Physical Exam  Constitutional: She is oriented to person, place, and time. She appears well-developed and well-nourished.  HENT:  Head: Normocephalic and atraumatic.  Cardiovascular: Normal rate, regular rhythm and normal heart sounds.   No murmur heard. Pulmonary/Chest: Effort normal and breath sounds normal. No respiratory distress. She has no wheezes.  Neurological: She is alert and oriented to person, place, and time.  Skin:  Few scattered petechia noted on shins  Psychiatric: She has a normal mood and affect. Her behavior is normal. Judgment and thought content normal.          Assessment & Plan:  Petechiae- mild, will check CBC.  HTN- improved. Obtain follow up bmet. Continue current meds.

## 2016-06-28 ENCOUNTER — Other Ambulatory Visit: Payer: Self-pay

## 2016-06-28 MED ORDER — LISINOPRIL 5 MG PO TABS: 5.0000 mg | ORAL_TABLET | Freq: Every day | ORAL | 1 refills | Status: DC

## 2016-08-17 ENCOUNTER — Ambulatory Visit: Payer: Managed Care, Other (non HMO) | Admitting: Family

## 2016-09-10 ENCOUNTER — Telehealth: Payer: Self-pay | Admitting: *Deleted

## 2016-09-10 MED ORDER — HYDROCHLOROTHIAZIDE 25 MG PO TABS: 25.0000 mg | ORAL_TABLET | Freq: Every day | ORAL | 1 refills | Status: DC

## 2016-09-10 NOTE — Telephone Encounter (Signed)
Faxed refill request received from [facility] for HCTZ 25 mg tab Last filled by MD on 03/16/16, #90x1 Last AEX - 06/22/16 Next AEX - 6-Mths. Refill sent per [facility] refill protocol/SLS

## 2016-09-12 ENCOUNTER — Other Ambulatory Visit: Payer: Self-pay | Admitting: *Deleted

## 2016-09-12 MED ORDER — OMEPRAZOLE 40 MG PO CPDR: 40.0000 mg | DELAYED_RELEASE_CAPSULE | Freq: Every day | ORAL | 1 refills | Status: DC

## 2016-09-12 NOTE — Progress Notes (Signed)
Faxed refill request received from [facility] for 90-day supply Omeprazole 40 mg cap Last filled on 05/11/16, #90x1 Last AEX - 06/22/16 D/C PREVIOUS SCRIPTS FOR THIS MEDICATION Refill sent per [facility] refill protocol/SLS

## 2016-09-24 ENCOUNTER — Encounter: Payer: Self-pay | Admitting: Family Medicine

## 2016-09-24 ENCOUNTER — Ambulatory Visit (INDEPENDENT_AMBULATORY_CARE_PROVIDER_SITE_OTHER): Payer: Managed Care, Other (non HMO) | Admitting: Family Medicine

## 2016-09-24 DIAGNOSIS — M25511 Pain in right shoulder: Secondary | ICD-10-CM

## 2016-09-24 DIAGNOSIS — M7501 Adhesive capsulitis of right shoulder: Secondary | ICD-10-CM

## 2016-09-24 DIAGNOSIS — M25562 Pain in left knee: Secondary | ICD-10-CM | POA: Insufficient documentation

## 2016-09-24 HISTORY — DX: Pain in left knee: M25.562

## 2016-09-24 HISTORY — DX: Adhesive capsulitis of right shoulder: M75.01

## 2016-09-24 NOTE — Assessment & Plan Note (Signed)
2/2 trapezius strain/spasms.  She did PT remotely - encouraged to restart her home exercises over next few weeks and call us if this does not help resolve the issue.  Heat, tylenol and/or aleve.

## 2016-09-24 NOTE — Assessment & Plan Note (Signed)
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  Shown home exercises and stretches to do daily.  Icing, tylenol or aleve.  Consider PT, injection if not improving.  F/u in 5-6 weeks.

## 2016-09-24 NOTE — Patient Instructions (Signed)
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 Ice over area of pain 3-4 times a day for 15 minutes at a time Straight leg raises, straight leg raises with foot turned outwards and hip side raise exercise 3 sets of 10 once a day - add weights if this becomes too easy. Stretches - pick 2-3 and hold for 20-30 seconds x 3 - do once or twice a day. Tylenol and/or aleve as needed for pain. If not improving, can consider physical therapy and/or steroid injection. Follow up with me in 5-6 weeks.  Your shoulder/neck pain is due to trapezius strain/spasms. Go back to doing the home exercises you learned from physical therapy. If you're not making progress over next 2-4 weeks let me know and I'd consider revisiting formal physical therapy.

## 2016-09-24 NOTE — Progress Notes (Signed)
PCP: Debbrah Alar, NP  Subjective:   HPI: Patient is a 56 y.o. female here for left knee, right shoulder pain.  Patient reports about 1 month ago she started to get pain lateral left knee. Thinks she may have turned her knee a little bit when playing tennis. Pain worse when going down stairs, slopes. Pain improves with flat surfaces. Pain level 2/10 but up to 6/10 and can be sharp. No skin changes, numbness. No catching, locking, giving out. Also with pain right superior, posterior shoulder when she bikes. Worse the longer she goes on bike ride. No radiation into arms though reports occasionally she will get numbness in fingers both sides.  Past Medical History:  Diagnosis Date  . Arthritis   . GERD (gastroesophageal reflux disease)   . History of chicken pox   . Hypertension   . Osteopenia     Current Outpatient Prescriptions on File Prior to Visit  Medication Sig Dispense Refill  . Calcium Carbonate-Vitamin D (CALTRATE 600+D) 600-400 MG-UNIT tablet Take 1 tablet by mouth 2 (two) times daily.    Marland Kitchen ESTRING 2 MG vaginal ring PLACE 1 RING VAGINALLY EVERY 3 MONTHS AS DIRECTED 1 each 3  . hydrochlorothiazide (HYDRODIURIL) 25 MG tablet Take 1 tablet (25 mg total) by mouth daily. 90 tablet 1  . lisinopril (PRINIVIL,ZESTRIL) 5 MG tablet Take 1 tablet (5 mg total) by mouth daily. 90 tablet 1  . Multiple Vitamins-Minerals (CENTRUM SILVER PO) Take 1 tablet by mouth daily.    Marland Kitchen omeprazole (PRILOSEC) 40 MG capsule Take 1 capsule (40 mg total) by mouth daily. 90 capsule 1   Current Facility-Administered Medications on File Prior to Visit  Medication Dose Route Frequency Provider Last Rate Last Dose  . 0.9 %  sodium chloride infusion  500 mL Intravenous Continuous Nandigam, Venia Minks, MD        Past Surgical History:  Procedure Laterality Date  . CESAREAN SECTION  1995  . CESAREAN SECTION  1997  . CHOLECYSTECTOMY  1991  . JOINT REPLACEMENT     R hip  . OVARIAN CYST REMOVAL   1990  . ROOT CANAL    . TOTAL HIP ARTHROPLASTY  2011  . WISDOM TOOTH EXTRACTION      Allergies  Allergen Reactions  . Metoprolol Tartrate Other (See Comments)    Chest pain, increased palpitations    Social History   Social History  . Marital status: Married    Spouse name: N/A  . Number of children: N/A  . Years of education: N/A   Occupational History  . Not on file.   Social History Main Topics  . Smoking status: Former Smoker    Quit date: 05/17/2015  . Smokeless tobacco: Never Used  . Alcohol use 8.4 oz/week    14 Glasses of wine per week     Comment: 2 glasses of wine a night  . Drug use: No  . Sexual activity: Not on file   Other Topics Concern  . 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 Alcohol abuse Father        deceased  . Cancer Father        prostate, basal cell   . Arthritis Father   .  Heart disease Father   . Arthritis Paternal Grandfather   . Heart disease Paternal Grandfather   . Dementia Mother   . Hypertension Mother   . Heart disease Maternal Grandfather   . Colon cancer Neg Hx     BP 120/67   Pulse 74   Ht 5\' 10"  (1.778 m)   Wt 180 lb (81.6 kg)   BMI 25.83 kg/m   Review of Systems: See HPI above.     Objective:  Physical Exam:  Gen: NAD, comfortable in exam room  Left knee: No gross deformity, ecchymoses, effusion. No TTP currently. FROM. Negative ant/post drawers. Negative valgus/varus testing. Negative lachmanns. Negative mcmurrays, apleys, patellar apprehension, sit home, thessalys. NV intact distally.  Right knee: FROM without pain.  Neck: No gross deformity, swelling, bruising. TTP mildly right trapezius.  No midline/bony TTP.  No other tenderness. Only about 25 degrees left lat rotation, 30 right.  Full  flexion, 15 extension. BUE strength 5/5.   Sensation intact to light touch.   NV intact distal BUEs.  Right shoulder: No swelling, ecchymoses.  No gross deformity. No TTP except trapezius noted above. FROM. Negative Hawkins, Neers. Negative Yergasons. Strength 5/5 with empty can and resisted internal/external rotation. Negative apprehension. NV intact distally.  Left shoulder: FROM without pain.   Assessment & Plan:  1. Left knee pain - consistent with distal IT band syndrome based on location and otherwise reassuring exam.  Shown home exercises and stretches to do daily.  Icing, tylenol or aleve.  Consider PT, injection if not improving.  F/u in 5-6 weeks.  2. Right shoulder pain - 2/2 trapezius strain/spasms.  She did PT remotely - encouraged to restart her home exercises over next few weeks and call us if this does not help resolve the issue.  Heat, tylenol and/or aleve.

## 2016-12-24 ENCOUNTER — Ambulatory Visit: Payer: Managed Care, Other (non HMO) | Admitting: Family

## 2017-01-04 ENCOUNTER — Other Ambulatory Visit: Payer: Self-pay | Admitting: Family

## 2017-01-04 ENCOUNTER — Other Ambulatory Visit: Payer: Self-pay | Admitting: Medical

## 2017-01-04 ENCOUNTER — Ambulatory Visit: Payer: Managed Care, Other (non HMO) | Admitting: Family

## 2017-01-04 NOTE — Telephone Encounter (Signed)
Pt due for follow up please call and schedule appointment.  

## 2017-01-07 NOTE — Telephone Encounter (Signed)
lvm advising patient to schedule follow up

## 2017-01-10 ENCOUNTER — Other Ambulatory Visit: Payer: Self-pay | Admitting: Family

## 2017-01-11 ENCOUNTER — Other Ambulatory Visit: Payer: Self-pay | Admitting: Family

## 2017-02-11 ENCOUNTER — Encounter: Payer: Self-pay | Admitting: Family

## 2017-02-11 ENCOUNTER — Ambulatory Visit (INDEPENDENT_AMBULATORY_CARE_PROVIDER_SITE_OTHER): Payer: Managed Care, Other (non HMO) | Admitting: Family

## 2017-02-11 VITALS — BP 134/75 | HR 71 | Temp 97.8°F | Resp 16 | Ht 70.0 in | Wt 185.2 lb

## 2017-02-11 DIAGNOSIS — K219 Gastro-esophageal reflux disease without esophagitis: Secondary | ICD-10-CM | POA: Diagnosis not present

## 2017-02-11 DIAGNOSIS — I10 Essential (primary) hypertension: Secondary | ICD-10-CM

## 2017-02-11 LAB — BASIC METABOLIC PANEL
BUN: 15 mg/dL (ref 6–23)
CALCIUM: 9.5 mg/dL (ref 8.4–10.5)
CO2: 32 meq/L (ref 19–32)
CREATININE: 0.67 mg/dL (ref 0.40–1.20)
Chloride: 98 mEq/L (ref 96–112)
GFR: 95.93 mL/min (ref >60.00)
GLUCOSE: 103 mg/dL — AB (ref 70–99)
Potassium: 3.6 mEq/L (ref 3.5–5.1)
SODIUM: 137 meq/L (ref 135–145)

## 2017-02-11 MED ORDER — ESTRADIOL 2 MG VA RING: VAGINAL_RING | VAGINAL | 3 refills | Status: DC

## 2017-02-11 NOTE — Progress Notes (Signed)
Subjective:    Patient ID: Lauren Walters, female    DOB: 03-04-1960, 56 y.o.   MRN: [id number]  HPI   HTN- maintained on hctz and lisinopril. Denies CP/SOB or swelling. BP Readings from Last 3 Encounters:  02/11/17 134/75  09/24/16 120/67  06/22/16 130/80   GERD- reports that symptoms are well controlled on PPI. Stopped x 3 days and symptoms returned.   Review of Systems    see HPI  Past Medical History:  Diagnosis Date  . Arthritis   . GERD (gastroesophageal reflux disease)   . History of chicken pox   . Hypertension   . Osteopenia      Social History   Socioeconomic History  . Marital status: Married    Spouse name: Not on file  . Number of children: Not on file  . Years of education: Not on file  . Highest education level: Not on file  Social Needs  . Financial resource strain: Not on file  . Food insecurity - worry: Not on file  . Food insecurity - inability: Not on file  . Transportation needs - medical: Not on file  . Transportation needs - non-medical: Not on file  Occupational History  . Not on file  Tobacco Use  . Smoking status: Former Smoker    Last attempt to quit: 05/17/2015    Years since quitting: 1.7  . Smokeless tobacco: Never Used  Substance and Sexual Activity  . Alcohol use: Yes    Alcohol/week: 8.4 oz    Types: 14 Glasses of wine per week    Comment: 2 glasses of wine a night  . Drug use: No  . Sexual activity: Not on file  Other Topics Concern  . 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 CESAREAN SECTION  1995  . CESAREAN SECTION  1997  . CHOLECYSTECTOMY  1991  . JOINT REPLACEMENT     R hip  . OVARIAN CYST REMOVAL  1990  . ROOT CANAL    . TOTAL HIP ARTHROPLASTY  2011  . WISDOM TOOTH EXTRACTION      Family History    Problem Relation Age of Onset  . Alcohol abuse Father        deceased  . Cancer Father        prostate, basal cell   . Arthritis Father   . Heart disease Father   . Arthritis Paternal Grandfather   . Heart disease Paternal Grandfather   . Dementia Mother   . Hypertension Mother   . Heart disease Maternal Grandfather   . Colon cancer Neg Hx     Allergies  Allergen Reactions  . Metoprolol Tartrate Other (See Comments)    Chest pain, increased palpitations    Current Outpatient Medications on File Prior to Visit  Medication Sig Dispense Refill  . doxycycline (VIBRAMYCIN) 50 MG capsule Take 1 to twice a day as needed for blepharitis    . ESTRING 2 MG vaginal ring PLACE 1 RING VAGINALLY EVERY 3 MONTHS AS DIRECTED 1 each 3  . hydrochlorothiazide (HYDRODIURIL) 25 MG tablet TAKE 1 TABLET BY MOUTH EVERY DAY 90 tablet 0  . lisinopril (PRINIVIL,ZESTRIL) 5 MG tablet TAKE 1 TABLET BY MOUTH EVERY DAY 90 tablet 1  .  omeprazole (PRILOSEC) 40 MG capsule Take 1 capsule (40 mg total) by mouth daily. 90 capsule 1   Current Facility-Administered Medications on File Prior to Visit  Medication Dose Route Frequency Provider Last Rate Last Dose  . 0.9 %  sodium chloride infusion  500 mL Intravenous Continuous Nandigam, Kavitha V, MD        BP 134/75 (BP Location: Right Arm, Cuff Size: Normal)   Pulse 71   Temp 97.8 F (36.6 C) (Oral)   Resp 16   Ht 5\' 10"  (1.778 m)   Wt 185 lb 3.2 oz (84 kg)   SpO2 100%   BMI 26.57 kg/m    Objective:   Physical Exam  Constitutional: She is oriented to person, place, and time. She appears well-developed and well-nourished.  HENT:  Head: Normocephalic and atraumatic.  Cardiovascular: Normal rate, regular rhythm and normal heart sounds.  No murmur heard. Pulmonary/Chest: Effort normal and breath sounds normal. No respiratory distress. She has no wheezes.  Musculoskeletal: She exhibits no edema.  Neurological: She is alert and oriented to person, place,  and time.  Psychiatric: She has a normal mood and affect. Her behavior is normal. Judgment and thought content normal.          Assessment & Plan:  HTN- BP is stable. Continue current meds, obtain bmet.  GERD- stable on PPI, did not tolerate discontinuing, continue same.

## 2017-02-11 NOTE — Patient Instructions (Addendum)
Please complete lab work prior to leaving. Follow up as scheduled.  

## 2017-04-11 ENCOUNTER — Other Ambulatory Visit: Payer: Self-pay | Admitting: Family

## 2017-05-15 ENCOUNTER — Encounter: Payer: Managed Care, Other (non HMO) | Admitting: Family

## 2017-05-21 ENCOUNTER — Encounter: Payer: Self-pay | Admitting: Family

## 2017-05-21 ENCOUNTER — Ambulatory Visit (INDEPENDENT_AMBULATORY_CARE_PROVIDER_SITE_OTHER): Payer: Managed Care, Other (non HMO) | Admitting: Family

## 2017-05-21 VITALS — BP 138/85 | HR 61 | Temp 98.0°F | Resp 16 | Ht 70.0 in | Wt 186.0 lb

## 2017-05-21 DIAGNOSIS — Z0000 Encounter for general adult medical examination without abnormal findings: Secondary | ICD-10-CM

## 2017-05-21 LAB — CBC WITH DIFFERENTIAL/PLATELET
Basophils Absolute: 0.1 10*3/uL (ref 0.0–0.1)
Basophils Relative: 1 % (ref 0.0–3.0)
Eosinophils Absolute: 0.2 10*3/uL (ref 0.0–0.7)
Eosinophils Relative: 2.9 % (ref 0.0–5.0)
HEMATOCRIT: 40.1 % (ref 36.0–46.0)
Hemoglobin: 13.5 g/dL (ref 12.0–15.0)
LYMPHS ABS: 1.7 10*3/uL (ref 0.7–4.0)
LYMPHS PCT: 27.5 % (ref 12.0–46.0)
MCHC: 33.6 g/dL (ref 30.0–36.0)
MCV: 92.2 fl (ref 78.0–100.0)
MONOS PCT: 7.5 % (ref 3.0–12.0)
Monocytes Absolute: 0.5 10*3/uL (ref 0.1–1.0)
NEUTROS PCT: 61.1 % (ref 43.0–77.0)
Neutro Abs: 3.8 10*3/uL (ref 1.4–7.7)
Platelets: 273 10*3/uL (ref 150.0–400.0)
RBC: 4.35 Mil/uL (ref 3.87–5.11)
RDW: 13.5 % (ref 11.5–15.5)
WBC: 6.2 10*3/uL (ref 4.0–10.5)

## 2017-05-21 LAB — URINALYSIS, ROUTINE W REFLEX MICROSCOPIC
BILIRUBIN URINE: NEGATIVE
HGB URINE DIPSTICK: NEGATIVE
KETONES UR: NEGATIVE
LEUKOCYTES UA: NEGATIVE
NITRITE: NEGATIVE
Specific Gravity, Urine: 1.01 (ref 1.000–1.030)
Total Protein, Urine: NEGATIVE
UROBILINOGEN UA: 0.2 (ref 0.0–1.0)
Urine Glucose: NEGATIVE
WBC, UA: NONE SEEN — AB (ref 0–?)
pH: 6.5 (ref 5.0–8.0)

## 2017-05-21 LAB — HEPATIC FUNCTION PANEL
ALBUMIN: 4.5 g/dL (ref 3.5–5.2)
ALK PHOS: 39 U/L (ref 39–117)
ALT: 19 U/L (ref 0–35)
AST: 26 U/L (ref 0–37)
Bilirubin, Direct: 0.1 mg/dL (ref 0.0–0.3)
TOTAL PROTEIN: 7.2 g/dL (ref 6.0–8.3)
Total Bilirubin: 0.6 mg/dL (ref 0.2–1.2)

## 2017-05-21 LAB — BASIC METABOLIC PANEL
BUN: 12 mg/dL (ref 6–23)
CO2: 32 meq/L (ref 19–32)
Calcium: 9.9 mg/dL (ref 8.4–10.5)
Chloride: 99 mEq/L (ref 96–112)
Creatinine, Ser: 0.59 mg/dL (ref 0.40–1.20)
GFR: 110.99 mL/min (ref >60.00)
GLUCOSE: 101 mg/dL — AB (ref 70–99)
Potassium: 3.6 mEq/L (ref 3.5–5.1)
Sodium: 139 mEq/L (ref 135–145)

## 2017-05-21 LAB — LIPID PANEL
CHOLESTEROL: 214 mg/dL — AB (ref 0–200)
HDL: 94.5 mg/dL (ref >39.00)
LDL Cholesterol: 105 mg/dL — ABNORMAL HIGH (ref 0–99)
NONHDL: 119.29
Total CHOL/HDL Ratio: 2
Triglycerides: 72 mg/dL (ref 0.0–149.0)
VLDL: 14.4 mg/dL (ref 0.0–40.0)

## 2017-05-21 LAB — TSH: TSH: 1.37 u[IU]/mL (ref 0.35–4.50)

## 2017-05-21 MED ORDER — OMEPRAZOLE 40 MG PO CPDR: 40.0000 mg | DELAYED_RELEASE_CAPSULE | Freq: Every day | ORAL | 1 refills | Status: DC

## 2017-05-21 MED ORDER — HYDROCHLOROTHIAZIDE 25 MG PO TABS: 25.0000 mg | ORAL_TABLET | Freq: Every day | ORAL | 1 refills | Status: DC

## 2017-05-21 MED ORDER — LISINOPRIL 5 MG PO TABS: 5.0000 mg | ORAL_TABLET | Freq: Every day | ORAL | 1 refills | Status: DC

## 2017-05-21 NOTE — Patient Instructions (Addendum)
Please complete lab work prior to leaving. Schedule mammogram on the first floor.  

## 2017-05-21 NOTE — Progress Notes (Signed)
Subjective:    Patient ID: Lauren Walters, female    DOB: [birth year], 57 y.o.   MRN: [id number]  HPI  Patient presents today for complete physical.  Immunizations: tdap 2011 Diet: improving her diet Exercise: yes Wt Readings from Last 3 Encounters:  05/21/17 186 lb (84.4 kg)  02/11/17 185 lb 3.2 oz (84 kg)  09/24/16 180 lb (81.6 kg)    Colonoscopy: 12/17- due 2023 Dexa: 1/17 Pap Smear: 3/18 Mammogram: 1/17    Review of Systems  Constitutional: Negative for unexpected weight change.  HENT: Negative for hearing loss and rhinorrhea.   Eyes: Negative for visual disturbance.  Respiratory: Negative for cough.   Cardiovascular: Negative for leg swelling.  Gastrointestinal: Negative for constipation and diarrhea.  Genitourinary: Negative for dysuria and frequency.  Musculoskeletal: Negative for arthralgias and myalgias.  Skin: Negative for rash.  Neurological: Negative for headaches.  Hematological: Negative for adenopathy.  Psychiatric/Behavioral:       Denies depression/anxiety   Past Medical History:  Diagnosis Date  . Arthritis   . GERD (gastroesophageal reflux disease)   . History of chicken pox   . Hypertension   . Osteopenia      Social History   Socioeconomic History  . Marital status: Married    Spouse name: Not on file  . Number of children: Not on file  . Years of education: Not on file  . Highest education level: Not on file  Occupational History  . Not on file  Social Needs  . Financial resource strain: Not on file  . Food insecurity:    Worry: Not on file    Inability: Not on file  . Transportation needs:    Medical: Not on file    Non-medical: Not on file  Tobacco Use  . Smoking status: Former Smoker    Last attempt to quit: 05/17/2015    Years since quitting: 2.0  . Smokeless tobacco: Never Used  Substance and Sexual Activity  . Alcohol use: Yes    Alcohol/week: 8.4 oz    Types: 14 Glasses of wine per week    Comment: 2 glasses  of wine a night  . Drug use: No  . Sexual activity: Not on file  Lifestyle  . Physical activity:    Days per week: Not on file    Minutes per session: Not on file  . Stress: Not on file  Relationships  . Social connections:    Talks on phone: Not on file    Gets together: Not on file    Attends religious service: Not on file    Active member of club or organization: Not on file    Attends meetings of clubs or organizations: Not on file    Relationship status: Not on file  . Intimate partner violence:    Fear of current or ex partner: Not on file    Emotionally abused: Not on file    Physically abused: Not on file    Forced sexual activity: Not on file  Other Topics Concern  . 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 CESAREAN SECTION  1995  . CESAREAN SECTION  1997  . CHOLECYSTECTOMY  1991  . JOINT REPLACEMENT     R  hip  . OVARIAN CYST REMOVAL  1990  . ROOT CANAL    . TOTAL HIP ARTHROPLASTY  2011  . WISDOM TOOTH EXTRACTION      Family History  Problem Relation Age of Onset  . Alcohol abuse Father        deceased  . Cancer Father        prostate, basal cell   . Arthritis Father   . Heart disease Father   . Arthritis Paternal Grandfather   . Heart disease Paternal Grandfather   . Dementia Mother   . Hypertension Mother   . Heart disease Maternal Grandfather   . Colon cancer Neg Hx     Allergies  Allergen Reactions  . Metoprolol Tartrate Other (See Comments)    Chest pain, increased palpitations    Current Outpatient Medications on File Prior to Visit  Medication Sig Dispense Refill  . amoxicillin (AMOXIL) 500 MG capsule TAKE 4 CAPSULES BY MOUTH 1 HOUR PRIOR TO PROCEDURE  5  . doxycycline (VIBRAMYCIN) 50 MG capsule Take 1 to twice a day as needed for  blepharitis    . estradiol (ESTRING) 2 MG vaginal ring PLACE 1 RING VAGINALLY EVERY 3 MONTHS AS DIRECTED 1 each 3   Current Facility-Administered Medications on File Prior to Visit  Medication Dose Route Frequency Provider Last Rate Last Dose  . 0.9 %  sodium chloride infusion  500 mL Intravenous Continuous Nandigam, Kavitha V, MD        BP (!) 145/72 (BP Location: Right Arm, Patient Position: Sitting, Cuff Size: Small)   Pulse 61   Temp 98 F (36.7 C) (Oral)   Resp 16   Ht 5\' 10"  (1.778 m)   Wt 186 lb (84.4 kg)   SpO2 100%   BMI 26.69 kg/m       Objective:   Physical Exam  Physical Exam  Constitutional: She is oriented to person, place, and time. She appears well-developed and well-nourished. No distress.  HENT:  Head: Normocephalic and atraumatic.  Right Ear: Tympanic membrane and ear canal normal.  Left Ear: Tympanic membrane and ear canal normal.  Mouth/Throat: Oropharynx is clear and moist.  Eyes: Pupils are equal, round, and reactive to light. No scleral icterus.  Neck: Normal range of motion. No thyromegaly present.  Cardiovascular: Normal rate and regular rhythm.   No murmur heard. Pulmonary/Chest: Effort normal and breath sounds normal. No respiratory distress. He has no wheezes. She has no rales. She exhibits no tenderness.  Abdominal: Soft. Bowel sounds are normal. She exhibits no distension and no mass. There is no tenderness. There is no rebound and no guarding.  Musculoskeletal: She exhibits no edema.  Lymphadenopathy:    She has no cervical adenopathy.  Neurological: She is alert and oriented to person, place, and time. She has normal patellar reflexes. She exhibits normal muscle tone. Coordination normal.  Skin: Skin is warm and dry.  Psychiatric: She has a normal mood and affect. Her behavior is normal. Judgment and thought content normal.  Breasts: Examined lying Right: Without masses, retractions, discharge or axillary adenopathy.  Left: Without masses,  retractions, discharge or axillary adenopathy.          Assessment & Plan:    Preventative care- discussed healthy diet, exercise, weight loss.  She is a candidate for shingrix which we do not have in stock but I have advised her to see if she can obtain from her local pharmacy. Refer for mammogram, colo up to date. Pap up to  date. Obtain routine lab work.  EKG tracing is personally reviewed.  EKG notes NSR.  No acute changes.      Assessment & Plan:

## 2017-07-23 ENCOUNTER — Ambulatory Visit (HOSPITAL_BASED_OUTPATIENT_CLINIC_OR_DEPARTMENT_OTHER)
Admission: RE | Admit: 2017-07-23 | Discharge: 2017-07-23 | Disposition: A | Discharge status: Home / Self Care | Payer: Managed Care, Other (non HMO) | Source: Ambulatory Visit | Attending: Family | Admitting: Family

## 2017-07-23 DIAGNOSIS — Z0000 Encounter for general adult medical examination without abnormal findings: Secondary | ICD-10-CM

## 2017-08-02 ENCOUNTER — Encounter: Payer: Self-pay | Admitting: Family

## 2017-08-02 ENCOUNTER — Ambulatory Visit (INDEPENDENT_AMBULATORY_CARE_PROVIDER_SITE_OTHER): Payer: Managed Care, Other (non HMO) | Admitting: Family

## 2017-08-02 VITALS — BP 124/64 | HR 65 | Temp 98.3°F | Resp 16 | Ht 70.0 in | Wt 186.2 lb

## 2017-08-02 DIAGNOSIS — H6982 Other specified disorders of Eustachian tube, left ear: Secondary | ICD-10-CM

## 2017-08-02 DIAGNOSIS — H9312 Tinnitus, left ear: Secondary | ICD-10-CM

## 2017-08-02 MED ORDER — FLUTICASONE PROPIONATE 50 MCG/ACT NA SUSP: 2.0000 | Freq: Every day | NASAL | 6 refills | Status: DC

## 2017-08-02 MED ORDER — CETIRIZINE HCL 10 MG PO TABS: 10.0000 mg | ORAL_TABLET | Freq: Every day | ORAL | 11 refills | Status: AC

## 2017-08-02 NOTE — Patient Instructions (Signed)
Please add flonase 2 sprays each nostril daily. Continue zyrtec once daily.   Call if symptoms worsen or if not improved in a few weeks.

## 2017-08-02 NOTE — Progress Notes (Signed)
Subjective:    Patient ID: Lauren Walters, female    DOB: 19-Feb-1961, 57 y.o.   MRN: [id number]  HPI   Pt is a 57 yr old female who presents today with chief complaint of ringing in her right ear for 2 weeks. Reports that her ear has felt "plugged x 3 days."  Tried otc earwax kit without improvement in her symptoms.  She is taking zyrtec.     Review of Systems See HPI  Past Medical History:  Diagnosis Date  . Arthritis   . GERD (gastroesophageal reflux disease)   . History of chicken pox   . Hypertension   . Osteopenia      Social History   Socioeconomic History  . Marital status: Married    Spouse name: Not on file  . Number of children: Not on file  . Years of education: Not on file  . Highest education level: Not on file  Occupational History  . Not on file  Social Needs  . Financial resource strain: Not on file  . Food insecurity:    Worry: Not on file    Inability: Not on file  . Transportation needs:    Medical: Not on file    Non-medical: Not on file  Tobacco Use  . Smoking status: Former Smoker    Last attempt to quit: 05/17/2015    Years since quitting: 2.2  . Smokeless tobacco: Never Used  Substance and Sexual Activity  . Alcohol use: Yes    Alcohol/week: 8.4 oz    Types: 14 Glasses of wine per week    Comment: 2 glasses of wine a night  . Drug use: No  . Sexual activity: Not on file  Lifestyle  . Physical activity:    Days per week: Not on file    Minutes per session: Not on file  . Stress: Not on file  Relationships  . Social connections:    Talks on phone: Not on file    Gets together: Not on file    Attends religious service: Not on file    Active member of club or organization: Not on file    Attends meetings of clubs or organizations: Not on file    Relationship status: Not on file  . Intimate partner violence:    Fear of current or ex partner: Not on file    Emotionally abused: Not on file    Physically abused: Not on file   Forced sexual activity: Not on file  Other Topics Concern  . 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 CESAREAN SECTION  1995  . CESAREAN SECTION  1997  . CHOLECYSTECTOMY  1991  . JOINT REPLACEMENT     R hip  . OVARIAN CYST REMOVAL  1990  . ROOT CANAL    . TOTAL HIP ARTHROPLASTY  2011  . WISDOM TOOTH EXTRACTION      Family History  Problem Relation Age of Onset  . Alcohol abuse Father        deceased  . Cancer Father        prostate, basal cell   . Arthritis Father   . Heart disease Father   . Arthritis Paternal Grandfather   . Heart disease  Paternal Grandfather   . Dementia Mother   . Hypertension Mother   . Heart disease Maternal Grandfather   . Colon cancer Neg Hx     Allergies  Allergen Reactions  . Metoprolol Tartrate Other (See Comments)    Chest pain, increased palpitations    Current Outpatient Medications on File Prior to Visit  Medication Sig Dispense Refill  . estradiol (ESTRING) 2 MG vaginal ring PLACE 1 RING VAGINALLY EVERY 3 MONTHS AS DIRECTED 1 each 3  . hydrochlorothiazide (HYDRODIURIL) 25 MG tablet Take 1 tablet (25 mg total) by mouth daily. 90 tablet 1  . lisinopril (PRINIVIL,ZESTRIL) 5 MG tablet Take 1 tablet (5 mg total) by mouth daily. 90 tablet 1  . omeprazole (PRILOSEC) 40 MG capsule Take 1 capsule (40 mg total) by mouth daily. 90 capsule 1   Current Facility-Administered Medications on File Prior to Visit  Medication Dose Route Frequency Provider Last Rate Last Dose  . 0.9 %  sodium chloride infusion  500 mL Intravenous Continuous Nandigam, Kavitha V, MD        BP 124/64 (BP Location: Right Arm, Cuff Size: Normal)   Pulse 65   Temp 98.3 F (36.8 C) (Oral)   Resp 16   Ht 5' 10"  (1.778 m)   Wt 186 lb 3.2 oz (84.5 kg)   SpO2 100%    BMI 26.72 kg/m       Objective:   Physical Exam  Constitutional: She is oriented to person, place, and time. She appears well-developed and well-nourished.  HENT:  Head: Normocephalic and atraumatic.  Right Ear: Tympanic membrane and ear canal normal.  Left Ear: Tympanic membrane and ear canal normal.  Cardiovascular:  No murmur heard. Pulmonary/Chest: No respiratory distress. She has no wheezes.  Neurological: She is alert and oriented to person, place, and time.  Psychiatric: She has a normal mood and affect. Her behavior is normal. Judgment and thought content normal.          Assessment & Plan:  Eustachian tube dysfunction/Tinnitis- rx with zyrtec, flonase, call if symptoms worsen or if symptoms are not improved in the next few weeks.

## 2017-08-27 ENCOUNTER — Encounter: Payer: Self-pay | Admitting: Family

## 2017-08-27 DIAGNOSIS — H9319 Tinnitus, unspecified ear: Secondary | ICD-10-CM

## 2017-08-28 NOTE — Telephone Encounter (Signed)
Gwen,  I placed order for ENT at [facility] in [place]- she is requesting ENT at [facility] instead please. Could you please arrange?

## 2017-08-28 NOTE — Telephone Encounter (Signed)
Referral has been changed.

## 2017-08-28 NOTE — Telephone Encounter (Signed)
Sent referral to [facility]NT

## 2017-09-05 NOTE — Telephone Encounter (Signed)
Gwen -- can you check on status of below referral?

## 2017-09-17 ENCOUNTER — Encounter: Payer: Self-pay | Admitting: Family

## 2017-09-17 NOTE — Telephone Encounter (Signed)
Gwen can you please help patient with referral to ENT    Please advise

## 2017-09-27 DIAGNOSIS — H9312 Tinnitus, left ear: Secondary | ICD-10-CM

## 2017-09-27 HISTORY — DX: Tinnitus, left ear: H93.12

## 2017-11-20 ENCOUNTER — Encounter: Payer: Self-pay | Admitting: Family

## 2017-11-20 ENCOUNTER — Ambulatory Visit (INDEPENDENT_AMBULATORY_CARE_PROVIDER_SITE_OTHER): Payer: Managed Care, Other (non HMO) | Admitting: Family

## 2017-11-20 VITALS — BP 131/61 | HR 55 | Temp 98.1°F | Resp 16 | Ht 70.0 in | Wt 185.0 lb

## 2017-11-20 DIAGNOSIS — K219 Gastro-esophageal reflux disease without esophagitis: Secondary | ICD-10-CM | POA: Diagnosis not present

## 2017-11-20 DIAGNOSIS — I10 Essential (primary) hypertension: Secondary | ICD-10-CM

## 2017-11-20 LAB — BASIC METABOLIC PANEL
BUN: 15 mg/dL (ref 6–23)
CALCIUM: 10 mg/dL (ref 8.4–10.5)
CO2: 30 mEq/L (ref 19–32)
CREATININE: 0.63 mg/dL (ref 0.40–1.20)
Chloride: 100 mEq/L (ref 96–112)
GFR: 102.72 mL/min (ref >60.00)
Glucose, Bld: 100 mg/dL — ABNORMAL HIGH (ref 70–99)
Potassium: 3.7 mEq/L (ref 3.5–5.1)
Sodium: 140 mEq/L (ref 135–145)

## 2017-11-20 NOTE — Patient Instructions (Signed)
Please complete lab work prior to leaving.   

## 2017-11-20 NOTE — Progress Notes (Signed)
Subjective:    Patient ID: Lauren Walters, female    DOB: [birth year], 57 y.o.   MRN: [id number]  HPI  Patient presents today for follow up.  HTN- denies CP/SOB/swelling.  BP Readings from Last 3 Encounters:  11/20/17 131/61  08/02/17 124/64  05/21/17 138/85   GERD- maintained on omeprazole. Denies gerd.  Tried once to stop the medication and symptoms returned.       Review of Systems    see HPI  Past Medical History:  Diagnosis Date  . Arthritis   . GERD (gastroesophageal reflux disease)   . History of chicken pox   . Hypertension   . Osteopenia      Social History   Socioeconomic History  . Marital status: Married    Spouse name: Not on file  . Number of children: Not on file  . Years of education: Not on file  . Highest education level: Not on file  Occupational History  . Not on file  Social Needs  . Financial resource strain: Not on file  . Food insecurity:    Worry: Not on file    Inability: Not on file  . Transportation needs:    Medical: Not on file    Non-medical: Not on file  Tobacco Use  . Smoking status: Former Smoker    Last attempt to quit: 05/17/2015    Years since quitting: 2.5  . Smokeless tobacco: Never Used  Substance and Sexual Activity  . Alcohol use: Yes    Alcohol/week: 14.0 standard drinks    Types: 14 Glasses of wine per week    Comment: 2 glasses of wine a night  . Drug use: No  . Sexual activity: Not on file  Lifestyle  . Physical activity:    Days per week: Not on file    Minutes per session: Not on file  . Stress: Not on file  Relationships  . Social connections:    Talks on phone: Not on file    Gets together: Not on file    Attends religious service: Not on file    Active member of club or organization: Not on file    Attends meetings of clubs or organizations: Not on file    Relationship status: Not on file  . Intimate partner violence:    Fear of current or ex partner: Not on file    Emotionally abused:  Not on file    Physically abused: Not on file    Forced sexual activity: Not on file  Other Topics Concern  . 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 CESAREAN SECTION  1995  . CESAREAN SECTION  1997  . CHOLECYSTECTOMY  1991  . JOINT REPLACEMENT     R hip  . OVARIAN CYST REMOVAL  1990  . ROOT CANAL    . TOTAL HIP ARTHROPLASTY  2011  . WISDOM TOOTH EXTRACTION      Family History  Problem Relation Age of Onset  . Alcohol abuse Father        deceased  . Cancer Father        prostate, basal cell   . Arthritis Father   . Heart disease Father   . Arthritis Paternal Grandfather   .  Heart disease Paternal Grandfather   . Dementia Mother   . Hypertension Mother   . Heart disease Maternal Grandfather   . Colon cancer Neg Hx     Allergies  Allergen Reactions  . Metoprolol Tartrate Other (See Comments)    Chest pain, increased palpitations    Current Outpatient Medications on File Prior to Visit  Medication Sig Dispense Refill  . cetirizine (ZYRTEC) 10 MG tablet Take 1 tablet (10 mg total) by mouth daily. 30 tablet 11  . estradiol (ESTRING) 2 MG vaginal ring PLACE 1 RING VAGINALLY EVERY 3 MONTHS AS DIRECTED 1 each 3  . fluticasone (FLONASE) 50 MCG/ACT nasal spray Place 2 sprays into both nostrils daily. 16 g 6  . hydrochlorothiazide (HYDRODIURIL) 25 MG tablet Take 1 tablet (25 mg total) by mouth daily. 90 tablet 1  . lisinopril (PRINIVIL,ZESTRIL) 5 MG tablet Take 1 tablet (5 mg total) by mouth daily. 90 tablet 1  . omeprazole (PRILOSEC) 40 MG capsule Take 1 capsule (40 mg total) by mouth daily. 90 capsule 1   Current Facility-Administered Medications on File Prior to Visit  Medication Dose Route Frequency Provider Last Rate Last Dose  . 0.9 %  sodium chloride infusion   500 mL Intravenous Continuous Nandigam, Kavitha V, MD        BP 131/61 (BP Location: Right Arm, Patient Position: Sitting, Cuff Size: Small)   Pulse (!) 55   Temp 98.1 F (36.7 C) (Oral)   Resp 16   Ht 5\' 10"  (1.778 m)   Wt 185 lb (83.9 kg)   SpO2 100%   BMI 26.54 kg/m    Objective:   Physical Exam  Constitutional: She is oriented to person, place, and time. She appears well-developed and well-nourished.  Cardiovascular: Normal rate, regular rhythm and normal heart sounds.  No murmur heard. Pulmonary/Chest: Effort normal and breath sounds normal. No respiratory distress. She has no wheezes.  Neurological: She is alert and oriented to person, place, and time.  Skin: Skin is warm and dry.  Psychiatric: She has a normal mood and affect. Her behavior is normal. Judgment and thought content normal.          Assessment & Plan:  HTN- bp stable, continue current meds.  Obtain follow up bmet.   Gerd- stable on omeprazole, continue same.

## 2017-11-21 ENCOUNTER — Encounter: Payer: Self-pay | Admitting: Family

## 2017-11-24 ENCOUNTER — Other Ambulatory Visit: Payer: Self-pay | Admitting: Family

## 2017-12-12 ENCOUNTER — Encounter: Payer: Self-pay | Admitting: Family

## 2017-12-13 MED ORDER — LISINOPRIL 5 MG PO TABS: 5.0000 mg | ORAL_TABLET | Freq: Every day | ORAL | 1 refills | Status: DC

## 2017-12-13 MED ORDER — OMEPRAZOLE 40 MG PO CPDR: 40.0000 mg | DELAYED_RELEASE_CAPSULE | Freq: Every day | ORAL | 3 refills | Status: DC

## 2017-12-13 MED ORDER — HYDROCHLOROTHIAZIDE 25 MG PO TABS: 25.0000 mg | ORAL_TABLET | Freq: Every day | ORAL | 1 refills | Status: DC

## 2017-12-13 NOTE — Addendum Note (Signed)
Addended byDamita Dunnings D on: 12/13/2017 10:34 AM   Modules accepted: Orders

## 2018-02-14 ENCOUNTER — Other Ambulatory Visit: Payer: Self-pay | Admitting: Family

## 2018-02-17 ENCOUNTER — Ambulatory Visit (INDEPENDENT_AMBULATORY_CARE_PROVIDER_SITE_OTHER): Payer: Managed Care, Other (non HMO) | Admitting: Family Medicine

## 2018-02-17 ENCOUNTER — Encounter: Payer: Self-pay | Admitting: Family Medicine

## 2018-02-17 VITALS — BP 120/68 | HR 83 | Temp 97.5°F | Ht 70.0 in | Wt 186.5 lb

## 2018-02-17 DIAGNOSIS — L720 Epidermal cyst: Secondary | ICD-10-CM | POA: Insufficient documentation

## 2018-02-17 HISTORY — DX: Epidermal cyst: L72.0

## 2018-02-17 MED ORDER — DOXYCYCLINE HYCLATE 100 MG PO TABS: 100.0000 mg | ORAL_TABLET | Freq: Two times a day (BID) | ORAL | 0 refills | Status: DC

## 2018-02-17 NOTE — Patient Instructions (Signed)
Epidermal Cyst    An epidermal cyst is a sac made of skin tissue. The sac contains a substance called keratin. Keratin is a protein that is normally secreted through the hair follicles. When keratin becomes trapped in the top layer of skin (epidermis), it can form an epidermal cyst.  Epidermal cysts can be found anywhere on your body. These cysts are usually harmless (benign), and they may not cause symptoms unless they become infected.  What are the causes?  This condition may be caused by:   A blocked hair follicle.   A hair that curls and re-enters the skin instead of growing straight out of the skin (ingrown hair).   A blocked pore.   Irritated skin.   An injury to the skin.   Certain conditions that are passed along from parent to child (inherited).   Human papillomavirus (HPV).   Long-term (chronic) sun damage to the skin.  What increases the risk?  The following factors may make you more likely to develop an epidermal cyst:   Having acne.   Being overweight.   Being 30-40 years old.  What are the signs or symptoms?  The only symptom of this condition may be a small, painless lump underneath the skin. When an epidermal cyst ruptures, it may become infected. Symptoms may include:   Redness.   Inflammation.   Tenderness.   Warmth.   Fever.   Keratin draining from the cyst. Keratin is grayish-white, bad-smelling substance.   Pus draining from the cyst.  How is this diagnosed?  This condition is diagnosed with a physical exam.   In some cases, you may have a sample of tissue (biopsy) taken from your cyst to be examined under a microscope or tested for bacteria.   You may be referred to a health care provider who specializes in skin care (dermatologist).  How is this treated?  In many cases, epidermal cysts go away on their own without treatment. If a cyst becomes infected, treatment may include:   Opening and draining the cyst, done by a health care provider. After draining, minor surgery to  remove the rest of the cyst may be done.   Antibiotic medicine.   Injections of medicines (steroids) that help to reduce inflammation.   Surgery to remove the cyst. Surgery may be done if the cyst:  ? Becomes large.  ? Bothers you.  ? Has a chance of turning into cancer.   Do not try to open a cyst yourself.  Follow these instructions at home:   Take over-the-counter and prescription medicines only as told by your health care provider.   If you were prescribed an antibiotic medicine, take it it as told by your health care provider. Do not stop using the antibiotic even if you start to feel better.   Keep the area around your cyst clean and dry.   Wear loose, dry clothing.   Avoid touching your cyst.   Check your cyst every day for signs of infection. Check for:  ? Redness, swelling, or pain.  ? Fluid or blood.  ? Warmth.  ? Pus or a bad smell.   Keep all follow-up visits as told by your health care provider. This is important.  How is this prevented?   Wear clean, dry, clothing.   Avoid wearing tight clothing.   Keep your skin clean and dry. Take showers or baths every day.  Contact a health care provider if:   Your cyst develops symptoms of infection.     Your condition is not improving or is getting worse.   You develop a cyst that looks different from other cysts you have had.   You have a fever.  Get help right away if:   Redness spreads from the cyst into the surrounding area.  Summary   An epidermal cyst is a sac made of skin tissue. These cysts are usually harmless (benign), and they may not cause symptoms unless they become infected.   If a cyst becomes infected, treatment may include surgery to open and drain the cyst, or to remove it. Treatment may also include medicines by mouth or through an injection.   Take over-the-counter and prescription medicines only as told by your health care provider. If you were prescribed an antibiotic medicine, take it as told by your health care  provider. Do not stop using the antibiotic even if you start to feel better.   Contact a health care provider if your condition is not improving or is getting worse.   Keep all follow-up visits as told by your health care provider. This is important.  This information is not intended to replace advice given to you by your health care provider. Make sure you discuss any questions you have with your health care provider.  Document Released: 01/14/2004 Document Revised: 08/26/2017 Document Reviewed: 08/26/2017  Elsevier Interactive Patient Education  2019 [employer]

## 2018-02-17 NOTE — Progress Notes (Signed)
Established Patient Office Visit  Subjective:  Patient ID: Lauren Walters, female    DOB: 1960/07/03  Age: 57 y.o. MRN: [id number]  CC:  Chief Complaint  Patient presents with  . spot on right shoulder    HPI Lauren Walters presents for evaluation of red painful bump that showed up on her right shoulder on Saturday morning.  2-day history of a tender swelling on her right shoulder.  There was no injury.  No known insect bite.  No fevers chills.  No drainage from the wound.  She has not tried anything to treat this.  She has no history of MRSA.  She has taken once Doxy in the past for rosacea.  Past Medical History:  Diagnosis Date  . Arthritis   . GERD (gastroesophageal reflux disease)   . History of chicken pox   . Hypertension   . Osteopenia     Past Surgical History:  Procedure Laterality Date  . CESAREAN SECTION  1995  . CESAREAN SECTION  1997  . CHOLECYSTECTOMY  1991  . JOINT REPLACEMENT     R hip  . OVARIAN CYST REMOVAL  1990  . ROOT CANAL    . TOTAL HIP ARTHROPLASTY  2011  . WISDOM TOOTH EXTRACTION      Family History  Problem Relation Age of Onset  . Alcohol abuse Father        deceased  . Cancer Father        prostate, basal cell   . Arthritis Father   . Heart disease Father   . Arthritis Paternal Grandfather   . Heart disease Paternal Grandfather   . Dementia Mother   . Hypertension Mother   . Heart disease Maternal Grandfather   . Colon cancer Neg Hx     Social History   Socioeconomic History  . Marital status: Married    Spouse name: Not on file  . Number of children: Not on file  . Years of education: Not on file  . Highest education level: Not on file  Occupational History  . Not on file  Social Needs  . Financial resource strain: Not on file  . Food insecurity:    Worry: Not on file    Inability: Not on file  . Transportation needs:    Medical: Not on file    Non-medical: Not on file  Tobacco Use  . Smoking status: Former  Smoker    Last attempt to quit: 05/17/2015    Years since quitting: 2.7  . Smokeless tobacco: Never Used  Substance and Sexual Activity  . Alcohol use: Yes    Alcohol/week: 14.0 standard drinks    Types: 14 Glasses of wine per week    Comment: 2 glasses of wine a night  . Drug use: No  . Sexual activity: Not on file  Lifestyle  . Physical activity:    Days per week: Not on file    Minutes per session: Not on file  . Stress: Not on file  Relationships  . Social connections:    Talks on phone: Not on file    Gets together: Not on file    Attends religious service: Not on file    Active member of club or organization: Not on file    Attends meetings of clubs or organizations: Not on file    Relationship status: Not on file  . Intimate partner violence:    Fear of current or ex partner: Not on file  Emotionally abused: Not on file    Physically abused: Not on file    Forced sexual activity: Not on file  Other Topics Concern  . 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 cetirizine (ZYRTEC) 10 MG tablet Take 1 tablet (10 mg total) by mouth daily. 30 tablet 11  . estradiol (ESTRING) 2 MG vaginal ring PLACE 1 RING VAGINALLY EVERY 3 MONTHS AS DIRECTED 1 each 3  . fluticasone (FLONASE) 50 MCG/ACT nasal spray Place 2 sprays into both nostrils daily. 16 g 6  . hydrochlorothiazide (HYDRODIURIL) 25 MG tablet Take 1 tablet (25 mg total) by mouth daily. 90 tablet 1  . lisinopril (PRINIVIL,ZESTRIL) 5 MG tablet Take 1 tablet (5 mg total) by mouth daily. 90 tablet 1  . omeprazole (PRILOSEC) 40 MG capsule Take 1 capsule (40 mg total) by mouth daily. 90 capsule 3  . 0.9 %  sodium chloride infusion      No facility-administered medications prior to visit.     Allergies    Allergen Reactions  . Metoprolol Tartrate Other (See Comments)    Chest pain, increased palpitations    ROS Review of Systems  Constitutional: Negative for diaphoresis, fatigue, fever and unexpected weight change.  HENT: Negative.   Eyes: Negative for photophobia and visual disturbance.  Respiratory: Negative.  Negative for chest tightness.   Cardiovascular: Negative.   Gastrointestinal: Negative.   Skin: Positive for color change. Negative for rash and wound.  Allergic/Immunologic: Negative for immunocompromised state.  Psychiatric/Behavioral: Negative.       Objective:    Physical Exam  Constitutional: She is oriented to person, place, and time. She appears well-developed and well-nourished. No distress.  HENT:  Head: Normocephalic and atraumatic.  Right Ear: External ear normal.  Left Ear: External ear normal.  Eyes: Right eye exhibits no discharge. Left eye exhibits no discharge. No scleral icterus.  Neck: No JVD present. No tracheal deviation present.  Pulmonary/Chest: Effort normal. No stridor.  Neurological: She is alert and oriented to person, place, and time.  Skin: No rash noted. She is not diaphoretic. There is erythema. No pallor.     Psychiatric: She has a normal mood and affect. Her behavior is normal.    BP 120/68   Pulse 83   Temp (!) 97.5 F (36.4 C) (Oral)   Ht 5\' 10"  (1.778 m)   Wt 186 lb 8 oz (84.6 kg)   SpO2 97%   BMI 26.76 kg/m  Wt Readings from Last 3 Encounters:  02/17/18 186 lb 8 oz (84.6 kg)  11/20/17 185 lb (83.9 kg)  08/02/17 186 lb 3.2 oz (84.5 kg)   BP Readings from Last 3 Encounters:  02/17/18 120/68  11/20/17 131/61  08/02/17 124/64   Guideline developer:  UpToDate (see UpToDate for funding source) Date Released: June 2014  Health Maintenance Due  Topic Date Due  . INFLUENZA VACCINE  09/26/2017    There are no preventive care reminders to display for this patient.  Lab Results  Component Value Date   TSH 1.37  05/21/2017   Lab Results  Component Value Date   WBC 6.2 05/21/2017   HGB 13.5 05/21/2017   HCT 40.1 05/21/2017   MCV 92.2 05/21/2017  PLT 273.0 05/21/2017   Lab Results  Component Value Date   NA 140 11/20/2017   K 3.7 11/20/2017   CO2 30 11/20/2017   GLUCOSE 100 (H) 11/20/2017   BUN 15 11/20/2017   CREATININE 0.63 11/20/2017   BILITOT 0.6 05/21/2017   ALKPHOS 39 05/21/2017   AST 26 05/21/2017   ALT 19 05/21/2017   PROT 7.2 05/21/2017   ALBUMIN 4.5 05/21/2017   CALCIUM 10.0 11/20/2017   GFR 102.72 11/20/2017   Lab Results  Component Value Date   CHOL 214 (H) 05/21/2017   Lab Results  Component Value Date   HDL 94.50 05/21/2017   Lab Results  Component Value Date   LDLCALC 105 (H) 05/21/2017   Lab Results  Component Value Date   TRIG 72.0 05/21/2017   Lab Results  Component Value Date   CHOLHDL 2 05/21/2017   No results found for: HGBA1C    Assessment & Plan:   Problem List Items Addressed This Visit      Other   Inclusion cyst - Primary   Relevant Medications   doxycycline (VIBRA-TABS) 100 MG tablet      Meds ordered this encounter  Medications  . doxycycline (VIBRA-TABS) 100 MG tablet    Sig: Take 1 tablet (100 mg total) by mouth 2 (two) times daily.    Dispense:  20 tablet    Refill:  0    Follow-up: No follow-ups on file.   Treat with doxycycline and warm compresses over the next few days.  Follow-up on Thursday for reevaluation and possible I&D if needed.  Information was given to the patient on inclusion cyst.

## 2018-02-20 ENCOUNTER — Encounter: Payer: Self-pay | Admitting: Family Medicine

## 2018-02-20 ENCOUNTER — Ambulatory Visit (INDEPENDENT_AMBULATORY_CARE_PROVIDER_SITE_OTHER): Payer: Managed Care, Other (non HMO) | Admitting: Family Medicine

## 2018-02-20 VITALS — BP 136/80 | HR 64 | Temp 98.3°F | Ht 70.0 in | Wt 186.0 lb

## 2018-02-20 DIAGNOSIS — L720 Epidermal cyst: Secondary | ICD-10-CM | POA: Diagnosis not present

## 2018-02-20 NOTE — Progress Notes (Signed)
Established Patient Office Visit  Subjective:  Patient ID: Lauren Walters, female    DOB: 06/06/1960  Age: 57 y.o. MRN: [id number]  CC:  Chief Complaint  Patient presents with  . Follow-up    HPI Lauren Walters presents for consideration of I&D of inflamed/infected inclusion cyst.  Lesion has become red and swollen with increasing tenderness to palpation.  There is been no spontaneous discharge or fever or chills.  Patient is tolerating the antibiotic.  She reminds me that she is status post right hip replacement 10 years ago.  Past Medical History:  Diagnosis Date  . Arthritis   . GERD (gastroesophageal reflux disease)   . History of chicken pox   . Hypertension   . Osteopenia     Past Surgical History:  Procedure Laterality Date  . CESAREAN SECTION  1995  . CESAREAN SECTION  1997  . CHOLECYSTECTOMY  1991  . JOINT REPLACEMENT     R hip  . OVARIAN CYST REMOVAL  1990  . ROOT CANAL    . TOTAL HIP ARTHROPLASTY  2011  . WISDOM TOOTH EXTRACTION      Family History  Problem Relation Age of Onset  . Alcohol abuse Father        deceased  . Cancer Father        prostate, basal cell   . Arthritis Father   . Heart disease Father   . Arthritis Paternal Grandfather   . Heart disease Paternal Grandfather   . Dementia Mother   . Hypertension Mother   . Heart disease Maternal Grandfather   . Colon cancer Neg Hx     Social History   Socioeconomic History  . Marital status: Married    Spouse name: Not on file  . Number of children: Not on file  . Years of education: Not on file  . Highest education level: Not on file  Occupational History  . Not on file  Social Needs  . Financial resource strain: Not on file  . Food insecurity:    Worry: Not on file    Inability: Not on file  . Transportation needs:    Medical: Not on file    Non-medical: Not on file  Tobacco Use  . Smoking status: Former Smoker    Last attempt to quit: 05/17/2015    Years since  quitting: 2.7  . Smokeless tobacco: Never Used  Substance and Sexual Activity  . Alcohol use: Yes    Alcohol/week: 14.0 standard drinks    Types: 14 Glasses of wine per week    Comment: 2 glasses of wine a night  . Drug use: No  . Sexual activity: Not on file  Lifestyle  . Physical activity:    Days per week: Not on file    Minutes per session: Not on file  . Stress: Not on file  Relationships  . Social connections:    Talks on phone: Not on file    Gets together: Not on file    Attends religious service: Not on file    Active member of club or organization: Not on file    Attends meetings of clubs or organizations: Not on file    Relationship status: Not on file  . Intimate partner violence:    Fear of current or ex partner: Not on file    Emotionally abused: Not on file    Physically abused: Not on file    Forced sexual activity: Not on file  Other  Topics Concern  . 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 cetirizine (ZYRTEC) 10 MG tablet Take 1 tablet (10 mg total) by mouth daily. 30 tablet 11  . doxycycline (VIBRA-TABS) 100 MG tablet Take 1 tablet (100 mg total) by mouth 2 (two) times daily. 20 tablet 0  . estradiol (ESTRING) 2 MG vaginal ring PLACE 1 RING VAGINALLY EVERY 3 MONTHS AS DIRECTED 1 each 3  . fluticasone (FLONASE) 50 MCG/ACT nasal spray Place 2 sprays into both nostrils daily. 16 g 6  . hydrochlorothiazide (HYDRODIURIL) 25 MG tablet Take 1 tablet (25 mg total) by mouth daily. 90 tablet 1  . lisinopril (PRINIVIL,ZESTRIL) 5 MG tablet Take 1 tablet (5 mg total) by mouth daily. 90 tablet 1  . omeprazole (PRILOSEC) 40 MG capsule Take 1 capsule (40 mg total) by mouth daily. 90 capsule 3   No facility-administered medications prior to visit.       Allergies  Allergen Reactions  . Metoprolol Tartrate Other (See Comments)    Chest pain, increased palpitations    ROS Review of Systems  Constitutional: Negative.   HENT: Negative.   Respiratory: Negative.   Cardiovascular: Negative.   Gastrointestinal: Negative.   Musculoskeletal: Negative for arthralgias and myalgias.  Skin: Positive for color change. Negative for wound.  Allergic/Immunologic: Negative for immunocompromised state.  Neurological: Negative for weakness and numbness.  Hematological: Does not bruise/bleed easily.  Psychiatric/Behavioral: Negative.       Objective:    Physical Exam  Constitutional: She is oriented to person, place, and time. She appears well-developed and well-nourished. No distress.  HENT:  Head: Normocephalic and atraumatic.  Right Ear: External ear normal.  Left Ear: External ear normal.  Eyes: Conjunctivae are normal. Right eye exhibits no discharge. Left eye exhibits no discharge. No scleral icterus.  Neck: No JVD present. No tracheal deviation present.  Pulmonary/Chest: Effort normal. No stridor.  Neurological: She is alert and oriented to person, place, and time.  Skin: Skin is warm and dry. She is not diaphoretic.     Psychiatric: She has a normal mood and affect. Her behavior is normal.    BP 136/80   Pulse 64   Temp 98.3 F (36.8 C) (Oral)   Ht 5\' 10"  (1.778 m)   Wt 186 lb (84.4 kg)   SpO2 97%   BMI 26.69 kg/m  Wt Readings from Last 3 Encounters:  02/20/18 186 lb (84.4 kg)  02/17/18 186 lb 8 oz (84.6 kg)  11/20/17 185 lb (83.9 kg)   BP Readings from Last 3 Encounters:  02/20/18 136/80  02/17/18 120/68  11/20/17 131/61   Guideline developer:  UpToDate (see UpToDate for funding source) Date Released: June 2014  Health Maintenance Due  Topic Date Due  . INFLUENZA VACCINE  09/26/2017   Aspiration/Injection Procedure Note Lauren Walters [id number] 02-09-1961  Procedure: Aspiration Indications:  inflammed inculsion cyst  Procedure Details Consent: Risks of procedure as well as the alternatives and risks of each were explained to the (patient/caregiver).  Consent for procedure obtained. Time Out: Verified patient identification, verified procedure, site/side was marked, verified correct patient position, special equipment/implants available, medications/allergies/relevent history reviewed, required imaging and test results available.  Performed   Local Anesthesia Used:Lidocaine  with 1% epi Amount of Fluid Aspirated: minimal amount Character of Fluid: purulent appearing Fluid was sent EHU:[id number] A sterile dressing was applied.  Patient did tolerate procedure well. Estimated blood loss: 0  Wound was packed with quarter inch tape.  Libby Maw 02/20/2018, 10:16 AM  There are no preventive care reminders to display for this patient.  Lab Results  Component Value Date   TSH 1.37 05/21/2017   Lab Results  Component Value Date   WBC 6.2 05/21/2017   HGB 13.5 05/21/2017   HCT 40.1 05/21/2017   MCV 92.2 05/21/2017   PLT 273.0 05/21/2017   Lab Results  Component Value Date   NA 140 11/20/2017   K 3.7 11/20/2017   CO2 30 11/20/2017   GLUCOSE 100 (H) 11/20/2017   BUN 15 11/20/2017   CREATININE 0.63 11/20/2017   BILITOT 0.6 05/21/2017   ALKPHOS 39 05/21/2017   AST 26 05/21/2017   ALT 19 05/21/2017   PROT 7.2 05/21/2017   ALBUMIN 4.5 05/21/2017   CALCIUM 10.0 11/20/2017   GFR 102.72 11/20/2017   Lab Results  Component Value Date   CHOL 214 (H) 05/21/2017   Lab Results  Component Value Date   HDL 94.50 05/21/2017   Lab Results  Component Value Date   LDLCALC 105 (H) 05/21/2017   Lab Results  Component Value Date   TRIG 72.0 05/21/2017   Lab Results  Component Value Date   CHOLHDL 2 05/21/2017   No results found for: HGBA1C    Assessment & Plan:   Problem List Items Addressed This Visit      Other   Inclusion cyst - Primary   Relevant  Orders   Wound culture   Wound culture      No orders of the defined types were placed in this encounter.   Follow-up: Return in about 1 day (around 02/21/2018), or Continue doxy and follow up tomorrow for repacking. .    Patient will continue doxycycline.  She will follow-up tomorrow for repacking.

## 2018-02-21 ENCOUNTER — Ambulatory Visit (INDEPENDENT_AMBULATORY_CARE_PROVIDER_SITE_OTHER): Payer: Managed Care, Other (non HMO) | Admitting: Family Medicine

## 2018-02-21 ENCOUNTER — Encounter: Payer: Self-pay | Admitting: Family Medicine

## 2018-02-21 VITALS — BP 120/70 | HR 71 | Ht 70.0 in

## 2018-02-21 DIAGNOSIS — L720 Epidermal cyst: Secondary | ICD-10-CM

## 2018-02-21 MED ORDER — TRAMADOL HCL 50 MG PO TABS: 50.0000 mg | ORAL_TABLET | Freq: Four times a day (QID) | ORAL | 0 refills | Status: AC | PRN

## 2018-02-21 NOTE — Progress Notes (Signed)
Established Patient Office Visit  Subjective:  Patient ID: Lauren Walters, female    DOB: January 03, 1961  Age: 57 y.o. MRN: [id number]  CC:  Chief Complaint  Patient presents with  . Follow-up    HPI RAKEYA GLAB presents for follow-up status post I&D of inflamed inclusion cyst.  Patient says that it is been painful.  There is been some drainage.  She has tried Tylenol and Naprosyn.  Denies fever or chills.  Past Medical History:  Diagnosis Date  . Arthritis   . GERD (gastroesophageal reflux disease)   . History of chicken pox   . Hypertension   . Osteopenia     Past Surgical History:  Procedure Laterality Date  . CESAREAN SECTION  1995  . CESAREAN SECTION  1997  . CHOLECYSTECTOMY  1991  . JOINT REPLACEMENT     R hip  . OVARIAN CYST REMOVAL  1990  . ROOT CANAL    . TOTAL HIP ARTHROPLASTY  2011  . WISDOM TOOTH EXTRACTION      Family History  Problem Relation Age of Onset  . Alcohol abuse Father        deceased  . Cancer Father        prostate, basal cell   . Arthritis Father   . Heart disease Father   . Arthritis Paternal Grandfather   . Heart disease Paternal Grandfather   . Dementia Mother   . Hypertension Mother   . Heart disease Maternal Grandfather   . Colon cancer Neg Hx     Social History   Socioeconomic History  . Marital status: Married    Spouse name: Not on file  . Number of children: Not on file  . Years of education: Not on file  . Highest education level: Not on file  Occupational History  . Not on file  Social Needs  . Financial resource strain: Not on file  . Food insecurity:    Worry: Not on file    Inability: Not on file  . Transportation needs:    Medical: Not on file    Non-medical: Not on file  Tobacco Use  . Smoking status: Former Smoker    Last attempt to quit: 05/17/2015    Years since quitting: 2.7  . Smokeless tobacco: Never Used  Substance and Sexual Activity  . Alcohol use: Yes    Alcohol/week: 14.0  standard drinks    Types: 14 Glasses of wine per week    Comment: 2 glasses of wine a night  . Drug use: No  . Sexual activity: Not on file  Lifestyle  . Physical activity:    Days per week: Not on file    Minutes per session: Not on file  . Stress: Not on file  Relationships  . Social connections:    Talks on phone: Not on file    Gets together: Not on file    Attends religious service: Not on file    Active member of club or organization: Not on file    Attends meetings of clubs or organizations: Not on file    Relationship status: Not on file  . Intimate partner violence:    Fear of current or ex partner: Not on file    Emotionally abused: Not on file    Physically abused: Not on file    Forced sexual activity: Not on file  Other Topics Concern  . 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 cetirizine (ZYRTEC) 10 MG tablet Take 1 tablet (10 mg total) by mouth daily. 30 tablet 11  . doxycycline (VIBRA-TABS) 100 MG tablet Take 1 tablet (100 mg total) by mouth 2 (two) times daily. 20 tablet 0  . estradiol (ESTRING) 2 MG vaginal ring PLACE 1 RING VAGINALLY EVERY 3 MONTHS AS DIRECTED 1 each 3  . fluticasone (FLONASE) 50 MCG/ACT nasal spray Place 2 sprays into both nostrils daily. 16 g 6  . hydrochlorothiazide (HYDRODIURIL) 25 MG tablet Take 1 tablet (25 mg total) by mouth daily. 90 tablet 1  . lisinopril (PRINIVIL,ZESTRIL) 5 MG tablet Take 1 tablet (5 mg total) by mouth daily. 90 tablet 1  . omeprazole (PRILOSEC) 40 MG capsule Take 1 capsule (40 mg total) by mouth daily. 90 capsule 3   No facility-administered medications prior to visit.     Allergies  Allergen Reactions  . Metoprolol Tartrate Other (See Comments)    Chest pain, increased palpitations     ROS Review of Systems  Constitutional: Negative.   Respiratory: Negative.   Cardiovascular: Negative.   Gastrointestinal: Negative.   Skin: Positive for color change and wound.  Psychiatric/Behavioral: Negative.       Objective:    Physical Exam  Constitutional: She appears well-developed and well-nourished. No distress.  HENT:  Head: Normocephalic and atraumatic.  Right Ear: External ear normal.  Left Ear: External ear normal.  Eyes: Right eye exhibits no discharge. Left eye exhibits no discharge. No scleral icterus.  Pulmonary/Chest: Effort normal.  Skin: She is not diaphoretic.     Psychiatric: She has a normal mood and affect. Her behavior is normal.   Procedure note: Wound cavity was infused with 3 cc of 2% lidocaine without.  Time was given for the anesthetic to take effect.  Wound was repacked with quarter inch tape.  Wound was then redressed.  BP 120/70   Pulse 71   Ht 5\' 10"  (1.778 m)   SpO2 100%   BMI 26.69 kg/m  Wt Readings from Last 3 Encounters:  02/20/18 186 lb (84.4 kg)  02/17/18 186 lb 8 oz (84.6 kg)  11/20/17 185 lb (83.9 kg)   BP Readings from Last 3 Encounters:  02/21/18 120/70  02/20/18 136/80  02/17/18 120/68   Guideline developer:  UpToDate (see UpToDate for funding source) Date Released: June 2014  Health Maintenance Due  Topic Date Due  . INFLUENZA VACCINE  09/26/2017    There are no preventive care reminders to display for this patient.  Lab Results  Component Value Date   TSH 1.37 05/21/2017   Lab Results  Component Value Date   WBC 6.2 05/21/2017   HGB 13.5 05/21/2017   HCT 40.1 05/21/2017   MCV 92.2 05/21/2017   PLT 273.0 05/21/2017   Lab Results  Component Value Date   NA 140 11/20/2017   K 3.7 11/20/2017   CO2 30 11/20/2017   GLUCOSE 100 (H) 11/20/2017   BUN 15 11/20/2017   CREATININE 0.63 11/20/2017   BILITOT 0.6 05/21/2017   ALKPHOS 39 05/21/2017   AST 26 05/21/2017   ALT 19 05/21/2017   PROT 7.2  05/21/2017   ALBUMIN 4.5 05/21/2017   CALCIUM 10.0 11/20/2017   GFR 102.72 11/20/2017   Lab Results  Component Value Date   CHOL 214 (H) 05/21/2017   Lab Results  Component Value Date   HDL 94.50 05/21/2017   Lab Results  Component Value Date   LDLCALC 105 (H) 05/21/2017   Lab Results  Component Value Date   TRIG 72.0 05/21/2017   Lab Results  Component Value Date   CHOLHDL 2 05/21/2017   No results found for: HGBA1C    Assessment & Plan:   Problem List Items Addressed This Visit      Other   Inclusion cyst - Primary   Relevant Medications   traMADol (ULTRAM) 50 MG tablet      Meds ordered this encounter  Medications  . traMADol (ULTRAM) 50 MG tablet    Sig: Take 1 tablet (50 mg total) by mouth every 6 (six) hours as needed for up to 3 days.    Dispense:  12 tablet    Refill:  0    Follow-up: No follow-ups on file.   Patient to use Ultram as needed.  Follow-up on Monday for repacking.

## 2018-02-24 ENCOUNTER — Encounter: Payer: Self-pay | Admitting: Family Medicine

## 2018-02-24 ENCOUNTER — Ambulatory Visit (INDEPENDENT_AMBULATORY_CARE_PROVIDER_SITE_OTHER): Payer: Managed Care, Other (non HMO) | Admitting: Family Medicine

## 2018-02-24 VITALS — BP 110/70 | HR 61 | Ht 70.0 in

## 2018-02-24 DIAGNOSIS — L720 Epidermal cyst: Secondary | ICD-10-CM

## 2018-02-24 LAB — WOUND CULTURE
MICRO NUMBER: 91540976
SPECIMEN QUALITY: ADEQUATE

## 2018-02-24 NOTE — Progress Notes (Signed)
Established Patient Office Visit  Subjective:  Patient ID: Lauren Walters, female    DOB: [birth year]  Age: 57 y.o. MRN: [id number]  CC:  Chief Complaint  Patient presents with  . Follow-up    HPI Lauren Walters presents for follow-up of her inclusion cyst status post I&D.  There continues to be some drainage but it has lessened.  There is little pain at this point.  The redness and swelling is receding.  There have been no fevers or chills.  Past Medical History:  Diagnosis Date  . Arthritis   . GERD (gastroesophageal reflux disease)   . History of chicken pox   . Hypertension   . Osteopenia     Past Surgical History:  Procedure Laterality Date  . CESAREAN SECTION  1995  . CESAREAN SECTION  1997  . CHOLECYSTECTOMY  1991  . JOINT REPLACEMENT     R hip  . OVARIAN CYST REMOVAL  1990  . ROOT CANAL    . TOTAL HIP ARTHROPLASTY  2011  . WISDOM TOOTH EXTRACTION      Family History  Problem Relation Age of Onset  . Alcohol abuse Father        deceased  . Cancer Father        prostate, basal cell   . Arthritis Father   . Heart disease Father   . Arthritis Paternal Grandfather   . Heart disease Paternal Grandfather   . Dementia Mother   . Hypertension Mother   . Heart disease Maternal Grandfather   . Colon cancer Neg Hx     Social History   Socioeconomic History  . Marital status: Married    Spouse name: Not on file  . Number of children: Not on file  . Years of education: Not on file  . Highest education level: Not on file  Occupational History  . Not on file  Social Needs  . Financial resource strain: Not on file  . Food insecurity:    Worry: Not on file    Inability: Not on file  . Transportation needs:    Medical: Not on file    Non-medical: Not on file  Tobacco Use  . Smoking status: Former Smoker    Last attempt to quit: 05/17/2015    Years since quitting: 2.7  . Smokeless tobacco: Never Used  Substance and Sexual Activity  . Alcohol  use: Yes    Alcohol/week: 14.0 standard drinks    Types: 14 Glasses of wine per week    Comment: 2 glasses of wine a night  . Drug use: No  . Sexual activity: Not on file  Lifestyle  . Physical activity:    Days per week: Not on file    Minutes per session: Not on file  . Stress: Not on file  Relationships  . Social connections:    Talks on phone: Not on file    Gets together: Not on file    Attends religious service: Not on file    Active member of club or organization: Not on file    Attends meetings of clubs or organizations: Not on file    Relationship status: Not on file  . Intimate partner violence:    Fear of current or ex partner: Not on file    Emotionally abused: Not on file    Physically abused: Not on file    Forced sexual activity: Not on file  Other Topics Concern  . 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 cetirizine (ZYRTEC) 10 MG tablet Take 1 tablet (10 mg total) by mouth daily. 30 tablet 11  . doxycycline (VIBRA-TABS) 100 MG tablet Take 1 tablet (100 mg total) by mouth 2 (two) times daily. 20 tablet 0  . estradiol (ESTRING) 2 MG vaginal ring PLACE 1 RING VAGINALLY EVERY 3 MONTHS AS DIRECTED 1 each 3  . fluticasone (FLONASE) 50 MCG/ACT nasal spray Place 2 sprays into both nostrils daily. 16 g 6  . hydrochlorothiazide (HYDRODIURIL) 25 MG tablet Take 1 tablet (25 mg total) by mouth daily. 90 tablet 1  . lisinopril (PRINIVIL,ZESTRIL) 5 MG tablet Take 1 tablet (5 mg total) by mouth daily. 90 tablet 1  . omeprazole (PRILOSEC) 40 MG capsule Take 1 capsule (40 mg total) by mouth daily. 90 capsule 3  . traMADol (ULTRAM) 50 MG tablet Take 1 tablet (50 mg total) by mouth every 6 (six) hours as needed for up to 3 days. 12 tablet 0   No  facility-administered medications prior to visit.     Allergies  Allergen Reactions  . Metoprolol Tartrate Other (See Comments)    Chest pain, increased palpitations    ROS Review of Systems  Constitutional: Negative.   Skin: Positive for wound. Negative for color change and pallor.  Psychiatric/Behavioral: Negative.       Objective:    Physical Exam  Constitutional: She is oriented to person, place, and time. She appears well-developed and well-nourished. No distress.  Neurological: She is alert and oriented to person, place, and time.  Skin: She is not diaphoretic.  Wound looks good there is decreased erythema and swelling.  Packing from yesterday morning is in place.  There is some pustular drainage on the overlying gauze pad. Procedure note: Packing was removed.  Wound cavity was injected with 3 cc of 2% lidocaine without.  Wound was repacked.  Patient tolerated well.  Psychiatric: She has a normal mood and affect. Her behavior is normal.    BP 110/70   Pulse 61   Ht 5\' 10"  (1.778 m)   SpO2 97%   BMI 26.69 kg/m  Wt Readings from Last 3 Encounters:  02/20/18 186 lb (84.4 kg)  02/17/18 186 lb 8 oz (84.6 kg)  11/20/17 185 lb (83.9 kg)   BP Readings from Last 3 Encounters:  02/24/18 110/70  02/21/18 120/70  02/20/18 136/80   Guideline developer:  UpToDate (see UpToDate for funding source) Date Released: June 2014  Health Maintenance Due  Topic Date Due  . INFLUENZA VACCINE  09/26/2017    There are no preventive care reminders to display for this patient.  Lab Results  Component Value Date   TSH 1.37 05/21/2017   Lab Results  Component Value Date   WBC 6.2 05/21/2017   HGB 13.5 05/21/2017   HCT 40.1 05/21/2017   MCV 92.2 05/21/2017   PLT 273.0 05/21/2017   Lab Results  Component Value Date   NA 140 11/20/2017   K 3.7 11/20/2017   CO2 30 11/20/2017   GLUCOSE 100 (H) 11/20/2017   BUN 15 11/20/2017   CREATININE 0.63 11/20/2017   BILITOT 0.6  05/21/2017   ALKPHOS 39 05/21/2017   AST 26 05/21/2017   ALT 19 05/21/2017  PROT 7.2 05/21/2017   ALBUMIN 4.5 05/21/2017   CALCIUM 10.0 11/20/2017   GFR 102.72 11/20/2017   Lab Results  Component Value Date   CHOL 214 (H) 05/21/2017   Lab Results  Component Value Date   HDL 94.50 05/21/2017   Lab Results  Component Value Date   LDLCALC 105 (H) 05/21/2017   Lab Results  Component Value Date   TRIG 72.0 05/21/2017   Lab Results  Component Value Date   CHOLHDL 2 05/21/2017   No results found for: HGBA1C    Assessment & Plan:   Problem List Items Addressed This Visit    None      No orders of the defined types were placed in this encounter.  Patient will remove the packing in 2 days and cover the wound until it is sealed.  She will follow-up with any issues or concerns. Follow-up: Return remove packing in 2 days and cover until wound is sealed. Marland Kitchen

## 2018-05-27 ENCOUNTER — Encounter: Payer: Managed Care, Other (non HMO) | Admitting: Family

## 2018-06-12 ENCOUNTER — Other Ambulatory Visit: Payer: Self-pay | Admitting: Family

## 2018-06-12 NOTE — Telephone Encounter (Signed)
30 day refills sent but patient is due for follow up. Please arrange virtual visit.

## 2018-07-10 ENCOUNTER — Other Ambulatory Visit: Payer: Self-pay | Admitting: Family

## 2018-07-12 ENCOUNTER — Encounter: Payer: Self-pay | Admitting: Family

## 2018-08-01 ENCOUNTER — Encounter: Payer: Managed Care, Other (non HMO) | Admitting: Family

## 2018-08-11 ENCOUNTER — Other Ambulatory Visit: Payer: Self-pay | Admitting: Family

## 2018-08-12 ENCOUNTER — Encounter: Payer: Self-pay | Admitting: Family

## 2018-08-12 ENCOUNTER — Other Ambulatory Visit: Payer: Self-pay

## 2018-08-12 ENCOUNTER — Ambulatory Visit (INDEPENDENT_AMBULATORY_CARE_PROVIDER_SITE_OTHER): Payer: Managed Care, Other (non HMO) | Admitting: Family

## 2018-08-12 VITALS — BP 141/78 | HR 62 | Temp 98.2°F | Resp 16 | Ht 70.0 in | Wt 169.0 lb

## 2018-08-12 DIAGNOSIS — Z0000 Encounter for general adult medical examination without abnormal findings: Secondary | ICD-10-CM

## 2018-08-12 LAB — LIPID PANEL
Cholesterol: 202 mg/dL — ABNORMAL HIGH (ref 0–200)
HDL: 92.3 mg/dL (ref >39.00)
LDL Cholesterol: 80 mg/dL (ref 0–99)
NonHDL: 109.81
Total CHOL/HDL Ratio: 2
Triglycerides: 148 mg/dL (ref 0.0–149.0)
VLDL: 29.6 mg/dL (ref 0.0–40.0)

## 2018-08-12 LAB — CBC WITH DIFFERENTIAL/PLATELET
Basophils Absolute: 0 10*3/uL (ref 0.0–0.1)
Basophils Relative: 0.6 % (ref 0.0–3.0)
Eosinophils Absolute: 0.1 10*3/uL (ref 0.0–0.7)
Eosinophils Relative: 1.9 % (ref 0.0–5.0)
HCT: 41.4 % (ref 36.0–46.0)
Hemoglobin: 14 g/dL (ref 12.0–15.0)
Lymphocytes Relative: 23 % (ref 12.0–46.0)
Lymphs Abs: 1.6 10*3/uL (ref 0.7–4.0)
MCHC: 33.7 g/dL (ref 30.0–36.0)
MCV: 93.8 fl (ref 78.0–100.0)
Monocytes Absolute: 0.6 10*3/uL (ref 0.1–1.0)
Monocytes Relative: 8.5 % (ref 3.0–12.0)
Neutro Abs: 4.6 10*3/uL (ref 1.4–7.7)
Neutrophils Relative %: 66 % (ref 43.0–77.0)
Platelets: 264 10*3/uL (ref 150.0–400.0)
RBC: 4.42 Mil/uL (ref 3.87–5.11)
RDW: 13.8 % (ref 11.5–15.5)
WBC: 7 10*3/uL (ref 4.0–10.5)

## 2018-08-12 LAB — BASIC METABOLIC PANEL
BUN: 17 mg/dL (ref 6–23)
CO2: 32 mEq/L (ref 19–32)
Calcium: 9.7 mg/dL (ref 8.4–10.5)
Chloride: 98 mEq/L (ref 96–112)
Creatinine, Ser: 0.64 mg/dL (ref 0.40–1.20)
GFR: 94.67 mL/min (ref >60.00)
Glucose, Bld: 90 mg/dL (ref 70–99)
Potassium: 4.4 mEq/L (ref 3.5–5.1)
Sodium: 137 mEq/L (ref 135–145)

## 2018-08-12 LAB — HEPATIC FUNCTION PANEL
ALT: 16 U/L (ref 0–35)
AST: 19 U/L (ref 0–37)
Albumin: 4.6 g/dL (ref 3.5–5.2)
Alkaline Phosphatase: 47 U/L (ref 39–117)
Bilirubin, Direct: 0.1 mg/dL (ref 0.0–0.3)
Total Bilirubin: 0.6 mg/dL (ref 0.2–1.2)
Total Protein: 6.7 g/dL (ref 6.0–8.3)

## 2018-08-12 LAB — TSH: TSH: 1.73 u[IU]/mL (ref 0.35–4.50)

## 2018-08-12 MED ORDER — ESTRING 2 MG VA RING: VAGINAL_RING | VAGINAL | 3 refills | Status: DC

## 2018-08-12 NOTE — Progress Notes (Signed)
Subjective:    Patient ID: Lauren Walters, female    DOB: [birth year], 58 y.o.   MRN: [id number]  HPI  Patient presents today for complete physical.  Immunizations: tetanus due 02/27/19, also would like shingrix.  Diet: doing weight watchers Wt Readings from Last 3 Encounters:  08/12/18 169 lb (76.7 kg)  02/20/18 186 lb (84.4 kg)  02/17/18 186 lb 8 oz (84.6 kg)  Exercise: walking, riding her bike and going to yoga Colonoscopy: due 2022 Dexa: 2017 Pap Smear: 05/11/16 Mammogram: due     Review of Systems   See HPI  Past Medical History:  Diagnosis Date  . Arthritis   . GERD (gastroesophageal reflux disease)   . History of chicken pox   . Hypertension   . Osteopenia      Social History   Socioeconomic History  . Marital status: Married    Spouse name: Not on file  . Number of children: Not on file  . Years of education: Not on file  . Highest education level: Not on file  Occupational History  . Not on file  Social Needs  . Financial resource strain: Not on file  . Food insecurity    Worry: Not on file    Inability: Not on file  . Transportation needs    Medical: Not on file    Non-medical: Not on file  Tobacco Use  . Smoking status: Former Smoker    Quit date: 05/17/2015    Years since quitting: 3.2  . Smokeless tobacco: Never Used  Substance and Sexual Activity  . Alcohol use: Yes    Alcohol/week: 14.0 standard drinks    Types: 14 Glasses of wine per week    Comment: 2 glasses of wine a night  . Drug use: No  . Sexual activity: Not on file  Lifestyle  . Physical activity    Days per week: Not on file    Minutes per session: Not on file  . Stress: Not on file  Relationships  . Social Herbalist on phone: Not on file    Gets together: Not on file    Attends religious service: Not on file    Active member of club or organization: Not on file    Attends meetings of clubs or organizations: Not on file    Relationship status: Not on  file  . Intimate partner violence    Fear of current or ex partner: Not on file    Emotionally abused: Not on file    Physically abused: Not on file    Forced sexual activity: Not on file  Other Topics Concern  . 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 CESAREAN SECTION  1995  . CESAREAN SECTION  1997  . CHOLECYSTECTOMY  1991  . JOINT REPLACEMENT     R hip  . OVARIAN CYST REMOVAL  1990  . ROOT CANAL    . TOTAL HIP ARTHROPLASTY  2011  . WISDOM TOOTH EXTRACTION      Family History  Problem Relation Age of Onset  . Alcohol abuse Father        deceased  . Cancer Father        prostate, basal cell   .  Arthritis Father   . Heart disease Father   . Arthritis Paternal Grandfather   . Heart disease Paternal Grandfather   . Dementia Mother   . Hypertension Mother   . Heart disease Maternal Grandfather   . Colon cancer Neg Hx     Allergies  Allergen Reactions  . Metoprolol Tartrate Other (See Comments)    Chest pain, increased palpitations    Current Outpatient Medications on File Prior to Visit  Medication Sig Dispense Refill  . cetirizine (ZYRTEC) 10 MG tablet Take 1 tablet (10 mg total) by mouth daily. 30 tablet 11  . estradiol (ESTRING) 2 MG vaginal ring PLACE 1 RING VAGINALLY EVERY 3 MONTHS AS DIRECTED 1 each 3  . hydrochlorothiazide (HYDRODIURIL) 25 MG tablet TAKE ONE TABLET BY MOUTH DAILY 90 tablet 1  . lisinopril (ZESTRIL) 5 MG tablet TAKE ONE TABLET BY MOUTH DAILY 90 tablet 1  . omeprazole (PRILOSEC) 40 MG capsule Take 1 capsule (40 mg total) by mouth daily. 90 capsule 3   No current facility-administered medications on file prior to visit.     BP (!) 141/78 (BP Location: Right Arm, Patient Position: Sitting, Cuff Size: Small)   Pulse 62   Temp 98.2 F  (36.8 C) (Oral)   Resp 16   Ht 5\' 10"  (1.778 m)   Wt 169 lb (76.7 kg)   SpO2 100%   BMI 24.25 kg/m        Objective:   Physical Exam  Physical Exam  Constitutional: She is oriented to person, place, and time. She appears well-developed and well-nourished. No distress.  HENT:  Head: Normocephalic and atraumatic.  Right Ear: Tympanic membrane and ear canal normal.  Left Ear: Tympanic membrane and ear canal normal.  Mouth/Throat: Oropharynx is clear and moist.  Eyes: Pupils are equal, round, and reactive to light. No scleral icterus.  Neck: Normal range of motion. No thyromegaly present.  Cardiovascular: Normal rate and regular rhythm.   No murmur heard. Pulmonary/Chest: Effort normal and breath sounds normal. No respiratory distress. He has no wheezes. She has no rales. She exhibits no tenderness.  Abdominal: Soft. Bowel sounds are normal. She exhibits no distension and no mass. There is no tenderness. There is no rebound and no guarding.  Musculoskeletal: She exhibits no edema.  Lymphadenopathy:    She has no cervical adenopathy.  Neurological: She is alert and oriented to person, place, and time. She has normal patellar reflexes. She exhibits normal muscle tone. Coordination normal.  Skin: Skin is warm and dry.  Psychiatric: She has a normal mood and affect. Her behavior is normal. Judgment and thought content normal.  Breast/pelvic: deferred      Assessment & Plan:   Preventative care- commended pt on her weight loss and exercise. Obtain follow up lab work. Refer for mammo/dexa. Colo is up to date.      Assessment & Plan:

## 2018-08-12 NOTE — Patient Instructions (Signed)
Great job with the weight loss! Continue healthy diet and regular exercise.

## 2018-08-13 ENCOUNTER — Telehealth: Payer: Self-pay | Admitting: Family

## 2018-08-13 DIAGNOSIS — M8580 Other specified disorders of bone density and structure, unspecified site: Secondary | ICD-10-CM

## 2018-08-13 NOTE — Telephone Encounter (Signed)
Please let pt know that I reviewed her chart and would also like her to repeat her bone density. Order has been placed.

## 2018-08-26 ENCOUNTER — Encounter: Payer: Self-pay | Admitting: Family

## 2018-09-01 ENCOUNTER — Encounter (HOSPITAL_BASED_OUTPATIENT_CLINIC_OR_DEPARTMENT_OTHER): Payer: Self-pay

## 2018-09-01 ENCOUNTER — Ambulatory Visit (HOSPITAL_BASED_OUTPATIENT_CLINIC_OR_DEPARTMENT_OTHER)
Admission: RE | Admit: 2018-09-01 | Discharge: 2018-09-01 | Disposition: A | Discharge status: Home / Self Care | Payer: Managed Care, Other (non HMO) | Source: Ambulatory Visit | Attending: Family | Admitting: Family

## 2018-09-01 ENCOUNTER — Other Ambulatory Visit: Payer: Self-pay

## 2018-09-01 DIAGNOSIS — M8580 Other specified disorders of bone density and structure, unspecified site: Secondary | ICD-10-CM | POA: Insufficient documentation

## 2018-09-01 DIAGNOSIS — Z0000 Encounter for general adult medical examination without abnormal findings: Secondary | ICD-10-CM | POA: Insufficient documentation

## 2018-09-02 ENCOUNTER — Encounter: Payer: Self-pay | Admitting: Family

## 2018-09-02 ENCOUNTER — Other Ambulatory Visit: Payer: Self-pay | Admitting: Family

## 2018-09-02 MED ORDER — CALCIUM CARBONATE-VITAMIN D 600-400 MG-UNIT PO CHEW: 1.0000 | CHEWABLE_TABLET | Freq: Two times a day (BID) | ORAL | Status: AC

## 2018-09-25 ENCOUNTER — Encounter: Payer: Self-pay | Admitting: Family

## 2018-09-25 DIAGNOSIS — Z720 Tobacco use: Secondary | ICD-10-CM

## 2018-09-29 NOTE — Addendum Note (Signed)
Addended by: Debbrah Alar on: 09/29/2018 02:25 PM   Modules accepted: Orders

## 2018-10-02 ENCOUNTER — Encounter: Payer: Self-pay | Admitting: Family

## 2018-10-02 DIAGNOSIS — Z122 Encounter for screening for malignant neoplasm of respiratory organs: Secondary | ICD-10-CM

## 2018-10-16 ENCOUNTER — Ambulatory Visit (HOSPITAL_BASED_OUTPATIENT_CLINIC_OR_DEPARTMENT_OTHER): Payer: Managed Care, Other (non HMO)

## 2018-10-16 ENCOUNTER — Other Ambulatory Visit: Payer: Self-pay

## 2018-10-16 ENCOUNTER — Ambulatory Visit (INDEPENDENT_AMBULATORY_CARE_PROVIDER_SITE_OTHER): Payer: Managed Care, Other (non HMO) | Admitting: *Deleted

## 2018-10-16 DIAGNOSIS — Z23 Encounter for immunization: Secondary | ICD-10-CM

## 2018-10-16 NOTE — Progress Notes (Signed)
100

## 2018-10-16 NOTE — Progress Notes (Signed)
Patient here for 2nd shingrix vaccine.  Vaccine given and patient notified.

## 2018-10-21 ENCOUNTER — Ambulatory Visit: Payer: Managed Care, Other (non HMO)

## 2018-10-21 ENCOUNTER — Other Ambulatory Visit: Payer: Self-pay

## 2018-10-21 DIAGNOSIS — Z122 Encounter for screening for malignant neoplasm of respiratory organs: Secondary | ICD-10-CM

## 2018-10-29 ENCOUNTER — Encounter: Payer: Self-pay | Admitting: Family

## 2018-12-06 ENCOUNTER — Other Ambulatory Visit: Payer: Self-pay | Admitting: Family

## 2018-12-09 ENCOUNTER — Other Ambulatory Visit: Payer: Self-pay

## 2018-12-09 ENCOUNTER — Ambulatory Visit (INDEPENDENT_AMBULATORY_CARE_PROVIDER_SITE_OTHER): Payer: Managed Care, Other (non HMO)

## 2018-12-09 DIAGNOSIS — Z23 Encounter for immunization: Secondary | ICD-10-CM

## 2019-02-03 ENCOUNTER — Other Ambulatory Visit: Payer: Self-pay | Admitting: Family

## 2019-02-04 ENCOUNTER — Other Ambulatory Visit: Payer: Self-pay | Admitting: Family

## 2019-02-09 ENCOUNTER — Encounter: Payer: Self-pay | Admitting: Family Medicine

## 2019-02-09 ENCOUNTER — Ambulatory Visit (INDEPENDENT_AMBULATORY_CARE_PROVIDER_SITE_OTHER): Payer: Managed Care, Other (non HMO) | Admitting: Family Medicine

## 2019-02-09 ENCOUNTER — Ambulatory Visit: Payer: Self-pay

## 2019-02-09 ENCOUNTER — Other Ambulatory Visit: Payer: Self-pay

## 2019-02-09 VITALS — Ht 70.0 in | Wt 166.0 lb

## 2019-02-09 DIAGNOSIS — M25511 Pain in right shoulder: Secondary | ICD-10-CM | POA: Diagnosis not present

## 2019-02-09 MED ORDER — PREDNISONE 5 MG PO TABS: ORAL_TABLET | ORAL | 0 refills | Status: DC

## 2019-02-09 NOTE — Assessment & Plan Note (Signed)
Seems to be more of a capsular issue as the effusion is noted on ultrasound.  Has good range of motion.  Has some suggestion of impingement on dynamic testing on ultrasound. -Prednisone. -Sling. -Counseled on home exercise therapy and supportive care. -Could consider physical therapy or injection at follow-up.

## 2019-02-09 NOTE — Patient Instructions (Signed)
Nice to meet you Please try ice  Please try the sling  Please try the range of motion movements   Please send me a message in MyChart with any questions or updates.  Please see me back in 3 weeks.   --Dr. Raeford Razor

## 2019-02-09 NOTE — Progress Notes (Signed)
Lauren Walters - 58 y.o. female MRN [id number]  Date of birth: [birth year]  SUBJECTIVE:  Including CC & ROS.  Chief Complaint  Patient presents with  . Shoulder Pain    right shoulder x 9 days    Lauren Walters is a 58 y.o. female that is presenting with acute right shoulder pain.  The pain is been ongoing for about 10 days.  The pain is localized to the shoulder joint.  She feels the pain when she is reaching above her head.  She initially had the pain when she is trying to reach for a heavy object up above.  Since that time she has had this ongoing pain.  No history of similar pain.  Can be intermittent in nature.  Can be mild to severe.  Has a catching sensation when she is going from flexion to normal position.  No numbness or tingling.   Review of Systems  Constitutional: Negative for fever.  HENT: Negative for congestion.   Respiratory: Negative for cough.   Cardiovascular: Negative for chest pain.  Gastrointestinal: Negative for abdominal pain.  Musculoskeletal: Positive for joint swelling.  Skin: Negative for color change.  Neurological: Negative for weakness.  Hematological: Negative for adenopathy.    HISTORY: Past Medical, Surgical, Social, and Family History Reviewed & Updated per EMR.   Pertinent Historical Findings include:  Past Medical History:  Diagnosis Date  . Arthritis   . GERD (gastroesophageal reflux disease)   . History of chicken pox   . Hypertension   . Osteopenia     Past Surgical History:  Procedure Laterality Date  . CESAREAN SECTION  1995  . CESAREAN SECTION  1997  . CHOLECYSTECTOMY  1991  . JOINT REPLACEMENT     R hip  . OVARIAN CYST REMOVAL  1990  . ROOT CANAL    . TOTAL HIP ARTHROPLASTY  2011  . WISDOM TOOTH EXTRACTION      Allergies  Allergen Reactions  . Metoprolol Tartrate Other (See Comments)    Chest pain, increased palpitations    Family History  Problem Relation Age of Onset  . Alcohol abuse Father        deceased   . Cancer Father        prostate, basal cell   . Arthritis Father   . Heart disease Father   . Arthritis Paternal Grandfather   . Heart disease Paternal Grandfather   . Dementia Mother   . Hypertension Mother   . Heart disease Maternal Grandfather   . Colon cancer Neg Hx      Social History   Socioeconomic History  . Marital status: Married    Spouse name: Not on file  . Number of children: Not on file  . Years of education: Not on file  . Highest education level: Not on file  Occupational History  . Not on file  Tobacco Use  . Smoking status: Former Smoker    Quit date: 05/17/2015    Years since quitting: 3.7  . Smokeless tobacco: Never Used  Substance and Sexual Activity  . Alcohol use: Yes    Alcohol/week: 14.0 standard drinks    Types: 14 Glasses of wine per week    Comment: 2 glasses of wine a night  . Drug use: No  . Sexual activity: Not on file  Other Topics Concern  . 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 Difficulty of Paying Living Expenses: Not on file  Food Insecurity:   . Worried About Charity fundraiser in the Last Year: Not on file  . Ran Out of Food in the Last Year: Not on file  Transportation Needs:   . Lack of Transportation (Medical): Not on file  . Lack of Transportation (Non-Medical): Not on file  Physical Activity:   . Days of Exercise per Week: Not on file  . Minutes of Exercise per Session: Not on file  Stress:   . Feeling of Stress : Not on file  Social Connections:   . Frequency of Communication with Friends and Family: Not on file  . Frequency of Social Gatherings with Friends and Family: Not on file  . Attends Religious Services: Not on file  . Active Member of Clubs or Organizations: Not on file  . Attends Theatre manager Meetings: Not on file  . Marital Status: Not on file  Intimate Partner Violence:   . Fear of Current or Ex-Partner: Not on file  . Emotionally Abused: Not on file  . Physically Abused: Not on file  . Sexually Abused: Not on file     PHYSICAL EXAM:  VS: Ht 5\' 10"  (1.778 m)   Wt 166 lb (75.3 kg)   BMI 23.82 kg/m  Physical Exam Gen: NAD, alert, cooperative with exam, well-appearing ENT: normal lips, normal nasal mucosa,  Eye: normal EOM, normal conjunctiva and lids CV:  no edema, +2 pedal pulses   Resp: no accessory muscle use, non-labored,   Skin: no rashes, no areas of induration  Neuro: normal tone, normal sensation to touch Psych:  normal insight, alert and oriented MSK:  Right shoulder: Normal range of motion in flexion and abduction. Normal internal and external rotation. Normal strength resistance. Some pain with external rotation and abduction. Negative empty can test. Negative speeds test. Mild pain with O'Brien's test. Neurovascularly intact  Limited ultrasound: Right shoulder:  Biceps tendon with an encircling effusion noted in long and short axis. Normal-appearing subscapularis. Supraspinatus with chronic changes but no acute tear appreciated.  Some bunching appearing on dynamic testing which could suggest impingement. Mild effusion noted in posterior glenohumeral joint  Summary: Effusion noted of the joint.  Ultrasound and interpretation by Clearance Coots, MD    ASSESSMENT & PLAN:   Right shoulder pain Seems to be more of a capsular issue as the effusion is noted on ultrasound.  Has good range of motion.  Has some suggestion of impingement on dynamic testing on ultrasound. -Prednisone. -Sling. -Counseled on home exercise therapy and supportive care. -Could consider physical therapy or injection at follow-up.

## 2019-02-10 ENCOUNTER — Encounter: Payer: Self-pay | Admitting: Family Medicine

## 2019-02-10 ENCOUNTER — Other Ambulatory Visit: Payer: Self-pay

## 2019-02-11 ENCOUNTER — Other Ambulatory Visit: Payer: Self-pay

## 2019-02-11 ENCOUNTER — Encounter: Payer: Self-pay | Admitting: Family

## 2019-02-11 ENCOUNTER — Ambulatory Visit (INDEPENDENT_AMBULATORY_CARE_PROVIDER_SITE_OTHER): Payer: Managed Care, Other (non HMO) | Admitting: Family

## 2019-02-11 VITALS — BP 126/71 | HR 72 | Temp 96.1°F | Resp 16 | Wt 167.0 lb

## 2019-02-11 DIAGNOSIS — I10 Essential (primary) hypertension: Secondary | ICD-10-CM

## 2019-02-11 DIAGNOSIS — K219 Gastro-esophageal reflux disease without esophagitis: Secondary | ICD-10-CM | POA: Diagnosis not present

## 2019-02-11 LAB — BASIC METABOLIC PANEL
BUN: 16 mg/dL (ref 6–23)
CO2: 32 mEq/L (ref 19–32)
Calcium: 10 mg/dL (ref 8.4–10.5)
Chloride: 98 mEq/L (ref 96–112)
Creatinine, Ser: 0.67 mg/dL (ref 0.40–1.20)
GFR: 89.64 mL/min (ref >60.00)
Glucose, Bld: 114 mg/dL — ABNORMAL HIGH (ref 70–99)
Potassium: 4.3 mEq/L (ref 3.5–5.1)
Sodium: 138 mEq/L (ref 135–145)

## 2019-02-11 NOTE — Progress Notes (Signed)
Subjective:    Patient ID: Lauren Walters, female    DOB: 12/19/1960, 58 y.o.   MRN: [id number]  HPI   Patient is a 58 year old female who presents today for routine follow-up.  She is in the process of moving to a new home in [place].  Right shoulder pain-this is currently being managed by sports medicine.  She is wearing a sling.  HTN- continues hctz and lisinopril BP Readings from Last 3 Encounters:  02/11/19 126/71  08/12/18 (!) 141/78  02/24/18 110/70   GERD- continues omeprazole prn reflux.    Review of Systems    see HPI  Past Medical History:  Diagnosis Date  . Arthritis   . GERD (gastroesophageal reflux disease)   . History of chicken pox   . Hypertension   . Osteopenia      Social History   Socioeconomic History  . Marital status: Married    Spouse name: Not on file  . Number of children: Not on file  . Years of education: Not on file  . Highest education level: Not on file  Occupational History  . Not on file  Tobacco Use  . Smoking status: Former Smoker    Quit date: 05/17/2015    Years since quitting: 3.7  . Smokeless tobacco: Never Used  Substance and Sexual Activity  . Alcohol use: Yes    Alcohol/week: 14.0 standard drinks    Types: 14 Glasses of wine per week    Comment: 2 glasses of wine a night  . Drug use: No  . Sexual activity: Not on file  Other Topics Concern  . 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 Difficulty of Paying Living Expenses: Not on file  Food Insecurity:   . Worried About Charity fundraiser in the Last Year: Not on file  . Ran Out of Food in the Last Year: Not on file  Transportation Needs:   . Lack of Transportation (Medical): Not on file  . Lack of Transportation (Non-Medical): Not on file   Physical Activity:   . Days of Exercise per Week: Not on file  . Minutes of Exercise per Session: Not on file  Stress:   . Feeling of Stress : Not on file  Social Connections:   . Frequency of Communication with Friends and Family: Not on file  . Frequency of Social Gatherings with Friends and Family: Not on file  . Attends Religious Services: Not on file  . Active Member of Clubs or Organizations: Not on file  . Attends Archivist Meetings: Not on file  . Marital Status: Not on file  Intimate Partner Violence:   . Fear of Current or Ex-Partner: Not on file  . Emotionally Abused: Not on file  . Physically Abused: Not on file  . Sexually Abused: Not on file    Past Surgical History:  Procedure Laterality Date  . CESAREAN SECTION  1995  . CESAREAN SECTION  1997  . CHOLECYSTECTOMY  1991  . JOINT REPLACEMENT     R hip  . OVARIAN CYST REMOVAL  1990  . ROOT CANAL    . TOTAL HIP ARTHROPLASTY  2011  . WISDOM TOOTH EXTRACTION  Family History  Problem Relation Age of Onset  . Alcohol abuse Father        deceased  . Cancer Father        prostate, basal cell   . Arthritis Father   . Heart disease Father   . Arthritis Paternal Grandfather   . Heart disease Paternal Grandfather   . Dementia Mother   . Hypertension Mother   . Heart disease Maternal Grandfather   . Colon cancer Neg Hx     Allergies  Allergen Reactions  . Metoprolol Tartrate Other (See Comments)    Chest pain, increased palpitations    Current Outpatient Medications on File Prior to Visit  Medication Sig Dispense Refill  . Calcium Carbonate-Vitamin D 600-400 MG-UNIT chew tablet Chew 1 tablet by mouth 2 (two) times daily.    . cetirizine (ZYRTEC) 10 MG tablet Take 1 tablet (10 mg total) by mouth daily. 30 tablet 11  . estradiol (ESTRING) 2 MG vaginal ring PLACE 1 RING VAGINALLY EVERY 3 MONTHS AS DIRECTED 1 each 3  . hydrochlorothiazide (HYDRODIURIL) 25 MG tablet TAKE ONE TABLET BY MOUTH DAILY  90 tablet 0  . lisinopril (ZESTRIL) 5 MG tablet TAKE ONE TABLET BY MOUTH DAILY 30 tablet 0  . omeprazole (PRILOSEC) 40 MG capsule TAKE ONE CAPSULE BY MOUTH DAILY 30 capsule 2  . predniSONE (DELTASONE) 5 MG tablet Take 6 pills for first day, 5 pills second day, 4 pills third day, 3 pills fourth day, 2 pills the fifth day, and 1 pill sixth day. 21 tablet 0   No current facility-administered medications on file prior to visit.    BP 126/71 (BP Location: Left Arm, Patient Position: Sitting, Cuff Size: Small)   Pulse 72   Temp (!) 96.1 F (35.6 C) (Temporal)   Resp 16   Wt 167 lb (75.8 kg)   SpO2 100%   BMI 23.96 kg/m    Objective:   Physical Exam Constitutional:      Appearance: She is well-developed.  Neck:     Thyroid: No thyromegaly.  Cardiovascular:     Rate and Rhythm: Normal rate and regular rhythm.     Heart sounds: Normal heart sounds. No murmur.  Pulmonary:     Effort: Pulmonary effort is normal. No respiratory distress.     Breath sounds: Normal breath sounds. No wheezing.  Musculoskeletal:     Cervical back: Neck supple.  Skin:    General: Skin is warm and dry.  Neurological:     Mental Status: She is alert and oriented to person, place, and time.  Psychiatric:        Behavior: Behavior normal.        Thought Content: Thought content normal.        Judgment: Judgment normal.           Assessment & Plan:  Hypertension-blood pressure is stable.  Basic metabolic panel is obtained today.  GERD-this is stable on PPI, continue same.  This visit occurred during the SARS-CoV-2 public health emergency.  Safety protocols were in place, including screening questions prior to the visit, additional usage of staff PPE, and extensive cleaning of exam room while observing appropriate contact time as indicated for disinfecting solutions.

## 2019-03-02 ENCOUNTER — Encounter: Payer: Self-pay | Admitting: Family Medicine

## 2019-03-02 ENCOUNTER — Ambulatory Visit (INDEPENDENT_AMBULATORY_CARE_PROVIDER_SITE_OTHER): Payer: Managed Care, Other (non HMO) | Admitting: Family Medicine

## 2019-03-02 ENCOUNTER — Other Ambulatory Visit: Payer: Self-pay

## 2019-03-02 DIAGNOSIS — M25511 Pain in right shoulder: Secondary | ICD-10-CM

## 2019-03-02 NOTE — Assessment & Plan Note (Signed)
Having improvement of the pain.  Still having some limitations with external rotation.  May develop an adhesive capsulitis. -Counseled on home exercise therapy and supportive care. -If no improvement will consider injection, imaging or physical therapy.

## 2019-03-02 NOTE — Progress Notes (Signed)
Lauren Walters - 59 y.o. female MRN [id number]  Date of birth: [birth year]  SUBJECTIVE:  Including CC & ROS.  Chief Complaint  Patient presents with  . Follow-up    follow up for right shoulder    Lauren Walters is a 59 y.o. female that is following up for her right shoulder pain.  She reports having improvement of the pain with use of the sling.  The sling did make her shoulder stiff when using it.  She has days where she does not notice the pain at all.  She has been careful with moving objects or carrying anything.  The pain is localized to the shoulder.  Is regaining her range of motion.   Review of Systems  See hpi   HISTORY: Past Medical, Surgical, Social, and Family History Reviewed & Updated per EMR.   Pertinent Historical Findings include:  Past Medical History:  Diagnosis Date  . Arthritis   . GERD (gastroesophageal reflux disease)   . History of chicken pox   . Hypertension   . Osteopenia     Past Surgical History:  Procedure Laterality Date  . CESAREAN SECTION  1995  . CESAREAN SECTION  1997  . CHOLECYSTECTOMY  1991  . JOINT REPLACEMENT     R hip  . OVARIAN CYST REMOVAL  1990  . ROOT CANAL    . TOTAL HIP ARTHROPLASTY  2011  . WISDOM TOOTH EXTRACTION      Allergies  Allergen Reactions  . Metoprolol Tartrate Other (See Comments)    Chest pain, increased palpitations    Family History  Problem Relation Age of Onset  . Alcohol abuse Father        deceased  . Cancer Father        prostate, basal cell   . Arthritis Father   . Heart disease Father   . Arthritis Paternal Grandfather   . Heart disease Paternal Grandfather   . Dementia Mother   . Hypertension Mother   . Heart disease Maternal Grandfather   . Colon cancer Neg Hx      Social History   Socioeconomic History  . Marital status: Married    Spouse name: Not on file  . Number of children: Not on file  . Years of education: Not on file  . Highest education level: Not on file    Occupational History  . Not on file  Tobacco Use  . Smoking status: Former Smoker    Quit date: 05/17/2015    Years since quitting: 3.7  . Smokeless tobacco: Never Used  Substance and Sexual Activity  . Alcohol use: Yes    Alcohol/week: 14.0 standard drinks    Types: 14 Glasses of wine per week    Comment: 2 glasses of wine a night  . Drug use: No  . Sexual activity: Not on file  Other Topics Concern  . 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 Difficulty of Paying Living Expenses: Not on file  Food Insecurity:   . Worried About Charity fundraiser in the Last Year: Not on file  . Ran Out of Food in the Last Year: Not on file  Transportation Needs:   . Lack of Transportation (Medical):  Not on file  . Lack of Transportation (Non-Medical): Not on file  Physical Activity:   . Days of Exercise per Week: Not on file  . Minutes of Exercise per Session: Not on file  Stress:   . Feeling of Stress : Not on file  Social Connections:   . Frequency of Communication with Friends and Family: Not on file  . Frequency of Social Gatherings with Friends and Family: Not on file  . Attends Religious Services: Not on file  . Active Member of Clubs or Organizations: Not on file  . Attends Archivist Meetings: Not on file  . Marital Status: Not on file  Intimate Partner Violence:   . Fear of Current or Ex-Partner: Not on file  . Emotionally Abused: Not on file  . Physically Abused: Not on file  . Sexually Abused: Not on file     PHYSICAL EXAM:  VS: BP (!) 144/73   Pulse 63   Ht 5\' 10"  (1.778 m)   Wt 170 lb (77.1 kg)   BMI 24.39 kg/m  Physical Exam Gen: NAD, alert, cooperative with exam, well-appearing ENT: normal lips, normal nasal mucosa,  Eye: normal EOM,  normal conjunctiva and lids CV:  no edema, +2 pedal pulses   Resp: no accessory muscle use, non-labored,  Skin: no rashes, no areas of induration  Neuro: normal tone, normal sensation to touch Psych:  normal insight, alert and oriented MSK:  Right shoulder:  Limited external rotation. Near normal abduction and flexion actively. Normal strength resistance. Neurovascularly intact     ASSESSMENT & PLAN:   Right shoulder pain Having improvement of the pain.  Still having some limitations with external rotation.  May develop an adhesive capsulitis. -Counseled on home exercise therapy and supportive care. -If no improvement will consider injection, imaging or physical therapy.

## 2019-03-02 NOTE — Patient Instructions (Signed)
Good to see you Please alternate heat and ice  Please try the exercises   Please send me a message in MyChart with any questions or updates.  Please see me back in 6-8 weeks.   --Dr. Raeford Razor

## 2019-03-04 ENCOUNTER — Other Ambulatory Visit: Payer: Self-pay | Admitting: Family

## 2019-03-05 ENCOUNTER — Other Ambulatory Visit: Payer: Self-pay | Admitting: Family

## 2019-03-23 ENCOUNTER — Other Ambulatory Visit: Payer: Self-pay | Admitting: Family

## 2019-04-13 ENCOUNTER — Encounter: Payer: Self-pay | Admitting: Family Medicine

## 2019-04-14 ENCOUNTER — Other Ambulatory Visit: Payer: Self-pay | Admitting: Family Medicine

## 2019-04-14 MED ORDER — IBUPROFEN 600 MG PO TABS: 600.0000 mg | ORAL_TABLET | Freq: Three times a day (TID) | ORAL | 0 refills | Status: DC | PRN

## 2019-04-20 ENCOUNTER — Ambulatory Visit (HOSPITAL_BASED_OUTPATIENT_CLINIC_OR_DEPARTMENT_OTHER)
Admission: RE | Admit: 2019-04-20 | Discharge: 2019-04-20 | Disposition: A | Discharge status: Home / Self Care | Payer: Managed Care, Other (non HMO) | Source: Ambulatory Visit | Attending: Family Medicine | Admitting: Family Medicine

## 2019-04-20 ENCOUNTER — Ambulatory Visit: Payer: Self-pay

## 2019-04-20 ENCOUNTER — Other Ambulatory Visit: Payer: Self-pay

## 2019-04-20 ENCOUNTER — Ambulatory Visit (INDEPENDENT_AMBULATORY_CARE_PROVIDER_SITE_OTHER): Payer: Managed Care, Other (non HMO) | Admitting: Family Medicine

## 2019-04-20 ENCOUNTER — Encounter: Payer: Self-pay | Admitting: Family Medicine

## 2019-04-20 VITALS — BP 121/76 | HR 59 | Ht 70.0 in | Wt 170.0 lb

## 2019-04-20 DIAGNOSIS — M7501 Adhesive capsulitis of right shoulder: Secondary | ICD-10-CM | POA: Diagnosis present

## 2019-04-20 MED ORDER — TRIAMCINOLONE ACETONIDE 40 MG/ML IJ SUSP
40.0000 mg | Freq: Once | INTRAMUSCULAR | Status: AC
  Administered 2019-04-20: 40 mg via INTRA_ARTICULAR

## 2019-04-20 NOTE — Progress Notes (Signed)
Lauren Walters - 59 y.o. female MRN [id number]  Date of birth: 02/27/1960  SUBJECTIVE:  Including CC & ROS.  Chief Complaint  Patient presents with  . Follow-up    follow up for right shoulder    Lauren Walters is a 59 y.o. female that is presenting with acute worsening of the right shoulder pain. She has tried home exercises and has seem to have had exacerbation of her underlying pain. It is worse with certain movements or reaching above. Pain is becoming more severe and constant.   Review of Systems See HPI   HISTORY: Past Medical, Surgical, Social, and Family History Reviewed & Updated per EMR.   Pertinent Historical Findings include:  Past Medical History:  Diagnosis Date  . Arthritis   . GERD (gastroesophageal reflux disease)   . History of chicken pox   . Hypertension   . Osteopenia     Past Surgical History:  Procedure Laterality Date  . CESAREAN SECTION  1995  . CESAREAN SECTION  1997  . CHOLECYSTECTOMY  1991  . JOINT REPLACEMENT     R hip  . OVARIAN CYST REMOVAL  1990  . ROOT CANAL    . TOTAL HIP ARTHROPLASTY  2011  . WISDOM TOOTH EXTRACTION      Family History  Problem Relation Age of Onset  . Alcohol abuse Father        deceased  . Cancer Father        prostate, basal cell   . Arthritis Father   . Heart disease Father   . Arthritis Paternal Grandfather   . Heart disease Paternal Grandfather   . Dementia Mother   . Hypertension Mother   . Heart disease Maternal Grandfather   . Colon cancer Neg Hx     Social History   Socioeconomic History  . Marital status: Married    Spouse name: Not on file  . Number of children: Not on file  . Years of education: Not on file  . Highest education level: Not on file  Occupational History  . Not on file  Tobacco Use  . Smoking status: Former Smoker    Quit date: 05/17/2015    Years since quitting: 3.9  . Smokeless tobacco: Never Used  Substance and Sexual Activity  . Alcohol use: Yes   Alcohol/week: 14.0 standard drinks    Types: 14 Glasses of wine per week    Comment: 2 glasses of wine a night  . Drug use: No  . Sexual activity: Not on file  Other Topics Concern  . 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 Difficulty of Paying Living Expenses: Not on file  Food Insecurity:   . Worried About Charity fundraiser in the Last Year: Not on file  . Ran Out of Food in the Last Year: Not on file  Transportation Needs:   . Lack of Transportation (Medical): Not on file  . Lack of Transportation (Non-Medical): Not on file  Physical Activity:   . Days of Exercise per Week: Not on file  . Minutes of Exercise per Session: Not on file  Stress:   . Feeling of Stress : Not on file  Social Connections:   . Frequency  of Communication with Friends and Family: Not on file  . Frequency of Social Gatherings with Friends and Family: Not on file  . Attends Religious Services: Not on file  . Active Member of Clubs or Organizations: Not on file  . Attends Archivist Meetings: Not on file  . Marital Status: Not on file  Intimate Partner Violence:   . Fear of Current or Ex-Partner: Not on file  . Emotionally Abused: Not on file  . Physically Abused: Not on file  . Sexually Abused: Not on file     PHYSICAL EXAM:  VS: BP 121/76   Pulse (!) 59   Ht 5\' 10"  (1.778 m)   Wt 170 lb (77.1 kg)   BMI 24.39 kg/m  Physical Exam Gen: NAD, alert, cooperative with exam, well-appearing MSK:  Right shoulder: Limited external rotation. Limited flexion and abduction. Normal strength resistance. Normal empty can test. Neurovascular intact   Aspiration/Injection Procedure Note Lauren Walters [birth year]  Procedure: Injection Indications: Right shoulder  pain  Procedure Details Consent: Risks of procedure as well as the alternatives and risks of each were explained to the (patient/caregiver).  Consent for procedure obtained. Time Out: Verified patient identification, verified procedure, site/side was marked, verified correct patient position, special equipment/implants available, medications/allergies/relevent history reviewed, required imaging and test results available.  Performed.  The area was cleaned with iodine and alcohol swabs.    The right posterior glenohumeral joint was injected using 1 cc's of 40 mg Kenalog, 3 cc of 1% lidocaine, and 3 cc's of 0.25% bupivacaine with a 21 2" needle.  Ultrasound was used. Images were obtained in short views showing the injection.     A sterile dressing was applied.  Patient did tolerate procedure well.    ASSESSMENT & PLAN:   Adhesive capsulitis of right shoulder It seems that she has progressed to more of a frozen shoulder. -Injection. -Counseled on home exercise therapy and supportive care. -X-ray. -Could consider physical therapy or Toradol injection.

## 2019-04-20 NOTE — Assessment & Plan Note (Signed)
It seems that she has progressed to more of a frozen shoulder. -Injection. -Counseled on home exercise therapy and supportive care. -X-ray. -Could consider physical therapy or Toradol injection.

## 2019-04-20 NOTE — Patient Instructions (Signed)
Good to see you Please try heat before the exercises and ice after Please try the range of motion movements  I will call with the results.   Please send me a message in MyChart with any questions or updates.  Please see me back in 4-6 weeks.   --Dr. Raeford Razor

## 2019-04-21 ENCOUNTER — Telehealth: Payer: Self-pay | Admitting: Family Medicine

## 2019-04-21 ENCOUNTER — Other Ambulatory Visit: Payer: Self-pay | Admitting: Family Medicine

## 2019-04-21 NOTE — Telephone Encounter (Signed)
Left VM for patient. If she calls back please have her speak with a nurse/CMA and inform that her xray is showing some mild degenerative changes of the joint. Otherwise it looks good.   If any questions then please take the best time and phone number to call and I will try to call her back.   Rosemarie Ax, MD [facility] 04/21/2019, 8:41 AM

## 2019-04-22 ENCOUNTER — Encounter: Payer: Self-pay | Admitting: Family Medicine

## 2019-05-18 ENCOUNTER — Encounter: Payer: Self-pay | Admitting: Family

## 2019-05-25 ENCOUNTER — Encounter: Payer: Self-pay | Admitting: Family Medicine

## 2019-05-25 ENCOUNTER — Other Ambulatory Visit: Payer: Self-pay

## 2019-05-25 ENCOUNTER — Ambulatory Visit (INDEPENDENT_AMBULATORY_CARE_PROVIDER_SITE_OTHER): Payer: PRIVATE HEALTH INSURANCE | Admitting: Family Medicine

## 2019-05-25 DIAGNOSIS — M7501 Adhesive capsulitis of right shoulder: Secondary | ICD-10-CM

## 2019-05-25 NOTE — Assessment & Plan Note (Signed)
Improving pain and range of motion.  - counseled on HEP and supportive care - if ongoing could consider PT or MRi  - could repeat injection

## 2019-05-25 NOTE — Progress Notes (Signed)
Lauren Walters - 59 y.o. female MRN [id number]  Date of birth: [birth year]  SUBJECTIVE:  Including CC & ROS.  Chief Complaint  Patient presents with  . Follow-up    follow up for right shoulder    Lauren Walters is a 59 y.o. female that is following up for her right shoulder frozen shoulder. She received an injection and her pain and range of motion has improved. Still having some limited range of motion. Has been more active.    Review of Systems See HPI   HISTORY: Past Medical, Surgical, Social, and Family History Reviewed & Updated per EMR.   Pertinent Historical Findings include:  Past Medical History:  Diagnosis Date  . Arthritis   . GERD (gastroesophageal reflux disease)   . History of chicken pox   . Hypertension   . Osteopenia     Past Surgical History:  Procedure Laterality Date  . CESAREAN SECTION  1995  . CESAREAN SECTION  1997  . CHOLECYSTECTOMY  1991  . JOINT REPLACEMENT     R hip  . OVARIAN CYST REMOVAL  1990  . ROOT CANAL    . TOTAL HIP ARTHROPLASTY  2011  . WISDOM TOOTH EXTRACTION      Family History  Problem Relation Age of Onset  . Alcohol abuse Father        deceased  . Cancer Father        prostate, basal cell   . Arthritis Father   . Heart disease Father   . Arthritis Paternal Grandfather   . Heart disease Paternal Grandfather   . Dementia Mother   . Hypertension Mother   . Heart disease Maternal Grandfather   . Colon cancer Neg Hx     Social History   Socioeconomic History  . Marital status: Married    Spouse name: Not on file  . Number of children: Not on file  . Years of education: Not on file  . Highest education level: Not on file  Occupational History  . Not on file  Tobacco Use  . Smoking status: Former Smoker    Quit date: 05/17/2015    Years since quitting: 4.0  . Smokeless tobacco: Never Used  Substance and Sexual Activity  . Alcohol use: Yes    Alcohol/week: 14.0 standard drinks    Types: 14 Glasses of  wine per week    Comment: 2 glasses of wine a night  . Drug use: No  . Sexual activity: Not on file  Other Topics Concern  . 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 Difficulty of Paying Living Expenses:   Food Insecurity:   . Worried About Charity fundraiser in the Last Year:   . Arboriculturist in the Last Year:   Transportation Needs:   . Film/video editor (Medical):   Marland Kitchen Lack of Transportation (Non-Medical):   Physical Activity:   . Days of Exercise per Week:   . Minutes of Exercise per Session:   Stress:   . Feeling of Stress :   Social Connections:   . Frequency of Communication with Friends and Family:   . Frequency of Social Gatherings with Friends and Family:   . Attends Religious  Services:   . Active Member of Clubs or Organizations:   . Attends Archivist Meetings:   Marland Kitchen Marital Status:   Intimate Partner Violence:   . Fear of Current or Ex-Partner:   . Emotionally Abused:   Marland Kitchen Physically Abused:   . Sexually Abused:      PHYSICAL EXAM:  VS: BP 131/76   Pulse 67   Ht 5\' 10"  (1.778 m)   Wt 166 lb (75.3 kg)   BMI 23.82 kg/m  Physical Exam Gen: NAD, alert, cooperative with exam, well-appearing MSK:  Right shoulder:  Limited ER but improved from previous  Improving abduction  Normal IR and ER strength to resistance  NVI      ASSESSMENT & PLAN:   Adhesive capsulitis of right shoulder Improving pain and range of motion.  - counseled on HEP and supportive care - if ongoing could consider PT or MRi  - could repeat injection

## 2019-07-01 ENCOUNTER — Other Ambulatory Visit: Payer: Self-pay | Admitting: Family

## 2019-07-17 ENCOUNTER — Ambulatory Visit (INDEPENDENT_AMBULATORY_CARE_PROVIDER_SITE_OTHER): Payer: PRIVATE HEALTH INSURANCE | Admitting: Family

## 2019-07-17 ENCOUNTER — Encounter: Payer: Self-pay | Admitting: Family

## 2019-07-17 ENCOUNTER — Other Ambulatory Visit: Payer: Self-pay

## 2019-07-17 VITALS — BP 126/69 | HR 78 | Temp 97.8°F | Resp 16 | Ht 70.0 in | Wt 168.0 lb

## 2019-07-17 DIAGNOSIS — R350 Frequency of micturition: Secondary | ICD-10-CM | POA: Diagnosis not present

## 2019-07-17 DIAGNOSIS — R319 Hematuria, unspecified: Secondary | ICD-10-CM

## 2019-07-17 DIAGNOSIS — R310 Gross hematuria: Secondary | ICD-10-CM | POA: Diagnosis not present

## 2019-07-17 LAB — URINALYSIS, ROUTINE W REFLEX MICROSCOPIC
Bilirubin Urine: NEGATIVE
Ketones, ur: NEGATIVE
Nitrite: NEGATIVE
Specific Gravity, Urine: 1.015 (ref 1.000–1.030)
Total Protein, Urine: 300 — AB
Urine Glucose: NEGATIVE
Urobilinogen, UA: 0.2 — AB (ref 0.0–1.0)
pH: 6.5 (ref 5.0–8.0)

## 2019-07-17 LAB — POC URINALSYSI DIPSTICK (AUTOMATED)
Bilirubin, UA: NEGATIVE
Glucose, UA: NEGATIVE
Ketones, UA: NEGATIVE
Nitrite, UA: NEGATIVE
Protein, UA: POSITIVE — AB
Spec Grav, UA: 1.015 (ref 1.010–1.025)
Urobilinogen, UA: NEGATIVE E.U./dL — AB
pH, UA: 6.5 (ref 5.0–8.0)

## 2019-07-17 MED ORDER — CEPHALEXIN 500 MG PO CAPS: 500.0000 mg | ORAL_CAPSULE | Freq: Three times a day (TID) | ORAL | 0 refills | Status: DC

## 2019-07-17 NOTE — Progress Notes (Signed)
Subjective:    Patient ID: Lauren Walters, female    DOB: 1960/10/22, 59 y.o.   MRN: [id number]  HPI  Patient is a 59 yr old female who presents today with c/o urinary frequency and gross hematuria. Woke up to urinate last night.  Denies hx of stones, denies flank pain or bladder pain.    Review of Systems See HPI  Past Medical History:  Diagnosis Date  . Arthritis   . GERD (gastroesophageal reflux disease)   . History of chicken pox   . Hypertension   . Osteopenia      Social History   Socioeconomic History  . Marital status: Married    Spouse name: Not on file  . Number of children: Not on file  . Years of education: Not on file  . Highest education level: Not on file  Occupational History  . Not on file  Tobacco Use  . Smoking status: Former Smoker    Quit date: 05/17/2015    Years since quitting: 4.1  . Smokeless tobacco: Never Used  Substance and Sexual Activity  . Alcohol use: Yes    Alcohol/week: 14.0 standard drinks    Types: 14 Glasses of wine per week    Comment: 2 glasses of wine a night  . Drug use: No  . Sexual activity: Not on file  Other Topics Concern  . 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 Difficulty of Paying Living Expenses:   Food Insecurity:   . Worried About Charity fundraiser in the Last Year:   . Arboriculturist in the Last Year:   Transportation Needs:   . Film/video editor (Medical):   Marland Kitchen Lack of Transportation (Non-Medical):   Physical Activity:   . Days of Exercise per Week:   . Minutes of Exercise per Session:   Stress:   . Feeling of Stress :   Social Connections:   . Frequency of Communication with Friends and Family:   . Frequency of Social Gatherings with Friends and Family:   . Attends  Religious Services:   . Active Member of Clubs or Organizations:   . Attends Archivist Meetings:   Marland Kitchen Marital Status:   Intimate Partner Violence:   . Fear of Current or Ex-Partner:   . Emotionally Abused:   Marland Kitchen Physically Abused:   . Sexually Abused:     Past Surgical History:  Procedure Laterality Date  . CESAREAN SECTION  1995  . CESAREAN SECTION  1997  . CHOLECYSTECTOMY  1991  . JOINT REPLACEMENT     R hip  . OVARIAN CYST REMOVAL  1990  . ROOT CANAL    . TOTAL HIP ARTHROPLASTY  2011  . WISDOM TOOTH EXTRACTION      Family History  Problem Relation Age of Onset  . Alcohol abuse Father        deceased  . Cancer Father        prostate, basal cell   . Arthritis Father   . Heart disease Father   . Arthritis Paternal Grandfather   . Heart disease Paternal Grandfather   . Dementia Mother   . Hypertension Mother   . Heart disease Maternal Grandfather   .  Colon cancer Neg Hx     Allergies  Allergen Reactions  . Metoprolol Tartrate Other (See Comments)    Chest pain, increased palpitations    Current Outpatient Medications on File Prior to Visit  Medication Sig Dispense Refill  . Calcium Carbonate-Vitamin D 600-400 MG-UNIT chew tablet Chew 1 tablet by mouth 2 (two) times daily.    . cetirizine (ZYRTEC) 10 MG tablet Take 1 tablet (10 mg total) by mouth daily. 30 tablet 11  . estradiol (ESTRING) 2 MG vaginal ring PLACE 1 RING VAGINALLY EVERY 3 MONTHS AS DIRECTED 1 each 3  . hydrochlorothiazide (HYDRODIURIL) 25 MG tablet TAKE ONE TABLET BY MOUTH DAILY 90 tablet 1  . ibuprofen (ADVIL) 600 MG tablet Take 1 tablet (600 mg total) by mouth every 8 (eight) hours as needed. 30 tablet 0  . lisinopril (ZESTRIL) 5 MG tablet TAKE ONE TABLET BY MOUTH DAILY 90 tablet 1  . omeprazole (PRILOSEC) 40 MG capsule TAKE ONE CAPSULE BY MOUTH DAILY 90 capsule 1   No current facility-administered medications on file prior to visit.    BP 126/69 (BP Location: Right Arm, Patient  Position: Sitting, Cuff Size: Small)   Pulse 78   Temp 97.8 F (36.6 C) (Temporal)   Resp 16   Ht 5\' 10"  (1.778 m)   Wt 168 lb (76.2 kg)   SpO2 100%   BMI 24.11 kg/m       Objective:   Physical Exam Constitutional:      Appearance: She is well-developed.  Cardiovascular:     Rate and Rhythm: Normal rate and regular rhythm.     Heart sounds: Normal heart sounds. No murmur.  Pulmonary:     Effort: Pulmonary effort is normal. No respiratory distress.     Breath sounds: Normal breath sounds. No wheezing.  Abdominal:     General: There is no distension.     Palpations: Abdomen is soft.     Tenderness: There is no right CVA tenderness or left CVA tenderness.  Psychiatric:        Behavior: Behavior normal.        Thought Content: Thought content normal.        Judgment: Judgment normal.           Assessment & Plan:  Hematuria- associated dysuria. + moderate leuks on UA.  No flank pain or abdominal tenderness.  Will send urine for culture, start keflex empirically. Plan repeat urinalysis with micro in 2 weeks. If hematuria persists, plan referral to urology for further evaluation- especially given her hx of tobacco abuse and increased risk for bladder cancer. She will call if symptoms worsen or if symptoms fail to improve.   This visit occurred during the SARS-CoV-2 public health emergency.  Safety protocols were in place, including screening questions prior to the visit, additional usage of staff PPE, and extensive cleaning of exam room while observing appropriate contact time as indicated for disinfecting solutions.

## 2019-07-17 NOTE — Patient Instructions (Signed)
Please start keflex.

## 2019-07-19 LAB — URINE CULTURE
MICRO NUMBER:: 10505974
SPECIMEN QUALITY:: ADEQUATE

## 2019-07-20 ENCOUNTER — Telehealth: Payer: Self-pay

## 2019-07-20 NOTE — Telephone Encounter (Signed)
After Hours call:   Caller states she would like the office to call her.

## 2019-07-21 ENCOUNTER — Encounter: Payer: Self-pay | Admitting: Family

## 2019-07-21 NOTE — Telephone Encounter (Signed)
Patient was seen friday

## 2019-08-03 ENCOUNTER — Encounter: Payer: Self-pay | Admitting: Family

## 2019-08-03 ENCOUNTER — Other Ambulatory Visit: Payer: Self-pay

## 2019-08-03 ENCOUNTER — Other Ambulatory Visit (INDEPENDENT_AMBULATORY_CARE_PROVIDER_SITE_OTHER): Payer: PRIVATE HEALTH INSURANCE

## 2019-08-03 ENCOUNTER — Telehealth: Payer: Self-pay | Admitting: Family

## 2019-08-03 DIAGNOSIS — R300 Dysuria: Secondary | ICD-10-CM

## 2019-08-03 LAB — URINALYSIS, ROUTINE W REFLEX MICROSCOPIC
Bilirubin Urine: NEGATIVE
Ketones, ur: NEGATIVE
Nitrite: NEGATIVE
Specific Gravity, Urine: <=1.005 — AB (ref 1.000–1.030)
Total Protein, Urine: NEGATIVE
Urine Glucose: NEGATIVE
Urobilinogen, UA: 0.2 (ref 0.0–1.0)
pH: 6 (ref 5.0–8.0)

## 2019-08-03 MED ORDER — NITROFURANTOIN MONOHYD MACRO 100 MG PO CAPS
100.0000 mg | ORAL_CAPSULE | Freq: Two times a day (BID) | ORAL | 0 refills | Status: DC
Start: 2019-08-03 — End: 2019-09-18

## 2019-08-03 NOTE — Telephone Encounter (Signed)
Melissa -- There aren't any future urine orders in Epic that I could find.  Can you place appropriate future orders for today's lab visit?

## 2019-08-03 NOTE — Telephone Encounter (Signed)
Please contact pt and let her know that I reviewed her urinalysis and it suggests UTI. I would recommend that she start macrobid while we wait on the culture.

## 2019-08-04 ENCOUNTER — Encounter: Payer: Self-pay | Admitting: Family

## 2019-08-04 NOTE — Telephone Encounter (Signed)
Patient advised of results and to pick up medication

## 2019-08-05 LAB — URINE CULTURE
MICRO NUMBER:: 10560511
SPECIMEN QUALITY:: ADEQUATE

## 2019-08-18 ENCOUNTER — Other Ambulatory Visit: Payer: Self-pay | Admitting: Family

## 2019-08-18 ENCOUNTER — Telehealth: Payer: Self-pay

## 2019-08-18 MED ORDER — ESTRADIOL 0.1 MG/GM VA CREA
TOPICAL_CREAM | VAGINAL | 12 refills | Status: DC
Start: 2019-08-18 — End: 2019-09-18

## 2019-08-18 NOTE — Telephone Encounter (Signed)
-

## 2019-08-18 NOTE — Telephone Encounter (Signed)
estradiol (ESTRACE VAGINAL) 0.1 MG/GM vaginal cream [[id number]]    Per patient's pharmacy medication sent over directed to take in MG , usually  This edication is directed to take in grams. Please Advise   Lauren Walters [facility] [address]  [place], [place] [address]  Phone:  [phone] Fax:  [phone]  DEA #:  --

## 2019-08-18 NOTE — Telephone Encounter (Signed)
Estradiol vaginal ring no longer covered by insurance. Preferred is cream.

## 2019-08-18 NOTE — Telephone Encounter (Signed)
Please advise pt that her insurance is not covering estradiol ring but will cover cream. She should d/c ring after 3 months and then transition to the cream.  I am sending cream to her pharmacy instead.

## 2019-09-18 ENCOUNTER — Other Ambulatory Visit: Payer: Self-pay

## 2019-09-18 ENCOUNTER — Other Ambulatory Visit (HOSPITAL_COMMUNITY)
Admission: RE | Admit: 2019-09-18 | Discharge: 2019-09-18 | Disposition: A | Discharge status: Home / Self Care | Payer: PRIVATE HEALTH INSURANCE | Source: Ambulatory Visit | Attending: Family | Admitting: Family

## 2019-09-18 ENCOUNTER — Ambulatory Visit (INDEPENDENT_AMBULATORY_CARE_PROVIDER_SITE_OTHER): Payer: PRIVATE HEALTH INSURANCE | Admitting: Family

## 2019-09-18 ENCOUNTER — Encounter: Payer: Self-pay | Admitting: Family

## 2019-09-18 VITALS — BP 132/80 | HR 69 | Resp 16 | Ht 70.0 in | Wt 173.0 lb

## 2019-09-18 DIAGNOSIS — Z0000 Encounter for general adult medical examination without abnormal findings: Secondary | ICD-10-CM | POA: Insufficient documentation

## 2019-09-18 LAB — BASIC METABOLIC PANEL
BUN: 13 mg/dL (ref 6–23)
CO2: 33 mEq/L — ABNORMAL HIGH (ref 19–32)
Calcium: 9.9 mg/dL (ref 8.4–10.5)
Chloride: 100 mEq/L (ref 96–112)
Creatinine, Ser: 0.68 mg/dL (ref 0.40–1.20)
GFR: 87.95 mL/min (ref >60.00)
Glucose, Bld: 89 mg/dL (ref 70–99)
Potassium: 4.2 mEq/L (ref 3.5–5.1)
Sodium: 140 mEq/L (ref 135–145)

## 2019-09-18 LAB — CBC WITH DIFFERENTIAL/PLATELET
Basophils Absolute: 0.1 10*3/uL (ref 0.0–0.1)
Basophils Relative: 1 % (ref 0.0–3.0)
Eosinophils Absolute: 0.2 10*3/uL (ref 0.0–0.7)
Eosinophils Relative: 3.3 % (ref 0.0–5.0)
HCT: 40.2 % (ref 36.0–46.0)
Hemoglobin: 13.6 g/dL (ref 12.0–15.0)
Lymphocytes Relative: 25.5 % (ref 12.0–46.0)
Lymphs Abs: 1.5 10*3/uL (ref 0.7–4.0)
MCHC: 33.8 g/dL (ref 30.0–36.0)
MCV: 94.1 fl (ref 78.0–100.0)
Monocytes Absolute: 0.5 10*3/uL (ref 0.1–1.0)
Monocytes Relative: 8.5 % (ref 3.0–12.0)
Neutro Abs: 3.6 10*3/uL (ref 1.4–7.7)
Neutrophils Relative %: 61.7 % (ref 43.0–77.0)
Platelets: 264 10*3/uL (ref 150.0–400.0)
RBC: 4.27 Mil/uL (ref 3.87–5.11)
RDW: 13.6 % (ref 11.5–15.5)
WBC: 5.9 10*3/uL (ref 4.0–10.5)

## 2019-09-18 LAB — HEPATIC FUNCTION PANEL
ALT: 16 U/L (ref 0–35)
AST: 23 U/L (ref 0–37)
Albumin: 4.7 g/dL (ref 3.5–5.2)
Alkaline Phosphatase: 48 U/L (ref 39–117)
Bilirubin, Direct: 0.1 mg/dL (ref 0.0–0.3)
Total Bilirubin: 0.5 mg/dL (ref 0.2–1.2)
Total Protein: 7 g/dL (ref 6.0–8.3)

## 2019-09-18 LAB — LIPID PANEL
Cholesterol: 229 mg/dL — ABNORMAL HIGH (ref 0–200)
HDL: 108 mg/dL (ref >39.00)
LDL Cholesterol: 109 mg/dL — ABNORMAL HIGH (ref 0–99)
NonHDL: 120.84
Total CHOL/HDL Ratio: 2
Triglycerides: 61 mg/dL (ref 0.0–149.0)
VLDL: 12.2 mg/dL (ref 0.0–40.0)

## 2019-09-18 LAB — TSH: TSH: 2.2 u[IU]/mL (ref 0.35–4.50)

## 2019-09-18 MED ORDER — BETAMETHASONE VALERATE 0.1 % EX OINT
1.0000 | TOPICAL_OINTMENT | Freq: Two times a day (BID) | CUTANEOUS | 0 refills | Status: DC
Start: 2019-09-18 — End: 2022-08-17

## 2019-09-18 NOTE — Patient Instructions (Addendum)
Please complete lab work prior to leaving. Continue healthy diet and regular exercise. Schedule mammogram on the first floor.  Schedule a routine eye exam. You can try betamethasone cream twice daily to the itchy spot on your arm.

## 2019-09-18 NOTE — Progress Notes (Signed)
Subjective:    Patient ID: Lauren Walters, female    DOB: 1960-04-04, 59 y.o.   MRN: [id number]  HPI  Patient presents today for complete physical.  Immunizations: tdap 2020, completed shingrix and pfizer Diet:healthy Wt Readings from Last 3 Encounters:  09/18/19 173 lb (78.5 kg)  07/17/19 168 lb (76.2 kg)  05/25/19 166 lb (75.3 kg)  Exercise: walks, rides her bike Colonoscopy: 02/01/16- due 2022 Dexa: 09/01/18 Pap Smear: 2018- due Mammogram: 09/01/18 Vision: due Dental: up to date   Review of Systems  Constitutional: Negative for unexpected weight change.  HENT: Negative for hearing loss and rhinorrhea.   Eyes: Negative for visual disturbance.  Respiratory: Negative for cough and shortness of breath.   Cardiovascular: Negative for chest pain.  Gastrointestinal: Negative for blood in stool, constipation and diarrhea.  Genitourinary: Negative for frequency and hematuria.  Musculoskeletal: Negative for arthralgias and myalgias.  Skin: Negative for rash.  Neurological: Negative for headaches.  Hematological: Negative for adenopathy.  Psychiatric/Behavioral:       Denies depression/anxiety   Past Medical History:  Diagnosis Date  . Arthritis   . GERD (gastroesophageal reflux disease)   . History of chicken pox   . Hypertension   . Osteopenia      Social History   Socioeconomic History  . Marital status: Married    Spouse name: Not on file  . Number of children: Not on file  . Years of education: Not on file  . Highest education level: Not on file  Occupational History  . Not on file  Tobacco Use  . Smoking status: Former Smoker    Quit date: 05/17/2015    Years since quitting: 4.3  . Smokeless tobacco: Never Used  Substance and Sexual Activity  . Alcohol use: Yes    Alcohol/week: 14.0 standard drinks    Types: 14 Glasses of wine per week    Comment: 2 glasses of wine a night  . Drug use: No  . Sexual activity: Not on file  Other Topics Concern  . 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 Difficulty of Paying Living Expenses:   Food Insecurity:   . Worried About Charity fundraiser in the Last Year:   . Arboriculturist in the Last Year:   Transportation Needs:   . Film/video editor (Medical):   Marland Kitchen Lack of Transportation (Non-Medical):   Physical Activity:   . Days of Exercise per Week:   . Minutes of Exercise per Session:   Stress:   . Feeling of Stress :   Social Connections:   . Frequency of Communication with Friends and Family:   . Frequency of Social Gatherings with Friends and Family:   . Attends Religious Services:   . Active Member of Clubs or Organizations:   . Attends Archivist Meetings:   Marland Kitchen Marital Status:   Intimate Partner Violence:   . Fear of Current or Ex-Partner:   . Emotionally Abused:   Marland Kitchen Physically Abused:   . Sexually Abused:     Past Surgical History:  Procedure Laterality Date  . CESAREAN SECTION  1995  . CESAREAN SECTION  1997  . CHOLECYSTECTOMY  1991  . JOINT  REPLACEMENT     R hip  . OVARIAN CYST REMOVAL  1990  . ROOT CANAL    . TOTAL HIP ARTHROPLASTY  2011  . WISDOM TOOTH EXTRACTION      Family History  Problem Relation Age of Onset  . Alcohol abuse Father        deceased  . Cancer Father        prostate, basal cell   . Arthritis Father   . Heart disease Father   . Arthritis Paternal Grandfather   . Heart disease Paternal Grandfather   . Dementia Mother   . Hypertension Mother   . CVA Mother   . Heart disease Maternal Grandfather   . Colon cancer Neg Hx     Allergies  Allergen Reactions  . Metoprolol Tartrate Other (See Comments)    Chest pain, increased palpitations    Current Outpatient Medications on File Prior to Visit  Medication Sig  Dispense Refill  . Calcium Carbonate-Vitamin D 600-400 MG-UNIT chew tablet Chew 1 tablet by mouth 2 (two) times daily.    . cetirizine (ZYRTEC) 10 MG tablet Take 1 tablet (10 mg total) by mouth daily. 30 tablet 11  . hydrochlorothiazide (HYDRODIURIL) 25 MG tablet TAKE ONE TABLET BY MOUTH DAILY 90 tablet 1  . ibuprofen (ADVIL) 600 MG tablet Take 1 tablet (600 mg total) by mouth every 8 (eight) hours as needed. 30 tablet 0  . lisinopril (ZESTRIL) 5 MG tablet TAKE ONE TABLET BY MOUTH DAILY 90 tablet 1  . omeprazole (PRILOSEC) 40 MG capsule TAKE ONE CAPSULE BY MOUTH DAILY 90 capsule 1   No current facility-administered medications on file prior to visit.    BP (!) 132/80 (BP Location: Right Arm, Patient Position: Sitting, Cuff Size: Normal)   Pulse 69   Resp 16   Ht 5\' 10"  (1.778 m)   Wt 173 lb (78.5 kg)   SpO2 98%   BMI 24.82 kg/m       Objective:   Physical Exam  Physical Exam  Constitutional: She is oriented to person, place, and time. She appears well-developed and well-nourished. No distress.  HENT:  Head: Normocephalic and atraumatic.  Right Ear: Tympanic membrane and ear canal normal.  Left Ear: Tympanic membrane and ear canal normal.  Mouth/Throat: Not examined- pt wearing mask Eyes: Pupils are equal, round, and reactive to light. No scleral icterus.  Neck: Normal range of motion. No thyromegaly present.  Cardiovascular: Normal rate and regular rhythm.   No murmur heard. Pulmonary/Chest: Effort normal and breath sounds normal. No respiratory distress. He has no wheezes. She has no rales. She exhibits no tenderness.  Abdominal: Soft. Bowel sounds are normal. She exhibits no distension and no mass. There is no tenderness. There is no rebound and no guarding.  Musculoskeletal: She exhibits no edema. + soft lipoma left upper anterior arm Lymphadenopathy:    She has no cervical adenopathy.  Neurological: She is alert and oriented to person, place, and time. She has diminished  patellar reflexes. She exhibits normal muscle tone. Coordination normal.  Skin: Skin is warm and dry.  Psychiatric: She has a normal mood and affect. Her behavior is normal. Judgment and thought content normal. Inguinal/mons: Normal without inguinal adenopathy  External genitalia: Normal  BUS/Urethra/Skene's glands: Normal  Bladder: Normal  Vagina: Normal  Cervix: Normal- nabothian cyst noted Uterus: normal in size, shape and contour. Midline and mobile  Adnexa/parametria:  Rt: Without masses or tenderness.  Lt: Without masses or tenderness.  Anus and  perineum: Normal            Assessment & Plan:   Preventative care- discussed healthy diet, exercise. Obtain routine lab work.  Immunizations reviewed and up to date. Pap performed today with chaperone.  This visit occurred during the SARS-CoV-2 public health emergency.  Safety protocols were in place, including screening questions prior to the visit, additional usage of staff PPE, and extensive cleaning of exam room while observing appropriate contact time as indicated for disinfecting solutions.         Assessment & Plan:

## 2019-09-21 LAB — CYTOLOGY - PAP: Diagnosis: NEGATIVE

## 2019-10-06 ENCOUNTER — Encounter (HOSPITAL_BASED_OUTPATIENT_CLINIC_OR_DEPARTMENT_OTHER): Payer: Self-pay

## 2019-10-06 ENCOUNTER — Ambulatory Visit (HOSPITAL_BASED_OUTPATIENT_CLINIC_OR_DEPARTMENT_OTHER)
Admission: RE | Admit: 2019-10-06 | Discharge: 2019-10-06 | Disposition: A | Discharge status: Home / Self Care | Payer: PRIVATE HEALTH INSURANCE | Source: Ambulatory Visit | Attending: Family | Admitting: Family

## 2019-10-06 ENCOUNTER — Other Ambulatory Visit: Payer: Self-pay

## 2019-10-06 DIAGNOSIS — Z0000 Encounter for general adult medical examination without abnormal findings: Secondary | ICD-10-CM

## 2019-10-06 DIAGNOSIS — Z1231 Encounter for screening mammogram for malignant neoplasm of breast: Secondary | ICD-10-CM | POA: Diagnosis present

## 2019-10-25 ENCOUNTER — Encounter: Payer: Self-pay | Admitting: Family

## 2019-10-26 ENCOUNTER — Other Ambulatory Visit: Payer: Self-pay | Admitting: Family

## 2019-10-26 MED ORDER — AMOXICILLIN 500 MG PO CAPS
ORAL_CAPSULE | ORAL | 5 refills | Status: DC
Start: 2019-10-26 — End: 2021-07-10

## 2019-11-03 ENCOUNTER — Encounter: Payer: Self-pay | Admitting: Family

## 2019-11-12 ENCOUNTER — Encounter: Payer: Self-pay | Admitting: Family

## 2019-11-13 ENCOUNTER — Other Ambulatory Visit: Payer: Self-pay

## 2019-11-13 ENCOUNTER — Other Ambulatory Visit: Payer: PRIVATE HEALTH INSURANCE

## 2019-11-13 DIAGNOSIS — Z20822 Contact with and (suspected) exposure to covid-19: Secondary | ICD-10-CM

## 2019-11-16 LAB — NOVEL CORONAVIRUS, NAA: SARS-CoV-2, NAA: NOT DETECTED

## 2019-12-10 ENCOUNTER — Ambulatory Visit (INDEPENDENT_AMBULATORY_CARE_PROVIDER_SITE_OTHER): Payer: PRIVATE HEALTH INSURANCE

## 2019-12-10 ENCOUNTER — Other Ambulatory Visit: Payer: Self-pay

## 2019-12-10 DIAGNOSIS — Z23 Encounter for immunization: Secondary | ICD-10-CM

## 2019-12-22 ENCOUNTER — Ambulatory Visit: Payer: PRIVATE HEALTH INSURANCE | Attending: Internal Medicine

## 2019-12-22 DIAGNOSIS — Z23 Encounter for immunization: Secondary | ICD-10-CM

## 2019-12-22 NOTE — Progress Notes (Signed)
° °  Covid-19 Vaccination Clinic  Name:  [name]    MRN: [id number] DOB: 09-17-1960  12/22/2019  Ms. Matsuura was observed post Covid-19 immunization for 15 minutes without incident. She was provided with Vaccine Information Sheet and instruction to access the V-Safe system.   Ms. Welge was instructed to call 911 with any severe reactions post vaccine:  Difficulty breathing   Swelling of face and throat   A fast heartbeat   A bad rash all over body   Dizziness and weakness

## 2019-12-30 ENCOUNTER — Other Ambulatory Visit: Payer: Self-pay | Admitting: Family

## 2020-02-26 ENCOUNTER — Other Ambulatory Visit: Payer: Self-pay | Admitting: Family

## 2020-03-22 ENCOUNTER — Other Ambulatory Visit: Payer: Self-pay

## 2020-03-22 ENCOUNTER — Telehealth (INDEPENDENT_AMBULATORY_CARE_PROVIDER_SITE_OTHER): Payer: PRIVATE HEALTH INSURANCE | Admitting: Family

## 2020-03-22 ENCOUNTER — Telehealth: Payer: Self-pay | Admitting: Family

## 2020-03-22 ENCOUNTER — Encounter: Payer: Self-pay | Admitting: Family

## 2020-03-22 DIAGNOSIS — K219 Gastro-esophageal reflux disease without esophagitis: Secondary | ICD-10-CM

## 2020-03-22 DIAGNOSIS — M8580 Other specified disorders of bone density and structure, unspecified site: Secondary | ICD-10-CM

## 2020-03-22 DIAGNOSIS — I10 Essential (primary) hypertension: Secondary | ICD-10-CM | POA: Diagnosis not present

## 2020-03-22 NOTE — Telephone Encounter (Signed)
Please order labs as future, thanks.

## 2020-03-22 NOTE — Telephone Encounter (Signed)
Patient was scheduled for labs and bp check tomorrow

## 2020-03-22 NOTE — Progress Notes (Signed)
Virtual Visit via Video Note  I connected with Lauren Walters on 03/22/20 at  4:00 PM EST by a video enabled telemedicine application and verified that I am speaking with the correct person using two identifiers.  Location: Patient: home Provider: work   I discussed the limitations of evaluation and management by telemedicine and the availability of in person appointments. The patient expressed understanding and agreed to proceed. Only the patient and myself were present for today's video call.   History of Present Illness:  Patient is a 60 yr old female who presents today for routine follow up visit.    HTN- maintained on hctz 25 mg and zestril 5mg . She is not checking her blood pressure. Reports that she feels good.    BP Readings from Last 3 Encounters:  09/18/19 (!) 132/80  07/17/19 126/69  05/25/19 131/76   GERD- maintained on omeprazole 40mg .  Reports feeling stable.    Osteopenia- last bone density was 7/20. She continues caltate bid.        Observations/Objective:   Gen: Awake, alert, no acute distress Resp: Breathing is even and non-labored Psych: calm/pleasant demeanor Neuro: Alert and Oriented x 3, + facial symmetry, speech is clear.   Assessment and Plan:  HTN- She will check her blood pressure reading and send it to me via mychart for review.   Continue lisinopril 5mg  and hctz 25mg . BMET today is stable.   GERD- stable on omeprazole 40mg , continue same.   Osteopenia- continue caltrate bid. Bone density UTD.    Follow Up Instructions:    I discussed the assessment and treatment plan with the patient. The patient was provided an opportunity to ask questions and all were answered. The patient agreed with the plan and demonstrated an understanding of the instructions.   The patient was advised to call back or seek an in-person evaluation if the symptoms worsen or if the condition fails to improve as anticipated.  Nance Pear, NP

## 2020-03-22 NOTE — Telephone Encounter (Signed)
Please contact pt to schedule a nurse visit for bp check and lab work.

## 2020-03-23 ENCOUNTER — Other Ambulatory Visit (INDEPENDENT_AMBULATORY_CARE_PROVIDER_SITE_OTHER): Payer: PRIVATE HEALTH INSURANCE

## 2020-03-23 ENCOUNTER — Ambulatory Visit (INDEPENDENT_AMBULATORY_CARE_PROVIDER_SITE_OTHER): Payer: PRIVATE HEALTH INSURANCE | Admitting: Family Medicine

## 2020-03-23 ENCOUNTER — Other Ambulatory Visit: Payer: Self-pay

## 2020-03-23 DIAGNOSIS — I10 Essential (primary) hypertension: Secondary | ICD-10-CM | POA: Diagnosis not present

## 2020-03-23 NOTE — Progress Notes (Signed)
Pt here for Blood pressure check per Melissa O'sullivan  Pt currently takes: Lisinopril 5 mg one tab daily and hydrochlorothiazide 25 mg one tab daily   Pt reports compliance with medication.  BP today @ =118/72 HR =65  Pt advised per Dr. Lorelei Pont , pt should continue current regimen and follow up with PCP per PCP.

## 2020-03-24 LAB — BASIC METABOLIC PANEL
BUN: 16 mg/dL (ref 6–23)
CO2: 33 mEq/L — ABNORMAL HIGH (ref 19–32)
Calcium: 9.8 mg/dL (ref 8.4–10.5)
Chloride: 100 mEq/L (ref 96–112)
Creatinine, Ser: 0.74 mg/dL (ref 0.40–1.20)
GFR: 87.23 mL/min (ref >60.00)
Glucose, Bld: 88 mg/dL (ref 70–99)
Potassium: 4.2 mEq/L (ref 3.5–5.1)
Sodium: 139 mEq/L (ref 135–145)

## 2020-04-12 ENCOUNTER — Encounter: Payer: Self-pay | Admitting: Family

## 2020-04-18 ENCOUNTER — Ambulatory Visit: Payer: PRIVATE HEALTH INSURANCE | Admitting: Family

## 2020-04-18 ENCOUNTER — Encounter: Payer: Self-pay | Admitting: Family

## 2020-04-18 ENCOUNTER — Other Ambulatory Visit: Payer: Self-pay

## 2020-04-18 VITALS — BP 117/67 | HR 64 | Temp 97.9°F | Resp 16 | Ht 70.0 in | Wt 174.0 lb

## 2020-04-18 DIAGNOSIS — L820 Inflamed seborrheic keratosis: Secondary | ICD-10-CM

## 2020-04-18 DIAGNOSIS — L918 Other hypertrophic disorders of the skin: Secondary | ICD-10-CM

## 2020-04-18 DIAGNOSIS — L989 Disorder of the skin and subcutaneous tissue, unspecified: Secondary | ICD-10-CM

## 2020-04-18 NOTE — Patient Instructions (Signed)
Please keep area clean and dry for 24 hrs.    Skin Biopsy, Care After This sheet gives you information about how to care for yourself after your procedure. Your health care provider may also give you more specific instructions. If you have problems or questions, contact your health care provider. What can I expect after the procedure? After the procedure, it is common to have:  Soreness.  Bruising.  Itching. Follow these instructions at home: Biopsy site care Follow instructions from your health care provider about how to take care of your biopsy site. Make sure you:  Wash your hands with soap and water before and after you change your bandage (dressing). If soap and water are not available, use hand sanitizer.  Apply ointment on your biopsy site as directed by your health care provider.  Change your dressing as told by your health care provider.  Leave stitches (sutures), skin glue, or adhesive strips in place. These skin closures may need to stay in place for 2 weeks or longer. If adhesive strip edges start to loosen and curl up, you may trim the loose edges. Do not remove adhesive strips completely unless your health care provider tells you to do that.  If the biopsy area bleeds, apply gentle pressure for 10 minutes. Check your biopsy site every day for signs of infection. Check for:  Redness, swelling, or pain.  Fluid or blood.  Warmth.  Pus or a bad smell.   General instructions  Rest and then return to your normal activities as told by your health care provider.  Take over-the-counter and prescription medicines only as told by your health care provider.  Keep all follow-up visits as told by your health care provider. This is important. Contact a health care provider if:  You have redness, swelling, or pain around your biopsy site.  You have fluid or blood coming from your biopsy site.  Your biopsy site feels warm to the touch.  You have pus or a bad smell coming  from your biopsy site.  You have a fever.  Your sutures, skin glue, or adhesive strips loosen or come off sooner than expected. Get help right away if:  You have bleeding that does not stop with pressure or a dressing. Summary  After the procedure, it is common to have soreness, bruising, and itching at the site.  Follow instructions from your health care provider about how to take care of your biopsy site.  Check your biopsy site every day for signs of infection.  Contact a health care provider if you have redness, swelling, or pain around your biopsy site, or your biopsy site feels warm to the touch.  Keep all follow-up visits as told by your health care provider. This is important. This information is not intended to replace advice given to you by your health care provider. Make sure you discuss any questions you have with your health care provider. Document Revised: 08/12/2017 Document Reviewed: 08/12/2017 Elsevier Patient Education  [place].

## 2020-04-18 NOTE — Progress Notes (Signed)
Subjective:    Patient ID: Lauren Walters, female    DOB: Sep 01, 1960, 60 y.o.   MRN: [id number]  HPI  Patient is a 60 yr old female who presents today with concern about skin lesion on her left upper back. She notes that the area has been changing in color.  Review of Systems See HPI  Past Medical History:  Diagnosis Date  . Arthritis   . GERD (gastroesophageal reflux disease)   . History of chicken pox   . Hypertension   . Osteopenia      Social History   Socioeconomic History  . Marital status: Married    Spouse name: Not on file  . Number of children: Not on file  . Years of education: Not on file  . Highest education level: Not on file  Occupational History  . Not on file  Tobacco Use  . Smoking status: Former Smoker    Quit date: 05/17/2015    Years since quitting: 4.9  . Smokeless tobacco: Never Used  Substance and Sexual Activity  . Alcohol use: Yes    Alcohol/week: 14.0 standard drinks    Types: 14 Glasses of wine per week    Comment: 2 glasses of wine a night  . Drug use: No  . Sexual activity: Not on file  Other Topics Concern  . 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 CESAREAN SECTION  1995  . CESAREAN SECTION  1997  . CHOLECYSTECTOMY  1991  . JOINT REPLACEMENT     R hip  . OVARIAN CYST REMOVAL  1990  . ROOT CANAL    . TOTAL HIP ARTHROPLASTY  2011  . WISDOM TOOTH EXTRACTION      Family History  Problem Relation Age of Onset  . Alcohol abuse Father        deceased   . Cancer Father        prostate, basal cell   . Arthritis Father   . Heart disease Father   . Arthritis Paternal Grandfather   . Heart disease Paternal Grandfather   . Dementia Mother   . Hypertension Mother   . CVA Mother   . Heart disease Maternal Grandfather   . Lymphoma Brother   . Colon cancer Neg Hx     Allergies  Allergen Reactions  . Metoprolol Tartrate Other (See Comments)    Chest pain, increased palpitations    Current Outpatient Medications on File Prior to Visit  Medication Sig Dispense Refill  . amoxicillin (AMOXIL) 500 MG capsule Take 2000mg  by mouth once prior to dental work 4 capsule 5  . betamethasone valerate ointment (VALISONE) 0.1 % Apply 1 application topically 2 (two) times daily. 30 g 0  . Calcium Carbonate-Vitamin D 600-400 MG-UNIT chew tablet Chew 1 tablet by mouth 2 (two) times daily.    . cetirizine (ZYRTEC) 10 MG tablet Take 1 tablet (10 mg total) by mouth daily. 30 tablet 11  .  hydrochlorothiazide (HYDRODIURIL) 25 MG tablet TAKE ONE TABLET BY MOUTH DAILY 90 tablet 1  . ibuprofen (ADVIL) 600 MG tablet Take 1 tablet (600 mg total) by mouth every 8 (eight) hours as needed. 30 tablet 0  . lisinopril (ZESTRIL) 5 MG tablet TAKE ONE TABLET BY MOUTH DAILY 90 tablet 1  . omeprazole (PRILOSEC) 40 MG capsule TAKE ONE CAPSULE BY MOUTH DAILY 90 capsule 1   No current facility-administered medications on file prior to visit.    BP 117/67 (BP Location: Right Arm, Patient Position: Sitting, Cuff Size: Small)   Pulse 64   Temp 97.9 F (36.6 C) (Oral)   Resp 16   Ht 5\' 10"  (1.778 m)   Wt 174 lb (78.9 kg)   SpO2 100%   BMI 24.97 kg/m       Objective:   Physical Exam Constitutional:      Appearance: Normal appearance.  Skin:    General: Skin is warm and dry.     Comments: 1 cm raised irregularly pigmented lesion noted on left upper back with some scabbing at the top    Neurological:     Mental Status: She is alert.         Assessment & Plan:   Skin lesion- Procedure including risks/benefits explained to patient.  Questions were answered. After informed consent was obtained and a time out completed, site was cleansed with betadine. 1% Lidocaine with epinephrine was infiltrated into the lesion and then shave biopsy was performed. Area was cauterized to obtain hemostasis.  Pt tolerated procedure well.  Specimen sent for pathology review.  Pt instructed to keep the area dry for 24 hours and to contact us if he develops redness, drainage or swelling at the site.  Pt may use tylenol as needed for discomfort today.   This visit occurred during the SARS-CoV-2 public health emergency.  Safety protocols were in place, including screening questions prior to the visit, additional usage of staff PPE, and extensive cleaning of exam room while observing appropriate contact time as indicated for disinfecting solutions.

## 2020-04-20 ENCOUNTER — Encounter: Payer: Self-pay | Admitting: Family

## 2020-06-28 ENCOUNTER — Other Ambulatory Visit: Payer: Self-pay | Admitting: Family

## 2020-07-05 ENCOUNTER — Ambulatory Visit: Payer: PRIVATE HEALTH INSURANCE | Attending: Internal Medicine

## 2020-07-05 DIAGNOSIS — Z20822 Contact with and (suspected) exposure to covid-19: Secondary | ICD-10-CM | POA: Insufficient documentation

## 2020-07-06 ENCOUNTER — Other Ambulatory Visit: Payer: Self-pay | Admitting: Family

## 2020-07-06 ENCOUNTER — Encounter: Payer: Self-pay | Admitting: Family

## 2020-07-06 MED ORDER — BENZONATATE 100 MG PO CAPS: 100.0000 mg | ORAL_CAPSULE | Freq: Two times a day (BID) | ORAL | 0 refills | Status: DC | PRN

## 2020-07-07 ENCOUNTER — Encounter: Payer: Self-pay | Admitting: Family

## 2020-07-07 ENCOUNTER — Other Ambulatory Visit (HOSPITAL_COMMUNITY): Payer: Self-pay

## 2020-07-07 LAB — SARS-COV-2, NAA 2 DAY TAT

## 2020-07-07 LAB — NOVEL CORONAVIRUS, NAA: SARS-CoV-2, NAA: DETECTED — AB

## 2020-07-07 MED ORDER — MOLNUPIRAVIR 200 MG PO CAPS
800.0000 mg | ORAL_CAPSULE | Freq: Two times a day (BID) | ORAL | 0 refills | Status: AC
  Filled 2020-07-07: qty 40, 5d supply, fill #0

## 2020-07-28 ENCOUNTER — Other Ambulatory Visit: Payer: Self-pay | Admitting: Family

## 2020-08-07 ENCOUNTER — Encounter: Payer: Self-pay | Admitting: Family

## 2020-08-26 ENCOUNTER — Other Ambulatory Visit (HOSPITAL_BASED_OUTPATIENT_CLINIC_OR_DEPARTMENT_OTHER): Payer: Self-pay

## 2020-09-04 ENCOUNTER — Emergency Department (HOSPITAL_COMMUNITY): Payer: No Typology Code available for payment source

## 2020-09-04 ENCOUNTER — Encounter (HOSPITAL_COMMUNITY): Payer: Self-pay | Admitting: Emergency Medicine

## 2020-09-04 ENCOUNTER — Emergency Department (HOSPITAL_COMMUNITY)
Admission: EM | Admit: 2020-09-04 | Discharge: 2020-09-04 | Disposition: A | Discharge status: Home / Self Care | Payer: No Typology Code available for payment source | Attending: Emergency Medicine | Admitting: Emergency Medicine

## 2020-09-04 DIAGNOSIS — I10 Essential (primary) hypertension: Secondary | ICD-10-CM | POA: Insufficient documentation

## 2020-09-04 DIAGNOSIS — R42 Dizziness and giddiness: Secondary | ICD-10-CM | POA: Insufficient documentation

## 2020-09-04 DIAGNOSIS — R0789 Other chest pain: Secondary | ICD-10-CM | POA: Insufficient documentation

## 2020-09-04 DIAGNOSIS — Z96641 Presence of right artificial hip joint: Secondary | ICD-10-CM | POA: Insufficient documentation

## 2020-09-04 DIAGNOSIS — Z87891 Personal history of nicotine dependence: Secondary | ICD-10-CM | POA: Insufficient documentation

## 2020-09-04 DIAGNOSIS — Z79899 Other long term (current) drug therapy: Secondary | ICD-10-CM | POA: Insufficient documentation

## 2020-09-04 DIAGNOSIS — R002 Palpitations: Secondary | ICD-10-CM

## 2020-09-04 LAB — CBC
HCT: 39.8 % (ref 36.0–46.0)
Hemoglobin: 13.2 g/dL (ref 12.0–15.0)
MCH: 31.1 pg (ref 26.0–34.0)
MCHC: 33.2 g/dL (ref 30.0–36.0)
MCV: 93.9 fL (ref 80.0–100.0)
Platelets: 287 10*3/uL (ref 150–400)
RBC: 4.24 MIL/uL (ref 3.87–5.11)
RDW: 13.3 % (ref 11.5–15.5)
WBC: 7.7 10*3/uL (ref 4.0–10.5)
nRBC: 0 % (ref 0.0–0.2)

## 2020-09-04 LAB — BASIC METABOLIC PANEL
Anion gap: 10 (ref 5–15)
BUN: 11 mg/dL (ref 8–23)
CO2: 24 mmol/L (ref 22–32)
Calcium: 9.5 mg/dL (ref 8.9–10.3)
Chloride: 103 mmol/L (ref 98–111)
Creatinine, Ser: 0.65 mg/dL (ref 0.44–1.00)
GFR, Estimated: >60 mL/min (ref >60)
Glucose, Bld: 106 mg/dL — ABNORMAL HIGH (ref 70–99)
Potassium: 3.5 mmol/L (ref 3.5–5.1)
Sodium: 137 mmol/L (ref 135–145)

## 2020-09-04 LAB — TROPONIN I (HIGH SENSITIVITY)
Troponin I (High Sensitivity): <2 ng/L (ref <18)
Troponin I (High Sensitivity): 3 ng/L (ref <18)

## 2020-09-04 LAB — D-DIMER, QUANTITATIVE: D-Dimer, Quant: 1.09 ug/mL-FEU — ABNORMAL HIGH (ref 0.00–0.50)

## 2020-09-04 MED ORDER — MELOXICAM 15 MG PO TABS: 15.0000 mg | ORAL_TABLET | Freq: Every day | ORAL | 0 refills | Status: DC

## 2020-09-04 MED ORDER — IOHEXOL 350 MG/ML SOLN
100.0000 mL | Freq: Once | INTRAVENOUS | Status: AC | PRN
  Administered 2020-09-04: 100 mL via INTRAVENOUS

## 2020-09-04 NOTE — ED Notes (Signed)
to ct

## 2020-09-04 NOTE — Discharge Instructions (Signed)
Please read and follow all provided instructions.  Your diagnoses today include:  1. Atypical chest pain   2. Palpitations     Tests performed today include: An EKG of your heart - was normal A chest x-ray/CT of chest - no sign of pneumonia or blood clot Cardiac enzymes - a blood test for heart muscle damage Blood counts and electrolytes Vital signs. See below for your results today.   Medications prescribed:  Meloxicam - anti-inflammatory pain medication  You have been prescribed an anti-inflammatory medication or NSAID. Take with food. Do not take aspirin, ibuprofen, or naproxen if taking this medication. Take smallest effective dose for the shortest duration needed for your pain. Stop taking if you experience stomach pain or vomiting.   Take any prescribed medications only as directed.  Follow-up instructions: Please follow-up with your primary care provider as soon as you can for further evaluation of your symptoms.   Return instructions:  SEEK IMMEDIATE MEDICAL ATTENTION IF: You have severe chest pain, especially if the pain is crushing or pressure-like and spreads to the arms, back, neck, or jaw, or if you have sweating, nausea (feeling sick to your stomach), or shortness of breath. THIS IS AN EMERGENCY. Don't wait to see if the pain will go away. Get medical help at once. Call 911 or 0 (operator). DO NOT drive yourself to the hospital.  Your chest pain gets worse and does not go away with rest.  You have an attack of chest pain lasting longer than usual, despite rest and treatment with the medications your caregiver has prescribed.  You wake from sleep with chest pain or shortness of breath. You feel dizzy or faint. You have chest pain not typical of your usual pain for which you originally saw your caregiver.  You have any other emergent concerns regarding your health.  Additional Information: Chest pain comes from many different causes. Your caregiver has diagnosed you as  having chest pain that is not specific for one problem, but does not require admission.  You are at low risk for an acute heart condition or other serious illness.   Your vital signs today were: BP (!) 126/58   Pulse 76   Temp 99.7 F (37.6 C) (Oral)   Resp 17   SpO2 96%  If your blood pressure (BP) was elevated above 135/85 this visit, please have this repeated by your doctor within one month. --------------

## 2020-09-04 NOTE — ED Provider Notes (Signed)
[facility] EMERGENCY DEPARTMENT Provider Note   CSN: [id number] Arrival date & time: 09/04/20  1410     History No chief complaint on file.   ALEXCIA SCHOOLS is a 60 y.o. female.  HPI Patient presents with her husband who assists with the history. Patient is generally well, was so until about 1 month ago.  After COVID diagnosis she began having intermittent chest pressure.  She notes that recently she has had episodes during the day that are brief, resolve without intervention.  Today, however, the patient has had multiple episodes of sustained chest pressure with associated lightheadedness. No fever, chills, nausea, vomiting.  No recent medication change, diet change, activity change. No clear alleviating, exacerbating factors today.     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   Atypical chest pain 11/11/2015   Osteopenia 03/08/2015   HTN (hypertension) 03/05/2015   Neck pain 01/30/2015   Preventative health care 01/30/2015   Physical exam 12/18/2013   Postmenopausal HRT (hormone replacement therapy) 07/17/2013   Skin tag 04/24/2011   Varicose veins 04/24/2011   DEGENERATIVE JOINT DISEASE, RIGHT HIP 11/11/2008   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     Family History  Problem Relation Age of Onset   Alcohol abuse Father        deceased   Cancer Father         prostate, basal cell    Arthritis Father    Heart disease Father    Arthritis Paternal Grandfather    Heart disease Paternal Grandfather    Dementia Mother    Hypertension Mother    CVA Mother    Heart disease Maternal Grandfather    Lymphoma Brother    Colon cancer Neg Hx     Social History   Tobacco Use   Smoking status: Former    Pack years: 0.00    Types: Cigarettes    Quit date: 05/17/2015    Years since quitting: 5.3   Smokeless tobacco: Never  Substance Use Topics   Alcohol use: Yes    Alcohol/week: 14.0 standard drinks    Types: 14 Glasses of wine per week    Comment: 2 glasses of wine a night   Drug use: No    Home Medications Prior to Admission medications   Medication Sig Start Date End Date Taking? Authorizing Provider  amoxicillin (AMOXIL) 500 MG capsule Take 2000mg  by mouth once prior to dental work Patient taking differently: Take 2,000 mg by mouth See admin instructions. Take 2,000 mg by mouth one hour prior to dental work 10/26/19  Yes Inda Castle, Lenna Sciara, NP  betamethasone valerate ointment (VALISONE) 0.1 % Apply 1 application topically 2 (two) times daily. Patient taking differently:  Apply 1 application topically 2 (two) times daily as needed (for itching). 09/18/19  Yes Debbrah Alar, NP  Calcium Carbonate-Vitamin D 600-400 MG-UNIT chew tablet Chew 1 tablet by mouth 2 (two) times daily. 09/02/18  Yes Debbrah Alar, NP  cetirizine (ZYRTEC) 10 MG tablet Take 1 tablet (10 mg total) by mouth daily. 08/02/17  Yes Debbrah Alar, NP  hydrochlorothiazide (HYDRODIURIL) 25 MG tablet TAKE ONE TABLET BY MOUTH DAILY Patient taking differently: Take 25 mg by mouth in the morning. 06/28/20  Yes Debbrah Alar, NP  lisinopril (ZESTRIL) 5 MG tablet TAKE ONE TABLET BY MOUTH DAILY Patient taking differently: Take 5 mg by mouth in the morning. 07/28/20  Yes Debbrah Alar, NP  omeprazole (PRILOSEC) 40 MG capsule TAKE ONE CAPSULE BY MOUTH DAILY Patient  taking differently: Take 40 mg by mouth daily before breakfast. 06/28/20  Yes Debbrah Alar, NP  benzonatate (TESSALON) 100 MG capsule Take 1 capsule (100 mg total) by mouth 2 (two) times daily as needed for cough. Patient not taking: Reported on 09/04/2020 07/06/20   Debbrah Alar, NP  ibuprofen (ADVIL) 600 MG tablet Take 1 tablet (600 mg total) by mouth every 8 (eight) hours as needed. Patient not taking: No sig reported 04/14/19   Rosemarie Ax, MD    Allergies    Metoprolol tartrate  Review of Systems   Review of Systems  Constitutional:        Per HPI, otherwise negative  HENT:         Per HPI, otherwise negative  Respiratory:         Per HPI, otherwise negative  Cardiovascular:        Per HPI, otherwise negative  Gastrointestinal:  Negative for vomiting.  Endocrine:       Negative aside from HPI  Genitourinary:        Neg aside from HPI   Musculoskeletal:        Per HPI, otherwise negative  Skin: Negative.   Neurological:  Negative for syncope.   Physical Exam Updated Vital Signs BP 135/61   Pulse 66   Temp 99.7 F (37.6 C) (Oral)   Resp 16   SpO2 100%   Physical Exam Vitals and nursing note reviewed.  Constitutional:      General: She is not in acute distress.    Appearance: She is well-developed.  HENT:     Head: Normocephalic and atraumatic.  Eyes:     Conjunctiva/sclera: Conjunctivae normal.  Cardiovascular:     Rate and Rhythm: Normal rate and regular rhythm.  Pulmonary:     Effort: Pulmonary effort is normal. No respiratory distress.     Breath sounds: Normal breath sounds. No stridor.  Abdominal:     General: There is no distension.  Skin:    General: Skin is warm and dry.  Neurological:     Mental Status: She is alert and oriented to person, place, and time.     Cranial Nerves: No cranial nerve deficit.    ED Results / Procedures / Treatments   Labs (all labs ordered are listed, but only abnormal results are displayed) Labs  Reviewed  BASIC METABOLIC PANEL  CBC  TROPONIN I (HIGH SENSITIVITY)    EKG EKG Interpretation  Date/Time:  Sunday September 04 2020 14:26:06 EDT Ventricular Rate:  66 PR Interval:  138 QRS Duration: 86 QT Interval:  442 QTC Calculation: 463 R Axis:   85 Text Interpretation: Normal sinus rhythm Normal ECG Confirmed by Carmin Muskrat [phone]) on 09/04/2020  3:13:21 PM  Radiology DG Chest 2 View  Result Date: 09/04/2020 CLINICAL DATA:  Chest pain and tachycardia.  Lightheadedness. EXAM: CHEST - 2 VIEW COMPARISON:  None. FINDINGS: Lungs are adequately inflated without focal airspace consolidation or effusion. Cardiomediastinal silhouette is within normal. Minimal degenerative change of the spine. IMPRESSION: No active cardiopulmonary disease. Electronically Signed   By: Marin Olp M.D.   On: 09/04/2020 15:11    Procedures Procedures   Medications Ordered in ED Medications - No data to display  ED Course  I have reviewed the triage vital signs and the nursing notes.  Pertinent labs & imaging results that were available during my care of the patient were reviewed by me and considered in my medical decision making (see chart for details). Cardiac 60s sinus normal Pulse ox 99% room air normal   8:33 PM Patient awake, alert, remains hemodynamically about the same. We reviewed initial findings which appear notable for unremarkable troponin, reassuring electrolytes.  Patient, however has positive D-dimer, given recent COVID, elevated thrombotic profile secondary to this, CT angiography is pending.  No early evidence for pneumonia, hemodynamic instability, bacteremia, sepsis, pneumothorax.   With otherwise reassuring labs, EKG, 2 normal troponin, patient may be appropriate for discharge pending angiography results.  PA Geiple is aware. MDM Rules/Calculators/A&P MDM Number of Diagnoses or Management Options Atypical chest pain: new, needed workup   Amount and/or Complexity of Data  Reviewed Clinical lab tests: ordered and reviewed Tests in the radiology section of CPT: ordered and reviewed Tests in the medicine section of CPT: reviewed and ordered Decide to obtain previous medical records or to obtain history from someone other than the patient: yes Review and summarize past medical records: yes Discuss the patient with other providers: yes Independent visualization of images, tracings, or specimens: yes  Risk of Complications, Morbidity, and/or Mortality Presenting problems: high Diagnostic procedures: high Management options: high  Critical Care Total time providing critical care: < 30 minutes  Patient Progress Patient progress: stable   Final Clinical Impression(s) / ED Diagnoses Final diagnoses:  Atypical chest pain     Carmin Muskrat, MD 09/04/20 2035

## 2020-09-04 NOTE — ED Triage Notes (Signed)
C/o chest pressure all day today with intermittent episodes of feeling lightheaded and R arm tingling.  Reports episodes of heart beating fast over the past few days.

## 2020-09-04 NOTE — ED Provider Notes (Signed)
10:25 PM signout from Dr. Vanita Panda at shift change.  Patient awaiting CT angio of the chest given elevated D-dimer in setting of recent COVID infection.  Remainder of cardiac work-up is reassuring.  Patient and family at bedside updated on results.  She continues to have some mild upper chest tightness and sensation of fast heart rate.  Lungs are clear without wheezing.  Patient has a regular rhythm at a normal rate.  Will give prescription for meloxicam to treat for pleurisy or possible chest wall pain.  Symptoms are not changed with food and her symptoms seem less GI in nature.  She already takes omeprazole daily.  Encourage PCP follow-up for recheck to ensure resolution.  BP (!) 126/58   Pulse 76   Temp 99.7 F (37.6 C) (Oral)   Resp 17   SpO2 96%   Patient verbalizes understanding and is comfortable with discharge to home.    Carlisle Cater, PA-C 09/04/20 2227    Carmin Muskrat, MD 09/05/20 [phone]

## 2020-09-09 ENCOUNTER — Ambulatory Visit: Payer: No Typology Code available for payment source | Admitting: Family

## 2020-09-09 ENCOUNTER — Other Ambulatory Visit: Payer: Self-pay

## 2020-09-09 ENCOUNTER — Encounter: Payer: Self-pay | Admitting: Family

## 2020-09-09 VITALS — Ht 70.0 in | Wt 175.0 lb

## 2020-09-09 DIAGNOSIS — R0789 Other chest pain: Secondary | ICD-10-CM

## 2020-09-09 DIAGNOSIS — R079 Chest pain, unspecified: Secondary | ICD-10-CM | POA: Diagnosis not present

## 2020-09-09 NOTE — Assessment & Plan Note (Signed)
New.  EKG tracing is personally reviewed.  EKG notes NSR.  No acute changes.  Will refer to cardiology for further evaluation. She will continue omeprazole for gerd symptoms. Advised pt to follow back up with me if cardiology work up is negative and symptoms persist. May consider referral to GI at that time.

## 2020-09-09 NOTE — Progress Notes (Addendum)
Subjective:   By signing my name below, I, Shehryar Baig, attest that this documentation has been prepared under the direction and in the presence of Debbrah Alar NP. 09/09/2020   Patient ID: Lauren Walters, female    DOB: [birth year], 60 y.o.   MRN: [id number]  Chief Complaint  Patient presents with  . Hospitalization Follow-up    Chest pain , onset: gradual     HPI Patient is in today for a ED follow up.  She was admitted to the ED on 09/04/2020 for atypical chest pain. She had covid-19 infection back in May of this year. She continues having mild chest pain on her upper middle chest and continues taking 15 mg meloxicam daily PO to manage her pain. She prefers not using NSAID's long term because it causes her to gain weight. Her pain does not worsens with inhalation or expiration, and it does not worsen with her reflux. She continues taking mg omeprazole daily PO to manage her reflux symptoms. She reports having no new outstanding stress in her daily life. She is willing to meet with a cardiologist to manage her symptoms. She also reports having episodes of acute dizziness since her visit to the ED.  She was diagnosed with Covid-19 on 07/07/2020.   Health Maintenance Due  Topic Date Due  . Pneumococcal Vaccine 92-28 Years old (1 - PCV) Never done  . COVID-19 Vaccine (4 - Booster) 03/23/2020    Past Medical History:  Diagnosis Date  . Arthritis   . GERD (gastroesophageal reflux disease)   . History of chicken pox   . Hypertension   . Osteopenia     Past Surgical History:  Procedure Laterality Date  . CESAREAN SECTION  1995  . CESAREAN SECTION  1997  . CHOLECYSTECTOMY  1991  . JOINT REPLACEMENT     R hip  . OVARIAN CYST REMOVAL  1990  . ROOT CANAL    . TOTAL HIP ARTHROPLASTY  2011  . WISDOM TOOTH EXTRACTION      Family History  Problem Relation Age of Onset  . Alcohol abuse Father        deceased  . Cancer Father        prostate, basal cell   . Arthritis  Father   . Heart disease Father   . Arthritis Paternal Grandfather   . Heart disease Paternal Grandfather   . Dementia Mother   . Hypertension Mother   . CVA Mother   . Heart disease Maternal Grandfather   . Lymphoma Brother   . Colon cancer Neg Hx     Social History   Socioeconomic History  . Marital status: Married    Spouse name: Not on file  . Number of children: Not on file  . Years of education: Not on file  . Highest education level: Not on file  Occupational History  . Not on file  Tobacco Use  . Smoking status: Former    Types: Cigarettes    Quit date: 05/17/2015    Years since quitting: 5.3  . Smokeless tobacco: Never  Substance and Sexual Activity  . Alcohol use: Yes    Alcohol/week: 14.0 standard drinks    Types: 14 Glasses of wine per week    Comment: 2 glasses of wine a night  . Drug use: No  . Sexual activity: Not on file  Other Topics Concern  . 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 amoxicillin (AMOXIL) 500 MG capsule Take 2000mg  by mouth once prior to dental work (Patient taking differently: Take 2,000 mg by mouth See admin instructions. Take 2,000 mg by mouth one hour prior to dental work) 4 capsule 5  . betamethasone valerate ointment (VALISONE) 0.1 % Apply 1 application topically 2 (two) times daily. (Patient taking differently: Apply 1 application topically 2 (two) times daily as needed (for itching).) 30 g 0  . Calcium Carbonate-Vitamin D 600-400 MG-UNIT  chew tablet Chew 1 tablet by mouth 2 (two) times daily.    . cetirizine (ZYRTEC) 10 MG tablet Take 1 tablet (10 mg total) by mouth daily. 30 tablet 11  . hydrochlorothiazide (HYDRODIURIL) 25 MG tablet TAKE ONE TABLET BY MOUTH DAILY (Patient taking differently: Take 25 mg by mouth in the morning.) 90 tablet 1  . lisinopril (ZESTRIL) 5 MG tablet TAKE ONE TABLET BY MOUTH DAILY (Patient taking differently: Take 5 mg by mouth in the morning.) 60 tablet 1  . meloxicam (MOBIC) 15 MG tablet Take 1 tablet (15 mg total) by mouth daily. 10 tablet 0  . omeprazole (PRILOSEC) 40 MG capsule TAKE ONE CAPSULE BY MOUTH DAILY (Patient taking differently: Take 40 mg by mouth daily before breakfast.) 90 capsule 1   No facility-administered medications prior to visit.    Allergies  Allergen Reactions  . Metoprolol Tartrate Palpitations and Other (See Comments)    Chest pain, increased palpitations    Review of Systems  Cardiovascular:  Positive for chest pain (MIld).  Neurological:  Positive for dizziness (Occasional acute episodes).      Objective:    Physical Exam Constitutional:      General: She is not in acute distress.    Appearance: Normal appearance. She is not ill-appearing.  HENT:     Head: Normocephalic and atraumatic.     Right Ear: External ear normal.     Left Ear: External ear normal.  Eyes:     Extraocular Movements: Extraocular movements intact.     Pupils: Pupils are equal, round, and reactive to light.  Cardiovascular:     Rate and Rhythm: Normal rate and regular rhythm.     Pulses: Normal pulses.     Heart sounds: Normal heart sounds. No murmur heard.   No gallop.     Comments: No reproducible chest wall tenderness Pulmonary:     Effort: Pulmonary effort is normal. No respiratory distress.     Breath sounds: Normal breath sounds. No wheezing, rhonchi or rales.  Abdominal:     General: Bowel sounds are normal.     Palpations: Abdomen is soft.  Skin:    General: Skin is  warm and dry.  Neurological:     Mental Status: She is alert and oriented to person, place, and time.  Psychiatric:        Behavior: Behavior normal.    Ht 5\' 10"  (1.778 m)   Wt 175 lb (79.4 kg)   BMI 25.11 kg/m  Wt Readings from  Last 3 Encounters:  09/09/20 175 lb (79.4 kg)  04/18/20 174 lb (78.9 kg)  09/18/19 173 lb (78.5 kg)       Assessment & Plan:   Problem List Items Addressed This Visit       Unprioritized   Atypical chest pain    New.  EKG tracing is personally reviewed.  EKG notes NSR.  No acute changes.  Will refer to cardiology for further evaluation. She will continue omeprazole for gerd symptoms. Advised pt to follow back up with me if cardiology work up is negative and symptoms persist. May consider referral to GI at that time.        Relevant Orders   Ambulatory referral to Cardiology   Other Visit Diagnoses     Chest pain, unspecified type    -  Primary   Relevant Orders   EKG 12-Lead (Completed)        No orders of the defined types were placed in this encounter.   I, Debbrah Alar NP, personally preformed the services described in this documentation.  All medical record entries made by the scribe were at my direction and in my presence.  I have reviewed the chart and discharge instructions (if applicable) and agree that the record reflects my personal performance and is accurate and complete. 09/09/2020   I,Shehryar Baig,acting as a Education administrator for Nance Pear, NP.,have documented all relevant documentation on the behalf of Nance Pear, NP,as directed by  Nance Pear, NP while in the presence of Nance Pear, NP.   Nance Pear, NP

## 2020-09-09 NOTE — Patient Instructions (Signed)
You should be contacted about scheduling your appointment with cardiology.

## 2020-09-21 DIAGNOSIS — Z8619 Personal history of other infectious and parasitic diseases: Secondary | ICD-10-CM | POA: Insufficient documentation

## 2020-09-21 DIAGNOSIS — M1990 Unspecified osteoarthritis, unspecified site: Secondary | ICD-10-CM | POA: Insufficient documentation

## 2020-09-21 DIAGNOSIS — K219 Gastro-esophageal reflux disease without esophagitis: Secondary | ICD-10-CM | POA: Insufficient documentation

## 2020-09-23 ENCOUNTER — Encounter: Payer: Self-pay | Admitting: Cardiology

## 2020-09-23 ENCOUNTER — Ambulatory Visit: Payer: No Typology Code available for payment source | Admitting: Cardiology

## 2020-09-23 ENCOUNTER — Other Ambulatory Visit: Payer: Self-pay

## 2020-09-23 VITALS — BP 132/72 | HR 72 | Ht 70.0 in | Wt 177.0 lb

## 2020-09-23 DIAGNOSIS — I10 Essential (primary) hypertension: Secondary | ICD-10-CM

## 2020-09-23 DIAGNOSIS — R0789 Other chest pain: Secondary | ICD-10-CM | POA: Diagnosis not present

## 2020-09-23 DIAGNOSIS — I700 Atherosclerosis of aorta: Secondary | ICD-10-CM | POA: Diagnosis not present

## 2020-09-23 MED ORDER — ATORVASTATIN CALCIUM 10 MG PO TABS: 10.0000 mg | ORAL_TABLET | Freq: Every day | ORAL | 3 refills | Status: DC

## 2020-09-23 NOTE — Progress Notes (Signed)
Cardiology Office Note:    Date:  09/23/2020   ID:  Lauren Walters, DOB [birth year], MRN [id number]  PCP:  Debbrah Alar, NP  Cardiologist:  Jenean Lindau, MD   Referring MD: Debbrah Alar, NP    ASSESSMENT:    1. Primary hypertension   2. Atypical chest pain   3. Aortic calcification (HCC)    PLAN:    In order of problems listed above:  EKG reveals sinus rhythm and nonspecific ST-T changes Primary prevention stressed with the patient.  Importance of compliance with diet medication stressed and she vocalized understanding.  She exercises on a regular basis. 3.   Chest pain: Atypical features which she is concerned about it.  For reassurance we will   do     exercise stress echo.  She is agreeable. 4.   Aortic atherosclerosis: I discussed my findings with her based on the reports of the previous CT scan. Mixed dyslipidemia: In view of aortic atherosclerosis I recommended lipid-lowering with statin therapy and she is agreeable.  We will start atorvastatin 10 mg daily and liver lipid check will be done in 6 weeks.  Diet was emphasized. Patient will be seen in follow-up appointment in 6 months or earlier if the patient has any concerns  Medication Adjustments/Labs and Tests Ordered: Current medicines are reviewed at length with the patient today.  Concerns regarding medicines are outlined above.  No orders of the defined types were placed in this encounter.  No orders of the defined types were placed in this encounter.    History of Present Illness:    Lauren Walters is a 60 y.o. female who is being seen today for the evaluation of chest pain at the request of Debbrah Alar, NP.  Patient is a pleasant 60 year old female.  She is an active lady.  She exercises on a regular basis.  She mentions to me that she occasionally has skipped beat like sensation in her heart and palpitations which are very transient and last 2 to 3 seconds.  No orthopnea or PND.   She has chest discomfort at times.  She also mentions to me however that she exercises on the right side bicycles without any symptoms.  Her chest discomfort did not radiate to the neck or to the arms.  She is concerned about it.  At the time of my evaluation, the patient is alert awake oriented and in no distress.  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    HTN (hypertension) 03/05/2015   Inclusion cyst 02/17/2018   Left knee pain 09/24/2016   Neck pain 01/30/2015   Osteopenia    Physical exam 12/18/2013   Postmenopausal HRT (hormone replacement therapy) 07/17/2013   Preventative health care 01/30/2015   Skin tag 04/24/2011   Tinnitus of left ear 09/27/2017   Last Assessment & Plan:  Formatting of this note might be different from the original. Concern over ringing at the left ear. 68-monthhistory.  No associated vertigo.  Some associated ear fullness.  No history of ear trauma surgery or infection.  No major  history of noise exposure. EXAM shows normal external canals and tympanic membranes. PLAN: We discussed probably there is some inflammation.  We    Varicose veins 04/24/2011    Past Surgical History:  Procedure Laterality Date   [place]   JOINT REPLACEMENT     R hip   OVARIAN CYST REMOVAL  1990   ROOT CANAL     TOTAL HIP ARTHROPLASTY  2011   WISDOM TOOTH EXTRACTION      Current Medications: Current Meds  Medication Sig   amoxicillin (AMOXIL) 500 MG capsule Take '2000mg'$  by mouth once prior to dental work   betamethasone valerate ointment (VALISONE) 0.1 % Apply 1 application  topically 2 (two) times daily.   Calcium Carbonate-Vitamin D 600-400 MG-UNIT chew tablet Chew 1 tablet by mouth 2 (two) times daily.   cetirizine (ZYRTEC) 10 MG tablet Take 1 tablet (10 mg total) by mouth daily.   hydrochlorothiazide (HYDRODIURIL) 25 MG tablet TAKE ONE TABLET BY MOUTH DAILY   lisinopril (ZESTRIL) 5 MG tablet TAKE ONE TABLET BY MOUTH DAILY   omeprazole (PRILOSEC) 40 MG capsule TAKE ONE CAPSULE BY MOUTH DAILY     Allergies:   Metoprolol tartrate   Social History   Socioeconomic History   Marital status: Married    Spouse name: Not on file   Number of children: Not on file   Years of education: Not on file   Highest education level: Not on file  Occupational History   Not on file  Tobacco Use   Smoking status: Former    Types: Cigarettes    Quit date: 05/17/2015    Years since quitting: 5.3   Smokeless tobacco: Never  Substance and Sexual Activity   Alcohol use: Yes    Alcohol/week: 14.0 standard drinks    Types: 14 Glasses of wine per week    Comment: 2 glasses of wine a night   Drug use: No   Sexual activity: Not on file  Other Topics Concern   Not on file  Social History Narrative   31- daughter   1997-son   1998- daughter      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   Married   Biking/tennis/swimming, reading   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 There is no history of Colon cancer.  ROS:   Please see the history of  present illness.    All other systems reviewed and are negative.  EKGs/Labs/Other Studies Reviewed:    The following studies were reviewed today: CT ANGIOGRAPHY CHEST WITH CONTRAST   TECHNIQUE: Multidetector CT imaging of the chest was performed using the standard protocol during bolus administration of intravenous contrast. Multiplanar CT image reconstructions and MIPs were obtained to evaluate the vascular anatomy.   CONTRAST:  12m OMNIPAQUE IOHEXOL 350 MG/ML SOLN   COMPARISON:  Chest CT dated 10/21/2018. Chest radiograph dated 09/04/2020.   FINDINGS: Cardiovascular: There  is no cardiomegaly or pericardial effusion. Mild atherosclerotic calcification of thoracic aorta. No aneurysmal dilatation or dissection. The origins of the great vessels of the aortic arch appear patent as visualized. No pulmonary artery embolus identified.   Mediastinum/Nodes: No hilar or mediastinal adenopathy. The esophagus and the thyroid gland are grossly unremarkable. No mediastinal fluid collection.   Lungs/Pleura: There is a 3 mm right upper lobe nodule (57/6), present on the prior CT. No focal consolidation, pleural effusion, or pneumothorax. The central airways are patent.   Upper Abdomen: No acute abnormality.   Musculoskeletal: Degenerative changes of the spine. No acute osseous pathology.   Review of the MIP images confirms the above findings.   IMPRESSION: No acute intrathoracic pathology. No CT evidence of pulmonary embolism.     Electronically Signed   By: Anner Crete M.D.   On: 09/04/2020 21:58     Recent Labs: 09/04/2020: BUN 11; Creatinine, Ser 0.65; Hemoglobin 13.2; Platelets 287; Potassium 3.5; Sodium 137  Recent Lipid Panel    Component Value Date/Time   CHOL 229 (H) 09/18/2019 0806   TRIG 61.0 09/18/2019 0806   HDL 108.00 09/18/2019 0806   CHOLHDL 2 09/18/2019 0806   VLDL 12.2 09/18/2019 0806   LDLCALC 109 (H) 09/18/2019 0806    Physical Exam:    VS:   BP 132/72   Pulse 72   Ht '5\' 10"'$  (1.778 m)   Wt 177 lb (80.3 kg)   SpO2 96%   BMI 25.40 kg/m     Wt Readings from Last 3 Encounters:  09/23/20 177 lb (80.3 kg)  09/09/20 175 lb (79.4 kg)  04/18/20 174 lb (78.9 kg)     GEN: Patient is in no acute distress HEENT: Normal NECK: No JVD; No carotid bruits LYMPHATICS: No lymphadenopathy CARDIAC: S1 S2 regular, 2/6 systolic murmur at the apex. RESPIRATORY:  Clear to auscultation without rales, wheezing or rhonchi  ABDOMEN: Soft, non-tender, non-distended MUSCULOSKELETAL:  No edema; No deformity  SKIN: Warm and dry NEUROLOGIC:  Alert and oriented x 3 PSYCHIATRIC:  Normal affect    Signed, Jenean Lindau, MD  09/23/2020 2:49 PM    [facility]

## 2020-09-23 NOTE — Patient Instructions (Addendum)
Medication Instructions:  Your physician has recommended you make the following change in your medication:   Start Atorvastatin 10 mg daily.  *If you need a refill on your cardiac medications before your next appointment, please call your pharmacy*   Lab Work: Your physician recommends that you return for lab work in: 6 weeks You need to have labs done when you are fasting.  You can come Monday through Friday 8:30 am to 12:00 pm and 1:15 to 4:30. You do not need to make an appointment as the order has already been placed. The labs you are going to have done are LFT and Lipids.  If you have labs (blood work) drawn today and your tests are completely normal, you will receive your results only by: [place] (if you have MyChart) OR A paper copy in the mail If you have any lab test that is abnormal or we need to change your treatment, we will call you to review the results.   Testing/Procedures:      Stress Echocardiogram Information Sheet                                                      Instructions:    1. You may take your morning medications the morning of the test  2. Light breakfast no caffeine  3. Dress prepared to exercise.  4. DO NOT use ANY caffeine or tobacco products 3 hours before appointment.  5. Please bring all current prescription medications.    Follow-Up: At [facility], you and your health needs are our priority.  As part of our continuing mission to provide you with exceptional heart care, we have created designated Provider Care Teams.  These Care Teams include your primary Cardiologist (physician) and Advanced Practice Providers (APPs -  Physician Assistants and Nurse Practitioners) who all work together to provide you with the care you need, when you need it.  We recommend signing up for the patient portal called "MyChart".  Sign up information is provided on this After Visit Summary.  MyChart is used to connect with patients for Virtual Visits  (Telemedicine).  Patients are able to view lab/test results, encounter notes, upcoming appointments, etc.  Non-urgent messages can be sent to your provider as well.   To learn more about what you can do with MyChart, go to [email or web address].    Your next appointment:   6 month(s)  The format for your next appointment:   In Person  Provider:   Jyl Heinz, MD   Other Instructions NA

## 2020-09-28 ENCOUNTER — Encounter: Payer: Self-pay | Admitting: Family

## 2020-09-28 ENCOUNTER — Other Ambulatory Visit: Payer: Self-pay

## 2020-09-28 ENCOUNTER — Telehealth: Payer: Self-pay | Admitting: Family Medicine

## 2020-09-28 ENCOUNTER — Ambulatory Visit (INDEPENDENT_AMBULATORY_CARE_PROVIDER_SITE_OTHER): Payer: No Typology Code available for payment source | Admitting: Family

## 2020-09-28 VITALS — BP 127/67 | HR 62 | Temp 98.5°F | Resp 16 | Ht 70.0 in | Wt 174.0 lb

## 2020-09-28 DIAGNOSIS — L723 Sebaceous cyst: Secondary | ICD-10-CM | POA: Diagnosis not present

## 2020-09-28 DIAGNOSIS — Z0001 Encounter for general adult medical examination with abnormal findings: Secondary | ICD-10-CM

## 2020-09-28 DIAGNOSIS — M25551 Pain in right hip: Secondary | ICD-10-CM | POA: Insufficient documentation

## 2020-09-28 DIAGNOSIS — I10 Essential (primary) hypertension: Secondary | ICD-10-CM

## 2020-09-28 DIAGNOSIS — Z0000 Encounter for general adult medical examination without abnormal findings: Secondary | ICD-10-CM

## 2020-09-28 LAB — COMPREHENSIVE METABOLIC PANEL
ALT: 16 U/L (ref 0–35)
AST: 25 U/L (ref 0–37)
Albumin: 4.6 g/dL (ref 3.5–5.2)
Alkaline Phosphatase: 50 U/L (ref 39–117)
BUN: 11 mg/dL (ref 6–23)
CO2: 29 mEq/L (ref 19–32)
Calcium: 9.8 mg/dL (ref 8.4–10.5)
Chloride: 97 mEq/L (ref 96–112)
Creatinine, Ser: 0.65 mg/dL (ref 0.40–1.20)
GFR: 94.58 mL/min (ref >60.00)
Glucose, Bld: 93 mg/dL (ref 70–99)
Potassium: 4.2 mEq/L (ref 3.5–5.1)
Sodium: 137 mEq/L (ref 135–145)
Total Bilirubin: 0.6 mg/dL (ref 0.2–1.2)
Total Protein: 7 g/dL (ref 6.0–8.3)

## 2020-09-28 NOTE — Progress Notes (Signed)
Subjective:   By signing my name below, I, Shehryar Baig, attest that this documentation has been prepared under the direction and in the presence of Debbrah Alar NP. 09/28/2020    Patient ID: Lauren Walters, female    DOB: [birth year], 60 y.o.   MRN: [id number]  Chief Complaint  Patient presents with   Annual Exam    HPI Patient is in today for a comprehensive physical exam. She denies having any unexpected weight change, hearing loss and rhinorrhea, visual disturbance, cough, chest pain and leg swelling, nausea, vomiting, diarrhea and blood in stool, or dysuria and frequency, for myalgias and arthralgias, rash, headaches, adenopathy, depression or anxiety at this time.  Her mother is diagnosed with aortic valve stenosis, otherwise she has no other changes to her family history. She has no recent surgeries. She continues drinking 2 glasses of wine every night. She does not use drugs. She does not use tobacco products. She is not sexually active at this time. She continues working for [employer]. She notes that there is stress coming from her work.   Hip pain-She reports having right hip pain and is interested in setting up an appointment with a sports medicine specialist to manage her pain.  Mass- She notes having a mass on her right shoulder that has grown since her last surgery. It is not painful at this time. She is interested in getting a referral to her dermatologist to further evaluate and maybe remove it. Atypical Chest pain-  She has an upcoming appointment for a stress echo appointment in September but prefers to go see someone close to her home in [place], [place]. Cholesterol- She has not started taking 10 mg Lipitor daily PO and is hesitant to start taking it due to signs of classification of her aorta. She reports her father passed away from dementia and her mother told her that his Lipitor worsened his dementia.  Lab Results  Component Value Date   CHOL 229 (H)  09/18/2019   HDL 108.00 09/18/2019   LDLCALC 109 (H) 09/18/2019   TRIG 61.0 09/18/2019   CHOLHDL 2 09/18/2019    Blood pressure- She continues taking 5 mg Zestril daily PO, 25 mg hydrochlorothiazide daily PO and reports no new issues while taking it.   BP Readings from Last 3 Encounters:  09/28/20 127/67  09/23/20 132/72  09/04/20 (!) 126/58   Immunizations: She is has 3 Covid-19 vaccines at this time and is willing to take the 4th booster vaccine around the fall season. She is UTD on tetanus vaccines. She is UTD on shingles vaccines.  Diet: She is trying to maintain a low fat diet.  Exercise: She is participating in regular exercise by riding her bike.  Colonoscopy: Last completed 02/01/2016. Results showed one 8 mm polyp in ascending colon which was removed, one 2 mm polyp in sigmoid colon with was removed, one 14 mm polyp in the recto-sigmoid colon which was removed, diverticulosis in entire examined colon, otherwise results are normal. Repeat in 3-5 years.  Dexa: Last completed 09/01/2018. Results showed she is osteopenic. Repeat in 2 years.  Pap Smear: Last completed 09/18/2019. Results normal. Repeat in 3 years.  Mammogram: Last completed 10/06/2019. Results normal. Repeat in 1 year. She is interested in setting up an appointment.  Dental: She is UTD on dental care. Vision: She is not UTD on vision care. She has an upcomming appointment planned this year.  She complains of right hip pain for some time. She would like  to see sports medicine for this.   Health Maintenance Due  Topic Date Due   Pneumococcal Vaccine 79-43 Years old (1 - PCV) Never done   COVID-19 Vaccine (4 - Booster) 03/23/2020   INFLUENZA VACCINE  09/26/2020    Past Medical History:  Diagnosis Date   Adhesive capsulitis of right shoulder 09/24/2016   Arthritis    Atypical chest pain 11/11/2015   DEGENERATIVE JOINT DISEASE, RIGHT HIP 11/11/2008   Qualifier: Diagnosis of  By: Oneida Alar MD, KARL     GERD  (gastroesophageal reflux disease)    History of chicken pox    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    HTN (hypertension) 03/05/2015   Inclusion cyst 02/17/2018   Left knee pain 09/24/2016   Neck pain 01/30/2015   Osteopenia    Physical exam 12/18/2013   Postmenopausal HRT (hormone replacement therapy) 07/17/2013   Preventative health care 01/30/2015   Skin tag 04/24/2011   Tinnitus of left ear 09/27/2017   Last Assessment & Plan:  Formatting of this note might be different from the original. Concern over ringing at the left ear. 59-month history.  No associated vertigo.  Some associated ear fullness.  No history of ear trauma surgery or infection.  No major history of noise exposure. EXAM shows normal external canals and tympanic membranes. PLAN: We discussed probably there is some inflammation.  We    Varicose veins 04/24/2011    Past Surgical History:  Procedure Laterality Date   CESAREAN SECTION  1995   CESAREAN SECTION  1997   CHOLECYSTECTOMY  1991   JOINT REPLACEMENT     R hip   OVARIAN CYST REMOVAL  1990   ROOT CANAL     TOTAL HIP ARTHROPLASTY  2011   WISDOM TOOTH EXTRACTION      Family History  Problem Relation Age of Onset   Dementia Mother    Hypertension Mother    CVA Mother    Aortic stenosis Mother    Alcohol abuse Father        deceased   Cancer Father        prostate, basal cell    Arthritis Father    Heart disease Father    Lymphoma Brother    Heart disease Maternal Grandfather    Arthritis Paternal Grandfather    Heart disease Paternal Grandfather    Colon cancer Neg Hx     Social History   Socioeconomic History   Marital status: Married    Spouse name: Not on file   Number of children: Not on file   Years of education: Not on file   Highest education level: Not on file  Occupational History   Not on file  Tobacco Use   Smoking status: Former    Types: Cigarettes    Quit date: 05/17/2015    Years since  quitting: 5.3   Smokeless tobacco: Never  Substance and Sexual Activity   Alcohol use: Yes    Alcohol/week: 14.0 standard drinks    Types: 14 Glasses of wine per week    Comment: 2 glasses of wine a night   Drug use: No   Sexual activity: Yes    Partners: Male  Other Topics Concern   Not on file  Social History Narrative   38- daughter   1997-son   1998- daughter      Youngest at home   Married   Biking/tennis/swimming, reading  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 90 tablet 3   betamethasone valerate ointment (VALISONE) 0.1 % Apply 1 application topically 2 (two) times daily. 30 g 0   Calcium Carbonate-Vitamin D 600-400 MG-UNIT chew tablet Chew 1 tablet by mouth 2 (two) times daily.     cetirizine (ZYRTEC) 10 MG tablet Take 1 tablet (10 mg total) by mouth daily. 30 tablet 11   hydrochlorothiazide (HYDRODIURIL) 25 MG tablet TAKE ONE TABLET BY MOUTH DAILY 90 tablet 1   lisinopril (ZESTRIL) 5 MG tablet TAKE ONE TABLET BY MOUTH DAILY 60 tablet 1   omeprazole (PRILOSEC) 40 MG capsule TAKE ONE CAPSULE BY MOUTH DAILY 90 capsule 1   No facility-administered medications prior to visit.    Allergies  Allergen Reactions   Metoprolol Tartrate Palpitations and Other (See Comments)    Chest pain, increased palpitations    Review of Systems  Constitutional:        (-)unexpected weight change (-)Adenopathy  HENT:  Negative for hearing loss.        (-)Rhinorrhea    Eyes:        (-)Visual disturbance  Respiratory:  Negative for cough.   Cardiovascular:  Negative for chest pain and leg swelling.  Gastrointestinal:  Negative for blood in stool, constipation, diarrhea, nausea and vomiting.  Genitourinary:  Negative for dysuria and frequency.  Musculoskeletal:  Negative for joint pain and myalgias.  Skin:  Negative for rash.       (+)mass on right shoulder  Neurological:  Negative for headaches.  Psychiatric/Behavioral:  Negative for depression. The patient is not nervous/anxious.       Objective:    Physical Exam Constitutional:      General: She is not in acute distress.    Appearance: Normal appearance. She is not ill-appearing.  HENT:     Head: Normocephalic and atraumatic.     Right Ear: Tympanic membrane, ear canal and external ear normal.     Left Ear: Tympanic membrane, ear canal and external ear normal.  Eyes:     Extraocular Movements: Extraocular movements intact.     Pupils: Pupils are equal, round, and reactive to light.     Comments: No nystagmus  Cardiovascular:     Rate and Rhythm: Regular rhythm.     Pulses: Normal pulses.     Heart sounds: Normal heart sounds. No murmur heard.   No gallop.  Pulmonary:     Effort: Pulmonary effort is normal. No respiratory distress.     Breath sounds: Normal breath sounds. No wheezing or rales.  Chest:  Breasts:    Right: Normal.     Left: Normal.  Abdominal:     General: Bowel sounds are normal. There is no distension.     Palpations: Abdomen is soft. There is no mass.     Tenderness: There is no abdominal tenderness. There is no guarding or rebound.  Musculoskeletal:     Comments:  5/5 strength in both upper and lower extremities  Lymphadenopathy:     Cervical: No cervical adenopathy.  Skin:    General: Skin is warm and dry.     Comments: Pea sized sebaceous cyst right shoulder, non-tender, firm   Neurological:     Mental Status: She is alert and oriented to person, place, and  time.     Deep Tendon Reflexes:     Reflex Scores:      Patellar reflexes are 2+ on the right side and 2+ on the left side. Psychiatric:        Behavior: Behavior normal.    BP 127/67 (BP Location: Right Arm, Patient Position: Sitting, Cuff Size: Small)   Pulse 62   Temp 98.5 F (36.9 C) (Oral)   Resp 16   Ht 5' 10" (1.778 m)   Wt 174 lb (78.9 kg)   SpO2 100%   BMI 24.97 kg/m  Wt Readings from Last 3 Encounters:  09/28/20 174 lb (78.9 kg)  09/23/20 177 lb (80.3 kg)  09/09/20 175 lb (79.4 kg)       Assessment & Plan:   Problem List Items Addressed This Visit       Unprioritized   Preventative health care - Primary   Relevant Orders   MM 3D SCREEN BREAST BILATERAL   HTN (hypertension)   Relevant Orders   Comp Met (CMET)   Other Visit Diagnoses     Right hip pain       Relevant Orders   Ambulatory referral to Sports Medicine        No orders of the defined types were placed in this encounter.   I, Debbrah Alar NP, personally preformed the services described in this documentation.  All medical record entries made by the scribe were at my direction and in my presence.  I have reviewed the chart and discharge instructions (if applicable) and agree that the record reflects my personal performance and is accurate and complete. 09/28/2020   I,Shehryar Baig,acting as a Education administrator for Nance Pear, NP.,have documented all relevant documentation on the behalf of Nance Pear, NP,as directed by  Nance Pear, NP while in the presence of Nance Pear, NP.   Nance Pear, NP

## 2020-09-28 NOTE — Telephone Encounter (Signed)
Patient was referred b Dr.O'Sullivan for hip pain.  --Called pt to schedule appt,but she request Provider contact her 1st via [place] prior to scheduling Sports Med appt(has some questions).  --Forwarding message to Dr. Raeford Razor.  --glh

## 2020-09-28 NOTE — Patient Instructions (Signed)
Please complete lab work prior to leaving.  Keep up the good work with health diet, exercise.

## 2020-09-28 NOTE — Assessment & Plan Note (Signed)
Offered referral to derm for excision. She will think about it.

## 2020-09-28 NOTE — Assessment & Plan Note (Signed)
Encouraged pt to continue healthy diet, exercise. Advised her that I think it would be helpful for her to start the statin rx'd by cardiology. Recommended that she try to keep wine consumption to 1 or less drinks/day. Recommended covid booster this fall. Pt to schedule mammogram. Colo will be due in November and she plans to call GI at that time.

## 2020-09-29 NOTE — Telephone Encounter (Signed)
Left VM for patient. If she calls back please have her speak with a nurse/CMA and ask what questions she had about her hip.   If any questions then please take the best time and phone number to call and I will try to call her back.   Rosemarie Ax, MD [facility] 09/29/2020, 9:00 AM

## 2020-10-04 ENCOUNTER — Encounter: Payer: Self-pay | Admitting: Family

## 2020-10-04 DIAGNOSIS — N390 Urinary tract infection, site not specified: Secondary | ICD-10-CM

## 2020-10-05 ENCOUNTER — Other Ambulatory Visit: Payer: No Typology Code available for payment source

## 2020-10-05 DIAGNOSIS — N390 Urinary tract infection, site not specified: Secondary | ICD-10-CM

## 2020-10-05 MED ORDER — CEPHALEXIN 500 MG PO CAPS: 500.0000 mg | ORAL_CAPSULE | Freq: Three times a day (TID) | ORAL | 0 refills | Status: DC

## 2020-10-10 ENCOUNTER — Ambulatory Visit: Payer: No Typology Code available for payment source | Admitting: Family Medicine

## 2020-10-10 ENCOUNTER — Other Ambulatory Visit: Payer: Self-pay

## 2020-10-10 VITALS — Ht 70.0 in | Wt 175.0 lb

## 2020-10-10 DIAGNOSIS — G5701 Lesion of sciatic nerve, right lower limb: Secondary | ICD-10-CM | POA: Diagnosis not present

## 2020-10-10 MED ORDER — PREDNISONE 5 MG PO TABS: ORAL_TABLET | ORAL | 0 refills | Status: DC

## 2020-10-10 NOTE — Progress Notes (Signed)
Lauren Walters - 60 y.o. female MRN [id number]  Date of birth: 09-17-1960  SUBJECTIVE:  Including CC & ROS.  No chief complaint on file.   Lauren Walters is a 60 y.o. female that is presenting with right posterior hip pain and radicular pain down the right leg.  Seem to be worse after she was walking at the beach.  Does have a history of anterior right total hip arthroplasty.  No swelling or ecchymosis.  Review of the pelvis x-ray from 2018 shows no acute changes.   Review of Systems See HPI   HISTORY: Past Medical, Surgical, Social, and Family History Reviewed & Updated per EMR.   Pertinent Historical Findings include:  Past Medical History:  Diagnosis Date   Adhesive capsulitis of right shoulder 09/24/2016   Arthritis    Atypical chest pain 11/11/2015   DEGENERATIVE JOINT DISEASE, RIGHT HIP 11/11/2008   Qualifier: Diagnosis of  By: Oneida Alar MD, KARL     GERD (gastroesophageal reflux disease)    History of chicken pox    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    HTN (hypertension) 03/05/2015   Inclusion cyst 02/17/2018   Left knee pain 09/24/2016   Neck pain 01/30/2015   Osteopenia    Physical exam 12/18/2013   Postmenopausal HRT (hormone replacement therapy) 07/17/2013   Preventative health care 01/30/2015   Skin tag 04/24/2011   Tinnitus of left ear 09/27/2017   Last Assessment & Plan:  Formatting of this note might be different from the original. Concern over ringing at the left ear. 28-monthhistory.  No associated vertigo.  Some associated ear fullness.  No history of ear trauma surgery or infection.  No major history of noise exposure. EXAM shows normal external canals and tympanic membranes. PLAN: We discussed probably there is some inflammation.  We    Varicose veins 04/24/2011    Past Surgical History:  Procedure Laterality Date   CESAREAN SECTION  1995   CESAREAN SECTION  1997   CHOLECYSTECTOMY  1991   JOINT REPLACEMENT      R hip   OVARIAN CYST REMOVAL  1990   ROOT CANAL     TOTAL HIP ARTHROPLASTY  2011   WISDOM TOOTH EXTRACTION      Family History  Problem Relation Age of Onset   Dementia Mother    Hypertension Mother    CVA Mother    Aortic stenosis Mother    Alcohol abuse Father        deceased   Cancer Father        prostate, basal cell    Arthritis Father    Heart disease Father    Lymphoma Brother    Heart disease Maternal Grandfather    Arthritis Paternal Grandfather    Heart disease Paternal Grandfather    Colon cancer Neg Hx     Social History   Socioeconomic History   Marital status: Married    Spouse name: Not on file   Number of children: Not on file   Years of education: Not on file   Highest education level: Not on file  Occupational History   Not on file  Tobacco Use   Smoking status: Former    Types: Cigarettes    Quit date: 05/17/2015    Years since quitting: 5.4   Smokeless tobacco: Never  Substance and Sexual Activity   Alcohol use: Yes    Alcohol/week: 14.0 standard  drinks    Types: 14 Glasses of wine per week    Comment: 2 glasses of wine a night   Drug use: No   Sexual activity: Yes    Partners: Male  Other Topics Concern   Not on file  Social History Narrative   80- daughter   1997-son   1998- daughter      Youngest at home   Married   Biking/tennis/swimming, reading   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 Weakness with hip abduction. Normal strength with hip  flexion. Neurovascular intact     ASSESSMENT & PLAN:   Piriformis syndrome of right side Symptoms seem more consistent with piriformis and in a surgically replaced hip.  She did have an anterior approach.  Does have some tenderness over the SI joint. -Counseled on home exercise therapy and supportive care. -Prednisone. -Could consider injection or physical therapy.

## 2020-10-10 NOTE — Assessment & Plan Note (Signed)
Symptoms seem more consistent with piriformis and in a surgically replaced hip.  She did have an anterior approach.  Does have some tenderness over the SI joint. -Counseled on home exercise therapy and supportive care. -Prednisone. -Could consider injection or physical therapy.

## 2020-10-10 NOTE — Patient Instructions (Signed)
Good to see you Please try heat  Please try the exercises   Please send me a message in MyChart with any questions or updates.  Please see me back in 4 weeks .   --Dr. Shawni Volkov  

## 2020-10-11 ENCOUNTER — Encounter: Payer: Self-pay | Admitting: Family

## 2020-10-11 ENCOUNTER — Encounter: Payer: Self-pay | Admitting: Family Medicine

## 2020-10-11 LAB — URINE CULTURE
MICRO NUMBER:: 12225144
SPECIMEN QUALITY:: ADEQUATE

## 2020-10-13 ENCOUNTER — Ambulatory Visit: Payer: No Typology Code available for payment source | Admitting: Family Medicine

## 2020-10-20 MED ORDER — SULFAMETHOXAZOLE-TRIMETHOPRIM 400-80 MG PO TABS: 1.0000 | ORAL_TABLET | Freq: Two times a day (BID) | ORAL | 0 refills | Status: DC

## 2020-10-20 NOTE — Addendum Note (Signed)
Addended by: Debbrah Alar on: 10/20/2020 10:58 AM   Modules accepted: Orders

## 2020-11-02 ENCOUNTER — Telehealth (HOSPITAL_COMMUNITY): Payer: Self-pay | Admitting: *Deleted

## 2020-11-02 NOTE — Telephone Encounter (Signed)
Left message on voicemail per DPR in reference to upcoming appointment scheduled on 11/09/20 at 1400 with detailed instructions given per Stress Test Requisition Sheet for the test. LM to arrive 30 minutes early, and that it is imperative to arrive on time for appointment to keep from having the test rescheduled. If you need to cancel or reschedule your appointment, please call the office within 24 hours of your appointment. Failure to do so may result in a cancellation of your appointment, and a $50 no show fee. Phone number given for call back for any questions. Jakota Manthei, Ranae Palms

## 2020-11-07 ENCOUNTER — Ambulatory Visit: Payer: No Typology Code available for payment source | Admitting: Family Medicine

## 2020-11-09 ENCOUNTER — Ambulatory Visit (HOSPITAL_COMMUNITY): Payer: PRIVATE HEALTH INSURANCE | Attending: Cardiovascular Disease

## 2020-11-09 ENCOUNTER — Ambulatory Visit (HOSPITAL_COMMUNITY): Payer: PRIVATE HEALTH INSURANCE

## 2020-11-09 ENCOUNTER — Other Ambulatory Visit: Payer: Self-pay

## 2020-11-09 DIAGNOSIS — R0789 Other chest pain: Secondary | ICD-10-CM | POA: Diagnosis present

## 2020-11-09 LAB — ECHOCARDIOGRAM STRESS TEST: Area-P 1/2: 3.31 cm2

## 2020-11-09 MED ORDER — PERFLUTREN LIPID MICROSPHERE
1.0000 mL | INTRAVENOUS | Status: AC | PRN
  Administered 2020-11-09: 3 mL via INTRAVENOUS

## 2020-11-10 ENCOUNTER — Telehealth: Payer: Self-pay

## 2020-11-10 NOTE — Telephone Encounter (Signed)
Pt states she would like to see someone at the [address] office as it is only 4 minutes from her home. Pt was sent to [facility] as urgent referral. How do you advise?

## 2020-11-10 NOTE — Telephone Encounter (Signed)
Pt was offered an appointment but she declined stating she would like to be seen in [place].

## 2020-11-10 NOTE — Telephone Encounter (Signed)
-----   Message from Jenean Lindau, MD sent at 11/09/2020  5:17 PM EDT ----- Please bring her in to discuss results of the stress test ----- Message ----- From: Interface, [employer] Sent: 11/09/2020   3:26 PM EDT To: Jenean Lindau, MD

## 2020-11-14 ENCOUNTER — Ambulatory Visit (HOSPITAL_BASED_OUTPATIENT_CLINIC_OR_DEPARTMENT_OTHER): Payer: No Typology Code available for payment source

## 2020-11-21 ENCOUNTER — Other Ambulatory Visit: Payer: Self-pay | Admitting: Family

## 2020-11-23 NOTE — Progress Notes (Signed)
Cardiology Office Note:   Date:  11/24/2020  NAME:  Lauren Walters    MRN: [id number] DOB:  [birth year]   PCP:  Debbrah Alar, NP  Cardiologist:  None  Electrophysiologist:  None   Referring MD: Debbrah Alar, NP   Chief Complaint  Patient presents with   Follow-up   History of Present Illness:   Lauren Walters is a 60 y.o. female with a hx of HTN, GERD who presents for follow-up. Seen in July in [facility] for atypical CP. Exercise stress echo was inconclusive as images were performed at submaximal heart rate. Seen in ER in July for chest pain. CT PE study negative. No coronary calcium.  She reports she had COVID in March.  Apparently had a constellation of symptoms including intermittent dizziness and lightheadedness.  She also had constant achy chest pain.  The pain would come and go.  She reports not exertional.  Alleviated by rest.  Would occur randomly.  Her symptoms have resolved.  She did see cardiology and underwent an exercise stress echo.  This was labeled as inconclusive.  I did review this and this is likely just a normal study.  She reports her chest pain symptoms again have resolved.  She walks up to 4 miles per day without any chest pain or trouble breathing.  Her blood pressure is a bit elevated today but she takes medication for that.  It has been well controlled on other values.  She was noted to have calcium in her aorta and started on Lipitor 10 mg daily.  I did review her most recent lipid profile which shows an HDL cholesterol 108.  I informed her that it is necessary to continue taking Lipitor.  She is a former smoker.  She smoked for 30 years.  She drinks alcohol in moderation.  No drug use is reported.  She has 3 children.  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  Father had a pacemaker.  She does describe a nephew who had ARVD.  I did review her echocardiogram that shows normal RV function and size.  T chol 228, HDL 108, LDL 109, TG 61  Past Medical  History: Past Medical History:  Diagnosis Date   Adhesive capsulitis of right shoulder 09/24/2016   Arthritis    Atypical chest pain 11/11/2015   DEGENERATIVE JOINT DISEASE, RIGHT HIP 11/11/2008   Qualifier: Diagnosis of  By: Oneida Alar MD, KARL     GERD (gastroesophageal reflux disease)    History of chicken pox    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    HTN (hypertension) 03/05/2015   Inclusion cyst 02/17/2018   Left knee pain 09/24/2016   Neck pain 01/30/2015   Osteopenia    Physical exam 12/18/2013   Postmenopausal HRT (hormone replacement therapy) 07/17/2013   Preventative health care 01/30/2015   Skin tag 04/24/2011   Tinnitus of left ear 09/27/2017   Last Assessment & Plan:  Formatting of this note might be different from the original. Concern over ringing at the left ear. 2-month history.  No associated vertigo.  Some associated ear fullness.  No history of ear trauma surgery or infection.  No major history of noise exposure. EXAM shows normal external canals and tympanic membranes. PLAN: We discussed probably there is some inflammation.  We    Varicose veins 04/24/2011    Past Surgical History: Past Surgical History:  Procedure Laterality Date  [place]   CHOLECYSTECTOMY  1991   JOINT REPLACEMENT     R hip   OVARIAN CYST REMOVAL  1990   ROOT CANAL     TOTAL HIP ARTHROPLASTY  2011   WISDOM TOOTH EXTRACTION      Current Medications: Current Meds  Medication Sig   amoxicillin (AMOXIL) 500 MG capsule Take 2000mg  by mouth once prior to dental work   betamethasone valerate ointment (VALISONE) 0.1 % Apply 1 application topically 2 (two) times daily.   Calcium Carbonate-Vitamin D 600-400 MG-UNIT chew tablet Chew 1 tablet by mouth 2 (two) times daily.   cetirizine (ZYRTEC) 10 MG tablet Take 1 tablet (10 mg total) by mouth daily.   hydrochlorothiazide (HYDRODIURIL) 25 MG tablet TAKE ONE  TABLET BY MOUTH DAILY   lisinopril (ZESTRIL) 5 MG tablet TAKE ONE TABLET BY MOUTH DAILY   omeprazole (PRILOSEC) 40 MG capsule TAKE ONE CAPSULE BY MOUTH DAILY   [DISCONTINUED] atorvastatin (LIPITOR) 10 MG tablet Take 1 tablet (10 mg total) by mouth daily.     Allergies:    Metoprolol tartrate   Social History: Social History   Socioeconomic History   Marital status: Married    Spouse name: Not on file   Number of children: 3   Years of education: Not on file   Highest education level: Not on file  Occupational History   Occupation: Medical Coder    Comment: [employer]  Tobacco Use   Smoking status: Former    Packs/day: 0.50    Years: 30.00    Pack years: 15.00    Types: Cigarettes    Quit date: 05/17/2015    Years since quitting: 5.5   Smokeless tobacco: Never  Substance and Sexual Activity   Alcohol use: Yes    Alcohol/week: 14.0 standard drinks    Types: 14 Glasses of wine per week    Comment: 2 glasses of wine a night   Drug use: No   Sexual activity: Yes    Partners: Male  Other Topics Concern   Not on file  Social History Narrative   27- daughter   1997-son   1998- daughter      Youngest at home   Married   Biking/tennis/swimming, reading   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 There is no history of Colon cancer.  ROS:   All other ROS reviewed and negative. Pertinent  positives noted in the HPI.     EKGs/Labs/Other Studies Reviewed:   The following studies were personally reviewed by me today:  Exercise Echo 11/09/2020   1. Suggest alternative imaging ( Exercise myoview) to evaluate for  ischemia.   2. The peak exercise images were performed at submaximal HR . There was  no change in LV contractility at "peak" imaging.   3. There were no ST or T wave changes on the ECG portion of the  stress  test to suggest ischemia.   4. This is an inconclusive stress echocardiogram for ischemia.   5. This is an indeterminate risk study.   Recent Labs: 09/04/2020: Hemoglobin 13.2; Platelets 287 09/28/2020: ALT 16; BUN 11; Creatinine, Ser 0.65; Potassium 4.2; Sodium 137   Recent Lipid Panel    Component Value Date/Time   CHOL 229 (H) 09/18/2019 0806   TRIG 61.0 09/18/2019 0806   HDL 108.00 09/18/2019 0806   CHOLHDL 2 09/18/2019 0806   VLDL 12.2 09/18/2019 0806   LDLCALC 109 (H) 09/18/2019 0806    Physical Exam:   VS:  BP (!) 160/78   Pulse 67   Ht 5\' 10"  (1.778 m)   Wt 174 lb 9.6 oz (79.2 kg)   SpO2 97%   BMI 25.05 kg/m    Wt Readings from Last 3 Encounters:  11/24/20 174 lb 9.6 oz (79.2 kg)  10/10/20 175 lb (79.4 kg)  09/28/20 174 lb (78.9 kg)    General: Well nourished, well developed, in no acute distress Head: Atraumatic, normal size  Eyes: PEERLA, EOMI  Neck: Supple, no JVD Endocrine: No thryomegaly Cardiac: Normal S1, S2; RRR; no murmurs, rubs, or gallops Lungs: Clear to auscultation bilaterally, no wheezing, rhonchi or rales  Abd: Soft, nontender, no hepatomegaly  Ext: No edema, pulses 2+ Musculoskeletal: No deformities, BUE and BLE strength normal and equal Skin: Warm and dry, no rashes   Neuro: Alert and oriented to person, place, time, and situation, CNII-XII grossly intact, no focal deficits  Psych: Normal mood and affect   ASSESSMENT:   Lauren Walters is a 60 y.o. female who presents for the following: 1. Atypical chest pain    2. Primary hypertension   3. Mixed hyperlipidemia     PLAN:   1. Atypical chest pain -She had noncardiac chest pain in July 2022.  This is related to COVID.  EKG is normal.  She did visit the ER and had a CT PE study that showed no evidence of coronary calcium.  She underwent an exercise stress echo which was read as inconclusive.  On my review this is normal.  She has normal LV and RV function.  To me she is very low risk for any cardiac issues.  She is exercising up to 4 miles per day without any significant symptoms.  Her chest pain symptoms have resolved.  I really see no need for further cardiac evaluation given resolution of symptoms.  Regarding the Lipitor I see this is unnecessary.  She has trace calcium in her aorta.  She has 0 coronary calcium.  She has an HDL of 108.  She will stop Lipitor.  She will continue with diet and exercise.  We will see her back as needed.  2. Primary hypertension -Continue current medications.  3. Mixed hyperlipidemia -Stop Lipitor.  She has exceedingly high HDL cholesterol.  She does request to recheck her cholesterol as we have not had an updated one this year.  We will do this that she is fasting today.  She will see [place] as needed.  Disposition: Return if symptoms worsen or fail to improve.  Medication Adjustments/Labs and Tests Ordered: Current medicines are reviewed at length with the patient today.  Concerns regarding medicines are outlined above.  Orders Placed This Encounter  Procedures   Lipid panel    No orders of the defined types were placed in this encounter.   Patient Instructions  Medication Instructions:  Stop LIPITOR   *If you need  a refill on your cardiac medications before your next appointment, please call your pharmacy*   Lab Work: LIPID today   If you have labs (blood work) drawn today and your tests are completely normal, you will receive your results only by: [place] (if you have MyChart) OR A paper copy in  the mail If you have any lab test that is abnormal or we need to change your treatment, we will call you to review the results.   Follow-Up: At [facility], you and your health needs are our priority.  As part of our continuing mission to provide you with exceptional heart care, we have created designated Provider Care Teams.  These Care Teams include your primary Cardiologist (physician) and Advanced Practice Providers (APPs -  Physician Assistants and Nurse Practitioners) who all work together to provide you with the care you need, when you need it.  We recommend signing up for the patient portal called "MyChart".  Sign up information is provided on this After Visit Summary.  MyChart is used to connect with patients for Virtual Visits (Telemedicine).  Patients are able to view lab/test results, encounter notes, upcoming appointments, etc.  Non-urgent messages can be sent to your provider as well.   To learn more about what you can do with MyChart, go to [email or web address].    Your next appointment:   As needed  The format for your next appointment:   In Person  Provider:   Eleonore Chiquito, MD      Time Spent with Patient: I have spent a total of 35 minutes with patient reviewing hospital notes, telemetry, EKGs, labs and examining the patient as well as establishing an assessment and plan that was discussed with the patient.  > 50% of time was spent in direct patient care.  Signed, Addison Naegeli. Audie Box, MD, [place]  [address] [place], [place] [address] [phone]  11/24/2020 9:24 AM

## 2020-11-24 ENCOUNTER — Other Ambulatory Visit: Payer: Self-pay

## 2020-11-24 ENCOUNTER — Encounter: Payer: Self-pay | Admitting: Cardiovascular Disease

## 2020-11-24 ENCOUNTER — Ambulatory Visit: Payer: No Typology Code available for payment source | Admitting: Cardiovascular Disease

## 2020-11-24 VITALS — BP 160/78 | HR 67 | Ht 70.0 in | Wt 174.6 lb

## 2020-11-24 DIAGNOSIS — E782 Mixed hyperlipidemia: Secondary | ICD-10-CM

## 2020-11-24 DIAGNOSIS — R0789 Other chest pain: Secondary | ICD-10-CM | POA: Diagnosis not present

## 2020-11-24 DIAGNOSIS — I10 Essential (primary) hypertension: Secondary | ICD-10-CM

## 2020-11-24 LAB — LIPID PANEL
Chol/HDL Ratio: 1.7 ratio (ref 0.0–4.4)
Cholesterol, Total: 192 mg/dL (ref 100–199)
HDL: 114 mg/dL (ref >39)
LDL Chol Calc (NIH): 67 mg/dL (ref 0–99)
Triglycerides: 60 mg/dL (ref 0–149)
VLDL Cholesterol Cal: 11 mg/dL (ref 5–40)

## 2020-11-24 NOTE — Patient Instructions (Signed)
Medication Instructions:  Stop LIPITOR   *If you need a refill on your cardiac medications before your next appointment, please call your pharmacy*   Lab Work: LIPID today   If you have labs (blood work) drawn today and your tests are completely normal, you will receive your results only by: [place] (if you have MyChart) OR A paper copy in the mail If you have any lab test that is abnormal or we need to change your treatment, we will call you to review the results.   Follow-Up: At [facility], you and your health needs are our priority.  As part of our continuing mission to provide you with exceptional heart care, we have created designated Provider Care Teams.  These Care Teams include your primary Cardiologist (physician) and Advanced Practice Providers (APPs -  Physician Assistants and Nurse Practitioners) who all work together to provide you with the care you need, when you need it.  We recommend signing up for the patient portal called "MyChart".  Sign up information is provided on this After Visit Summary.  MyChart is used to connect with patients for Virtual Visits (Telemedicine).  Patients are able to view lab/test results, encounter notes, upcoming appointments, etc.  Non-urgent messages can be sent to your provider as well.   To learn more about what you can do with MyChart, go to [email or web address].    Your next appointment:   As needed  The format for your next appointment:   In Person  Provider:   Eleonore Chiquito, MD

## 2020-12-09 ENCOUNTER — Ambulatory Visit: Payer: No Typology Code available for payment source

## 2020-12-12 ENCOUNTER — Ambulatory Visit (HOSPITAL_BASED_OUTPATIENT_CLINIC_OR_DEPARTMENT_OTHER): Payer: No Typology Code available for payment source

## 2020-12-27 ENCOUNTER — Other Ambulatory Visit: Payer: Self-pay | Admitting: Family

## 2021-01-30 ENCOUNTER — Ambulatory Visit (HOSPITAL_BASED_OUTPATIENT_CLINIC_OR_DEPARTMENT_OTHER)
Admission: RE | Admit: 2021-01-30 | Discharge: 2021-01-30 | Disposition: A | Discharge status: Home / Self Care | Payer: No Typology Code available for payment source | Source: Ambulatory Visit | Attending: Family | Admitting: Family

## 2021-01-30 ENCOUNTER — Other Ambulatory Visit: Payer: Self-pay

## 2021-01-30 ENCOUNTER — Encounter (HOSPITAL_BASED_OUTPATIENT_CLINIC_OR_DEPARTMENT_OTHER): Payer: Self-pay

## 2021-01-30 DIAGNOSIS — Z1231 Encounter for screening mammogram for malignant neoplasm of breast: Secondary | ICD-10-CM | POA: Diagnosis present

## 2021-01-30 DIAGNOSIS — Z0000 Encounter for general adult medical examination without abnormal findings: Secondary | ICD-10-CM

## 2021-02-27 ENCOUNTER — Encounter: Payer: Self-pay | Admitting: Family

## 2021-02-27 DIAGNOSIS — Z1211 Encounter for screening for malignant neoplasm of colon: Secondary | ICD-10-CM

## 2021-03-10 ENCOUNTER — Ambulatory Visit: Payer: No Typology Code available for payment source | Admitting: Cardiology

## 2021-03-16 ENCOUNTER — Encounter: Payer: Self-pay | Admitting: Gastroenterology

## 2021-03-31 ENCOUNTER — Encounter: Payer: Self-pay | Admitting: Family

## 2021-03-31 ENCOUNTER — Ambulatory Visit (INDEPENDENT_AMBULATORY_CARE_PROVIDER_SITE_OTHER): Payer: PRIVATE HEALTH INSURANCE | Admitting: Family

## 2021-03-31 VITALS — BP 140/80 | HR 65 | Temp 97.9°F | Ht 70.0 in | Wt 180.4 lb

## 2021-03-31 DIAGNOSIS — K219 Gastro-esophageal reflux disease without esophagitis: Secondary | ICD-10-CM

## 2021-03-31 DIAGNOSIS — I10 Essential (primary) hypertension: Secondary | ICD-10-CM

## 2021-03-31 DIAGNOSIS — L570 Actinic keratosis: Secondary | ICD-10-CM

## 2021-03-31 LAB — BASIC METABOLIC PANEL
BUN: 13 mg/dL (ref 6–23)
CO2: 33 mEq/L — ABNORMAL HIGH (ref 19–32)
Calcium: 10 mg/dL (ref 8.4–10.5)
Chloride: 98 mEq/L (ref 96–112)
Creatinine, Ser: 0.71 mg/dL (ref 0.40–1.20)
GFR: 91.01 mL/min (ref >60.00)
Glucose, Bld: 91 mg/dL (ref 70–99)
Potassium: 4.4 mEq/L (ref 3.5–5.1)
Sodium: 138 mEq/L (ref 135–145)

## 2021-03-31 NOTE — Assessment & Plan Note (Signed)
bp at goal. Continue lisinopril and htz. Obtain follow up bmet.

## 2021-03-31 NOTE — Patient Instructions (Signed)
Please complete lab work prior to leaving.   

## 2021-03-31 NOTE — Assessment & Plan Note (Signed)
New. Benign appearing. Reassurance provided.

## 2021-03-31 NOTE — Progress Notes (Signed)
Subjective:     Patient ID: Lauren Walters, female    DOB: March 27, 1960, 61 y.o.   MRN: [id number]  Chief Complaint  Patient presents with   Medical Management of Chronic Issues    6 mf/u    something on her back that has a flap    Pt reports that it is itchy.     HPI Patient is in today for follow up.  Hypertension-  BP Readings from Last 3 Encounters:  03/31/21 140/80  11/24/20 (!) 160/78  11/09/20 129/68   Maintained on hctz 25mg , lisinopril 5mg .  GERD- continues omeprazole. Takes daily.  Notes that she stopped for a few weeks   Had flu shot.  Will send [place] date.    Colonoscopy is scheduled for 05/05/2021  Health Maintenance Due  Topic Date Due   INFLUENZA VACCINE  09/26/2020   COLONOSCOPY (Pts 45-61yrs Insurance coverage will need to be confirmed)  01/31/2021   COVID-19 Vaccine (4 - Booster for Pfizer series) 02/04/2021    Past Medical History:  Diagnosis Date   Adhesive capsulitis of right shoulder 09/24/2016   Arthritis    Atypical chest pain 11/11/2015   DEGENERATIVE JOINT DISEASE, RIGHT HIP 11/11/2008   Qualifier: Diagnosis of  By: Oneida Alar MD, KARL     GERD (gastroesophageal reflux disease)    History of chicken pox    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    HTN (hypertension) 03/05/2015   Inclusion cyst 02/17/2018   Left knee pain 09/24/2016   Neck pain 01/30/2015   Osteopenia    Physical exam 12/18/2013   Postmenopausal HRT (hormone replacement therapy) 07/17/2013   Preventative health care 01/30/2015   Skin tag 04/24/2011   Tinnitus of left ear 09/27/2017   Last Assessment & Plan:  Formatting of this note might be different from the original. Concern over ringing at the left ear. 13-month history.  No associated vertigo.  Some associated ear fullness.  No history of ear trauma surgery or infection.  No major history of noise exposure. EXAM shows normal external canals and tympanic membranes. PLAN: We  discussed probably there is some inflammation.  We    Varicose veins 04/24/2011    Past Surgical History:  Procedure Laterality Date   CESAREAN SECTION  1995   CESAREAN SECTION  1997   CHOLECYSTECTOMY  1991   JOINT REPLACEMENT     R hip   OVARIAN CYST REMOVAL  1990   ROOT CANAL     TOTAL HIP ARTHROPLASTY  2011   WISDOM TOOTH EXTRACTION      Family History  Problem Relation Age of Onset   Dementia Mother    Hypertension Mother    CVA Mother    Aortic stenosis Mother    Alcohol abuse Father        deceased   Cancer Father        prostate, basal cell    Arthritis Father    Heart disease Father    Lymphoma Brother    Heart disease Maternal Grandfather    Arthritis Paternal Grandfather    Heart disease Paternal Grandfather    Colon cancer Neg Hx     Social History   Socioeconomic History   Marital status: Married    Spouse name: Not on file   Number of children: 3   Years of education: Not on file   Highest education level: Not on file  Occupational  History   Occupation: Careers information officer    Comment: [employer]  Tobacco Use   Smoking status: Former    Packs/day: 0.50    Years: 30.00    Pack years: 15.00    Types: Cigarettes    Quit date: 05/17/2015    Years since quitting: 5.8   Smokeless tobacco: Never  Substance and Sexual Activity   Alcohol use: Yes    Alcohol/week: 14.0 standard drinks    Types: 14 Glasses of wine per week    Comment: 2 glasses of wine a night   Drug use: No   Sexual activity: Yes    Partners: Male  Other Topics Concern   Not on file  Social History Narrative   65- daughter   1997-son   1998- daughter      Youngest at home   Married   Biking/tennis/swimming, reading   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 30 g 0   Calcium Carbonate-Vitamin D 600-400 MG-UNIT chew tablet Chew 1 tablet by mouth 2 (two) times daily.     cetirizine (ZYRTEC) 10 MG tablet Take 1 tablet (10 mg total) by mouth daily. 30 tablet 11   hydrochlorothiazide (HYDRODIURIL) 25 MG tablet TAKE ONE TABLET BY MOUTH DAILY 90 tablet 1   lisinopril (ZESTRIL) 5 MG tablet TAKE ONE TABLET BY MOUTH DAILY 90 tablet 1   omeprazole (PRILOSEC) 40 MG capsule TAKE ONE CAPSULE BY MOUTH DAILY 90 capsule 1   No facility-administered medications prior to visit.    Allergies  Allergen Reactions   Metoprolol Tartrate Palpitations and Other (See Comments)    Chest pain, increased palpitations    ROS See HPI    Objective:    Physical Exam Constitutional:      General: She is not in acute distress.    Appearance: Normal appearance. She is well-developed.  HENT:     Head: Normocephalic and atraumatic.     Right Ear: External ear normal.     Left Ear: External ear normal.  Eyes:     General: No scleral icterus. Neck:     Thyroid: No thyromegaly.  Cardiovascular:     Rate and Rhythm: Normal rate and regular rhythm.     Heart sounds: Normal heart sounds. No murmur heard. Pulmonary:     Effort: Pulmonary effort is normal. No respiratory distress.     Breath sounds: Normal breath sounds. No wheezing.  Musculoskeletal:     Cervical back: Neck supple.  Skin:    General: Skin is warm and dry.     Comments: Small light brown flat keratosis noted on left mid back  Neurological:     Mental Status: She is alert and oriented to person, place, and time.  Psychiatric:        Mood and Affect: Mood normal.        Behavior:  Behavior normal.        Thought Content: Thought content  normal.        Judgment: Judgment normal.    BP 140/80    Pulse 65    Temp 97.9 F (36.6 C) (Oral)    Ht 5\' 10"  (1.778 m)    Wt 180 lb 6.4 oz (81.8 kg)    SpO2 95%    BMI 25.88 kg/m  Wt Readings from Last 3 Encounters:  03/31/21 180 lb 6.4 oz (81.8 kg)  11/24/20 174 lb 9.6 oz (79.2 kg)  10/10/20 175 lb (79.4 kg)       Assessment & Plan:   Problem List Items Addressed This Visit       Unprioritized   Keratosis - Primary    New. Benign appearing. Reassurance provided.       HTN (hypertension)    bp at goal. Continue lisinopril and htz. Obtain follow up bmet.      Relevant Orders   Basic metabolic panel   GERD (gastroesophageal reflux disease)    Recommended that she try to change her omeprazole to PRN.        I am having Lauris A. Moody maintain her cetirizine, Calcium Carbonate-Vitamin D, betamethasone valerate ointment, amoxicillin, lisinopril, hydrochlorothiazide, and omeprazole.  No orders of the defined types were placed in this encounter.

## 2021-03-31 NOTE — Assessment & Plan Note (Signed)
Recommended that she try to change her omeprazole to PRN.

## 2021-04-17 ENCOUNTER — Encounter: Payer: Self-pay | Admitting: Family

## 2021-04-17 DIAGNOSIS — R159 Full incontinence of feces: Secondary | ICD-10-CM

## 2021-04-19 NOTE — Addendum Note (Signed)
Addended by: Debbrah Alar on: 04/19/2021 07:08 AM   Modules accepted: Orders

## 2021-04-20 ENCOUNTER — Ambulatory Visit (AMBULATORY_SURGERY_CENTER): Payer: PRIVATE HEALTH INSURANCE | Admitting: *Deleted

## 2021-04-20 ENCOUNTER — Other Ambulatory Visit: Payer: Self-pay

## 2021-04-20 VITALS — Ht 70.0 in | Wt 180.0 lb

## 2021-04-20 DIAGNOSIS — Z86010 Personal history of colonic polyps: Secondary | ICD-10-CM

## 2021-04-20 MED ORDER — NA SULFATE-K SULFATE-MG SULF 17.5-3.13-1.6 GM/177ML PO SOLN: 1.0000 | ORAL | 0 refills | Status: DC

## 2021-04-20 NOTE — Progress Notes (Signed)

## 2021-04-28 ENCOUNTER — Encounter: Payer: Self-pay | Admitting: Gastroenterology

## 2021-05-05 ENCOUNTER — Other Ambulatory Visit: Payer: Self-pay

## 2021-05-05 ENCOUNTER — Ambulatory Visit (AMBULATORY_SURGERY_CENTER): Payer: PRIVATE HEALTH INSURANCE | Admitting: Gastroenterology

## 2021-05-05 ENCOUNTER — Encounter: Payer: Self-pay | Admitting: Gastroenterology

## 2021-05-05 VITALS — BP 113/66 | HR 70 | Temp 97.7°F | Resp 13 | Ht 70.0 in | Wt 180.0 lb

## 2021-05-05 DIAGNOSIS — Z86010 Personal history of colonic polyps: Secondary | ICD-10-CM | POA: Diagnosis present

## 2021-05-05 DIAGNOSIS — K635 Polyp of colon: Secondary | ICD-10-CM | POA: Diagnosis not present

## 2021-05-05 MED ORDER — SODIUM CHLORIDE 0.9 % IV SOLN: 500.0000 mL | Freq: Once | INTRAVENOUS | Status: DC

## 2021-05-05 NOTE — Progress Notes (Signed)
Vitals-CW  Pt's states no medical or surgical changes since previsit or office visit. 

## 2021-05-05 NOTE — Progress Notes (Signed)
PT taken to PACU. Monitors in place. VSS. Report given to RN. 

## 2021-05-05 NOTE — Patient Instructions (Signed)
YOU HAD AN ENDOSCOPIC PROCEDURE TODAY AT THE [facility]:   Refer to the procedure report that was given to you for any specific questions about what was found during the examination.  If the procedure report does not answer your questions, please call your gastroenterologist to clarify.  If you requested that your care partner not be given the details of your procedure findings, then the procedure report has been included in a sealed envelope for you to review at your convenience later.  YOU SHOULD EXPECT: Some feelings of bloating in the abdomen. Passage of more gas than usual.  Walking can help get rid of the air that was put into your GI tract during the procedure and reduce the bloating. If you had a lower endoscopy (such as a colonoscopy or flexible sigmoidoscopy) you may notice spotting of blood in your stool or on the toilet paper. If you underwent a bowel prep for your procedure, you may not have a normal bowel movement for a few days.  Please Note:  You might notice some irritation and congestion in your nose or some drainage.  This is from the oxygen used during your procedure.  There is no need for concern and it should clear up in a day or so.  SYMPTOMS TO REPORT IMMEDIATELY:   Following lower endoscopy (colonoscopy or flexible sigmoidoscopy):  Excessive amounts of blood in the stool  Significant tenderness or worsening of abdominal pains  Swelling of the abdomen that is new, acute  Fever of 100F or higher  For urgent or emergent issues, a gastroenterologist can be reached at any hour by calling [phone]. Do not use MyChart messaging for urgent concerns.    DIET:  We do recommend a small meal at first, but then you may proceed to your regular diet.  Drink plenty of fluids but you should avoid alcoholic beverages for 24 hours.  ACTIVITY:  You should plan to take it easy for the rest of today and you should NOT DRIVE or use heavy machinery until tomorrow (because  of the sedation medicines used during the test).    FOLLOW UP: Our staff will call the number listed on your records 48-72 hours following your procedure to check on you and address any questions or concerns that you may have regarding the information given to you following your procedure. If we do not reach you, we will leave a message.  We will attempt to reach you two times.  During this call, we will ask if you have developed any symptoms of COVID 19. If you develop any symptoms (ie: fever, flu-like symptoms, shortness of breath, cough etc.) before then, please call [phone].  If you test positive for Covid 19 in the 2 weeks post procedure, please call and report this information to us.    If any biopsies were taken you will be contacted by phone or by letter within the next 1-3 weeks.  Please call us at [phone] if you have not heard about the biopsies in 3 weeks.    SIGNATURES/CONFIDENTIALITY: You and/or your care partner have signed paperwork which will be entered into your electronic medical record.  These signatures attest to the fact that that the information above on your After Visit Summary has been reviewed and is understood.  Full responsibility of the confidentiality of this discharge information lies with you and/or your care-partner. 

## 2021-05-05 NOTE — Progress Notes (Signed)
Called to room to assist during endoscopic procedure.  Patient ID and intended procedure confirmed with present staff. Received instructions for my participation in the procedure from the performing physician.  

## 2021-05-05 NOTE — Progress Notes (Signed)
[facility] History and Physical ? ? ?Primary Care Physician:  Debbrah Alar, NP ? ? ?Reason for Procedure:  History of adenomatous colon polyps ? ?Plan:    Surveillance colonoscopy with possible interventions as needed ? ? ? ? ?HPI: Lauren Walters is a very pleasant 61 y.o. female here for surveillance colonoscopy. ?Denies any nausea, vomiting, abdominal pain, melena or bright red blood per rectum ? ?The risks and benefits as well as alternatives of endoscopic procedure(s) have been discussed and reviewed. All questions answered. The patient agrees to proceed. ? ? ? ?Past Medical History:  ?Diagnosis Date  ? Adhesive capsulitis of right shoulder 09/24/2016  ? Arthritis   ? Atypical chest pain 11/11/2015  ? DEGENERATIVE JOINT DISEASE, RIGHT HIP 11/11/2008  ? Qualifier: Diagnosis of  By: Oneida Alar MD, KARL    ? GERD (gastroesophageal reflux disease)   ? History of chicken pox   ? HTLVTYPE I CCE & UNS SITE 07/08/2007  ? Qualifier: Diagnosis of  By: Larose Kells MD, [place]   ? HTN (hypertension) 03/05/2015  ? Inclusion cyst 02/17/2018  ? Left knee pain 09/24/2016  ? Neck pain 01/30/2015  ? Osteopenia   ? Physical exam 12/18/2013  ? Postmenopausal HRT (hormone replacement therapy) 07/17/2013  ? Preventative health care 01/30/2015  ? Skin tag 04/24/2011  ? Tinnitus of left ear 09/27/2017  ? Last Assessment & Plan:  Formatting of this note might be different from the original. Concern over ringing at the left ear. 36-monthhistory.  No associated vertigo.  Some associated ear fullness.  No history of ear trauma surgery or infection.  No major history of noise exposure. EXAM shows normal external canals and tympanic membranes. PLAN: We discussed probably there is some inflammation.  We   ? Varicose veins 04/24/2011  ? ? ?Past Surgical History:  ?Pro[place]ality Date  [place] ? CHOLECYSTECTOMY  1991  ? COLONOSCOPY  03/2015  ? Dr.Luwanda Starr  ? JOINT REPLACEMENT    ? [place]  ? OVARIAN CYST REMOVAL  1990  ? ROOT CANAL    ? TOTAL HIP ARTHROPLASTY  2011  ? WISDOM TOOTH EXTRACTION    ? ? ?Prior to Admission medications   ?Medication Sig Start Date End Date Taking? Authorizing Provider  ?betamethasone valerate ointment (VALISONE) 0.1 % Apply 1 application topically 2 (two) times daily. 09/18/19  Yes ODebbrah Alar NP  ?Calcium Carbonate-Vitamin D 600-400 MG-UNIT chew tablet Chew 1 tablet by mouth 2 (two) times daily. 09/02/18  Yes ODebbrah Alar NP  ?cetirizine (ZYRTEC) 10 MG tablet Take 1 tablet (10 mg total) by mouth daily. 08/02/17  Yes ODebbrah Alar NP  ?hydrochlorothiazide (HYDRODIURIL) 25 MG tablet TAKE ONE TABLET BY MOUTH DAILY 12/27/20  Yes ODebbrah Alar NP  ?lisinopril (ZESTRIL) 5 MG tablet TAKE ONE TABLET BY MOUTH DAILY 11/21/20  Yes ODebbrah Alar NP  ?amoxicillin (AMOXIL) 500 MG capsule Take '2000mg'$  by mouth once prior to dental work ?Patient not taking: Reported on 04/20/2021 10/26/19   ODebbrah Alar NP  ?omeprazole (PRILOSEC) 40 MG capsule TAKE ONE CAPSULE BY MOUTH DAILY 12/27/20   ODebbrah Alar NP  ? ? ?Current Outpatient Medications  ?Medication Sig Dispense Refill  ? betamethasone valerate ointment (VALISONE) 0.1 % Apply 1 application topically 2 (two) times daily. 30 g 0  ? Calcium Carbonate-Vitamin D 600-400 MG-UNIT chew tablet Chew 1 tablet by mouth 2 (two) times daily.    ? cetirizine (ZYRTEC) 10 MG  tablet Take 1 tablet (10 mg total) by mouth daily. 30 tablet 11  ? hydrochlorothiazide (HYDRODIURIL) 25 MG tablet TAKE ONE TABLET BY MOUTH DAILY 90 tablet 1  ? lisinopril (ZESTRIL) 5 MG tablet TAKE ONE TABLET BY MOUTH DAILY 90 tablet 1  ? amoxicillin (AMOXIL) 500 MG capsule Take '2000mg'$  by mouth once prior to dental work (Patient not taking: Reported on 04/20/2021) 4 capsule 5  ? omeprazole (PRILOSEC) 40 MG capsule TAKE ONE CAPSULE BY MOUTH DAILY 90 capsule 1  ? ?Current Facility-Administered Medications  ?Medication Dose Route Frequency  Provider Last Rate Last Admin  ? 0.9 %  sodium chloride infusion  500 mL Intravenous Once Haroon Shatto, Venia Minks, MD      ? ? ?Allergies as of 05/05/2021 - Review Complete 05/05/2021  ?Allergen Reaction Noted  ? Metoprolol tartrate Palpitations and Other (See Comments) 07/15/2015  ? ? ?Family History  ?Problem Relation Age of Onset  ? Dementia Mother   ? Hypertension Mother   ? CVA Mother   ? Aortic stenosis Mother   ? Alcohol abuse Father   ?     deceased  ? Cancer Father   ?     prostate, basal cell   ? Arthritis Father   ? Heart disease Father   ? Lymphoma Brother   ? Heart disease Maternal Grandfather   ? Arthritis Paternal Grandfather   ? Heart disease Paternal Grandfather   ? Colon cancer Neg Hx   ? Colon polyps Neg Hx   ? Esophageal cancer Neg Hx   ? Stomach cancer Neg Hx   ? Rectal cancer Neg Hx   ? ? ?Social History  ? ?Socioeconomic History  ? Marital status: Married  ?  Spouse name: Not on file  ? Number of children: 3  ? Years of education: Not on file  ? Highest education level: Not on file  ?Occupational History  ? Occupation: Medical Coder  ?  Comment: [employer]  ?Tobacco Use  ? Smoking status: Former  ?  Packs/day: 0.50  ?  Years: 30.00  ?  Pack years: 15.00  ?  Types: Cigarettes  ?  Quit date: 05/17/2015  ?  Years since quitting: 5.9  ? Smokeless tobacco: Never  ?Vaping Use  ? Vaping Use: Never used  ?Substance and Sexual Activity  ? Alcohol use: Yes  ?  Alcohol/week: 14.0 standard drinks  ?  Types: 14 Glasses of wine per week  ?  Comment: 2 glasses of wine a night  ? Drug use: No  ? Sexual activity: Yes  ?  Partners: Male  ?Other Topics Concern  ? Not on file  ?Social History Narrative  ? 55- daughter  ? 1997-son  ? 24- daughter  ?   ? Youngest at home  ? Married  ? Biking/tennis/swimming, reading  ? 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 ?Social Determinants of Health  ? ?Financial Resource Strain: Not on file  ?Food Insecurity: Not on file  ?Transportation Needs: Not on file  ?Physical Activity:  Not on file  ?Stress: Not on file  ?Social Connections: Not on file  ?Intimate Partner Violence: Not on file  ? ? ?Review of Systems: ? ?All other review of systems negative except as mentioned in the HPI. ? ?Physical Exam: ?Vital signs in last 24 hours: ?BP (!) 117/91 (BP Location: Right Arm, Patient Position: Sitting, Cuff Size: Normal)   Pulse (!) 58   Temp 97.7 ?F (36.5 ?C) (Temporal)   Ht 5'  10" (1.778 m)   Wt 180 lb (81.6 kg)   SpO2 99%   BMI 25.83 kg/m?  ?General:   Alert, NAD ?Lungs:  Clear .   ?Heart:  Regular rate and rhythm ?Abdomen:  Soft, nontender and nondistended. ?Neuro/Psych:  Alert and cooperative. Normal mood and affect. A and O x 3 ? ?Reviewed labs, radiology imaging, old records and pertinent past GI work up ? ?Patient is appropriate for planned procedure(s) and anesthesia in an ambulatory setting ? ? ?K. Denzil Magnuson , MD ?[phone]  ? ? ?  ?

## 2021-05-05 NOTE — Op Note (Signed)
[place] ?Patient Name: Lauren Walters ?Procedure Date: 05/05/2021 8:45 AM ?MRN: [id number] ?Endoscopist: Mauri Pole , MD ?Age: 60 ?Referring MD:  ?Date of Birth: 1960-10-24 ?Gender: Female ?Account #: [id number] ?Procedure:                Colonoscopy ?Indications:              High risk colon cancer surveillance: Personal  ?                          history of colonic polyps, High risk colon cancer  ?                          surveillance: Personal history of multiple (3 or  ?                          more) adenomas, High risk colon cancer  ?                          surveillance: Personal history of adenoma less than  ?                          10 mm in size, High risk colon cancer surveillance:  ?                          Personal history of sessile serrated colon polyp  ?                          (less than 10 mm in size) with no dysplasia ?Medicines:                Monitored Anesthesia Care ?Procedure:                Pre-Anesthesia Assessment: ?                          - Prior to the procedure, a History and Physical  ?                          was performed, and patient medications and  ?                          allergies were reviewed. The patient's tolerance of  ?                          previous anesthesia was also reviewed. The risks  ?                          and benefits of the procedure and the sedation  ?                          options and risks were discussed with the patient.  ?                          All questions were answered, and informed consent  ?  was obtained. Prior Anticoagulants: The patient has  ?                          taken no previous anticoagulant or antiplatelet  ?                          agents. ASA Grade Assessment: III - A patient with  ?                          severe systemic disease. After reviewing the risks  ?                          and benefits, the patient was deemed in  ?                          satisfactory  condition to undergo the procedure. ?                          After obtaining informed consent, the colonoscope  ?                          was passed under direct vision. Throughout the  ?                          procedure, the patient's blood pressure, pulse, and  ?                          oxygen saturations were monitored continuously. The  ?                          Olympus PCF-H190DL (#[id number]) Colonoscope was  ?                          introduced through the anus and advanced to the the  ?                          cecum, identified by appendiceal orifice and  ?                          ileocecal valve. The colonoscopy was performed  ?                          without difficulty. The patient tolerated the  ?                          procedure well. The quality of the bowel  ?                          preparation was adequate. The ileocecal valve,  ?                          appendiceal orifice, and rectum were photographed. ?Scope In: 8:51:54 AM ?Scope Out: 9:12:14 AM ?Scope Withdrawal Time: 0 hours 13 minutes 51 seconds  ?Total Procedure Duration: 0  hours 20 minutes 20 seconds  ?Findings:                 The perianal and digital rectal examinations were  ?                          normal. ?                          A 30 mm polyp was found in the cecum extending to  ?                          ascending colon. The polyp was flat, non-granular  ?                          lateral spreading. Polypectomy was not attempted  ?                          due to polyp size (too large to be excised). Area  ?                          was tattooed with an injection of 2 mL of Spot  ?                          (carbon black). Biopsies were taken with a cold  ?                          forceps for histology. ?                          Scattered small and large-mouthed diverticula were  ?                          found in the sigmoid colon, descending colon,  ?                          transverse colon and ascending colon. ?                           Non-bleeding external and internal hemorrhoids were  ?                          found during retroflexion. The hemorrhoids were  ?                          small. ?Complications:            No immediate complications. ?Estimated Blood Loss:     Estimated blood loss was minimal. ?Impression:               - One 30 mm polyp in the cecum. Resection not  ?                          attempted. Tattooed. Biopsied. ?                          -  Moderate diverticulosis in the sigmoid colon, in  ?                          the descending colon, in the transverse colon and  ?                          in the ascending colon. ?                          - Non-bleeding external and internal hemorrhoids. ?Recommendation:           - Patient has a contact number available for  ?                          emergencies. The signs and symptoms of potential  ?                          delayed complications were discussed with the  ?                          patient. Return to normal activities tomorrow.  ?                          Written discharge instructions were provided to the  ?                          patient. ?                          - Resume previous diet. ?                          - Continue present medications. ?                          - Await pathology results. ?                          - Repeat colonoscopy date to be determined after  ?                          pending pathology results are reviewed for  ?                          surveillance based on pathology results. ?Mauri Pole, MD ?05/05/2021 9:22:06 AM ?This report has been signed electronically. ?

## 2021-05-07 ENCOUNTER — Encounter: Payer: Self-pay | Admitting: Cardiovascular Disease

## 2021-05-09 ENCOUNTER — Telehealth: Payer: Self-pay

## 2021-05-09 NOTE — Telephone Encounter (Signed)
?  Follow up Call- ? ?Call back number 05/05/2021  ?Post procedure Call Back phone  # [phone]  ?Permission to leave phone message Yes  ?Some recent data might be hidden  ?  ? ?Patient questions: ? ?Do you have a fever, pain , or abdominal swelling? No. ?Pain Score  0 * ? ?Have you tolerated food without any problems? Yes.   ? ?Have you been able to return to your normal activities? Yes.   ? ?Do you have any questions about your discharge instructions: ?Diet   No. ?Medications  No. ?Follow up visit  No. ? ?Do you have questions or concerns about your Care? No. ? ?Actions: ?* If pain score is 4 or above: ?No action needed, pain <4. ? ? ?

## 2021-05-18 ENCOUNTER — Encounter: Payer: Self-pay | Admitting: Gastroenterology

## 2021-05-18 ENCOUNTER — Other Ambulatory Visit: Payer: Self-pay | Admitting: Family

## 2021-05-18 NOTE — Telephone Encounter (Signed)
I called patient and explained to her given the biopsy results from the flat polyp in the ascending colon/cecum was benign mucosa and not a sessile serrated adenoma, she does not need a follow-up colonoscopy for removal of the polyp and she is due for surveillance colonoscopy in 10 years. ?Beth, can you please request anesthesia to address her concerns regarding preop assessment ASA 3.  I am [place]. Thank you ?

## 2021-07-04 ENCOUNTER — Other Ambulatory Visit: Payer: Self-pay | Admitting: Family

## 2021-07-10 ENCOUNTER — Encounter: Payer: Self-pay | Admitting: Family Medicine

## 2021-07-10 ENCOUNTER — Encounter: Payer: Self-pay | Admitting: Family

## 2021-07-10 ENCOUNTER — Ambulatory Visit: Payer: PRIVATE HEALTH INSURANCE | Admitting: Family Medicine

## 2021-07-10 VITALS — BP 120/68 | HR 68 | Temp 97.6°F | Resp 18 | Ht 70.0 in | Wt 184.6 lb

## 2021-07-10 DIAGNOSIS — R350 Frequency of micturition: Secondary | ICD-10-CM | POA: Diagnosis not present

## 2021-07-10 DIAGNOSIS — N390 Urinary tract infection, site not specified: Secondary | ICD-10-CM | POA: Diagnosis not present

## 2021-07-10 DIAGNOSIS — R319 Hematuria, unspecified: Secondary | ICD-10-CM

## 2021-07-10 LAB — POC URINALSYSI DIPSTICK (AUTOMATED)
Bilirubin, UA: NEGATIVE
Glucose, UA: NEGATIVE
Ketones, UA: NEGATIVE
Nitrite, UA: POSITIVE
Protein, UA: NEGATIVE
Spec Grav, UA: 1.01 (ref 1.010–1.025)
Urobilinogen, UA: 0.2 E.U./dL
pH, UA: 6 (ref 5.0–8.0)

## 2021-07-10 MED ORDER — CEPHALEXIN 500 MG PO CAPS: 500.0000 mg | ORAL_CAPSULE | Freq: Two times a day (BID) | ORAL | 0 refills | Status: DC

## 2021-07-10 NOTE — Progress Notes (Addendum)
? ?Subjective:  ? ?By signing my name below, I, Lauren Walters, attest that this documentation has been prepared under the direction and in the presence of Lauren Schanz DO, 07/10/2021   ? ? Patient ID: Lauren Walters, female    DOB: 1960/10/24, 61 y.o.   MRN: [id number] ? ?Chief Complaint  ?Patient presents with  ? Flank Pain  ?  Left lower abdomen, Pt states noticing blood in her urin over the weekend. Pt states having frequancy.   ? ? ?HPI ?Patient is in today for an office visit ? ?She complains of UTI symptoms that begun last weekend. She states that she noticed symptoms such as hematuria. She used Azo treatment and felt better the morning of 07/09/2021. However, this morning on 07/10/2021 she experienced urinary frequency symptoms without much production. Over the past several weeks, she felt as though she cannot completely empty. She also notes discomfort in the left area of her lower abdomen. She denies of any blood in stool, or other bowel troubles. She is scheduled to see Dr. Wannetta Sender for urology concerns on 07/19/2021. ? ? ?Health Maintenance Due  ?Topic Date Due  ? COVID-19 Vaccine (4 - Booster for Pfizer series) 02/04/2021  ? ? ?Past Medical History:  ?Diagnosis Date  ? Adhesive capsulitis of right shoulder 09/24/2016  ? Arthritis   ? Atypical chest pain 11/11/2015  ? DEGENERATIVE JOINT DISEASE, RIGHT HIP 11/11/2008  ? Qualifier: Diagnosis of  By: Oneida Alar MD, KARL    ? GERD (gastroesophageal reflux disease)   ? History of chicken pox   ? HTLVTYPE I CCE & UNS SITE 07/08/2007  ? Qualifier: Diagnosis of  By: Larose Kells MD, [place]   ? HTN (hypertension) 03/05/2015  ? Inclusion cyst 02/17/2018  ? Left knee pain 09/24/2016  ? Neck pain 01/30/2015  ? Osteopenia   ? Physical exam 12/18/2013  ? Postmenopausal HRT (hormone replacement therapy) 07/17/2013  ? Preventative health care 01/30/2015  ? Skin tag 04/24/2011  ? Tinnitus of left ear 09/27/2017  ? Last Assessment & Plan:  Formatting of this note might be  different from the original. Concern over ringing at the left ear. 64-monthhistory.  No associated vertigo.  Some associated ear fullness.  No history of ear trauma surgery or infection.  No major history of noise exposure. EXAM shows normal external canals and tympanic membranes. PLAN: We discussed probably there is some inflammation.  We   ? Varicose veins 04/24/2011  ? ? ?Past Surgical History:  ?[place]  [place] ? [place]LE[place]ST[place]  1991  ? COLONOSCOPY  03/2015  ? Dr.Nandigam  ? JOINT REPLACEMENT    ? R hip  ? OVARIAN CYST REMOVAL  1990  ? ROOT CANAL    ? TOTAL HIP ARTHROPLASTY  2011  ? WISDOM TOOTH EXTRACTION    ? ? ?Family History  ?Problem Relation Age of Onset  ? Dementia Mother   ? Hypertension Mother   ? CVA Mother   ? Aortic stenosis Mother   ? Alcohol abuse Father   ?     deceased  ? Cancer Father   ?     prostate, basal cell   ? Arthritis Father   ? Heart disease Father   ? Lymphoma Brother   ? Heart disease Maternal Grandfather   ? Arthritis Paternal Grandfather   ? Heart disease Paternal Grandfather   ? Colon cancer Neg Hx   ? Colon polyps  Neg Hx   ? Esophageal cancer Neg Hx   ? Stomach cancer Neg Hx   ? Rectal cancer Neg Hx   ? ? ?Social History  ? ?Socioeconomic History  ? Marital status: Married  ?  Spouse name: Not on file  ? Number of children: 3  ? Years of education: Not on file  ? Highest education level: Not on file  ?Occupational History  ? Occupation: Medical Coder  ?  Comment: [employer]  ?Tobacco Use  ? Smoking status: Former  ?  Packs/day: 0.50  ?  Years: 30.00  ?  Pack years: 15.00  ?  Types: Cigarettes  ?  Quit date: 05/17/2015  ?  Years since quitting: 6.1  ? Smokeless tobacco: Never  ?Vaping Use  ? Vaping Use: Never used  ?Substance and Sexual Activity  ? Alcohol use: Yes  ?  Alcohol/week: 14.0 standard drinks  ?  Types: 14 Glasses of wine per week  ?  Comment: 2 glasses of wine a night  ? Drug use: No  ? Sexual activity:  Yes  ?  Partners: Male  ?Other Topics Concern  ? Not on file  ?Social History Narrative  ? 71- daughter  ? 1997-son  ? 33- daughter  ?   ? Youngest at home  ? Married  ? Biking/tennis/swimming, reading  ? 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 ?Social Determinants of Health  ? ?Financial Resource Strain: Not on file  ?Food Insecurity: Not on file  ?Transportation Needs: Not on file  ?Physical Activity: Not on file  ?Stress: Not on file  ?Social Connections: Not on file  ?Intimate Partner Violence: Not on file  ? ? ?Outpatient Medications Prior to Visit  ?Medication Sig Dispense Refill  ? betamethasone valerate ointment (VALISONE) 0.1 % Apply 1 application topically 2 (two) times daily. 30 g 0  ? Calcium Carbonate-Vitamin D 600-400 MG-UNIT chew tablet Chew 1 tablet by mouth 2 (two) times daily.    ? cetirizine (ZYRTEC) 10 MG tablet Take 1 tablet (10 mg total) by mouth daily. 30 tablet 11  ? hydrochlorothiazide (HYDRODIURIL) 25 MG tablet TAKE ONE TABLET BY MOUTH DAILY 90 tablet 1  ? lisinopril (ZESTRIL) 5 MG tablet TAKE ONE TABLET BY MOUTH DAILY 90 tablet 1  ? omeprazole (PRILOSEC) 40 MG capsule TAKE ONE CAPSULE BY MOUTH DAILY 90 capsule 1  ? amoxicillin (AMOXIL) 500 MG capsule Take '2000mg'$  by mouth once prior to dental work (Patient not taking: Reported on 04/20/2021) 4 capsule 5  ? ?No facility-administered medications prior to visit.  ? ? ?Allergies  ?Allergen Reactions  ? Metoprolol Tartrate Palpitations and Other (See Comments)  ?  Chest pain, increased palpitations  ? ? ?Review of Systems  ?Gastrointestinal:  Positive for abdominal pain (LLQ). Negative for blood in stool.  ?Genitourinary:  Positive for frequency and hematuria.  ? ?   ?Objective:  ?  ?Physical Exam ?Vitals and nursing note reviewed.  ?Constitutional:   ?   General: She is not in acute distress. ?   Appearance: Normal appearance. She is not ill-appearing.  ?HENT:  ?   Head: Normocephalic and atraumatic.  ?   Right Ear: External ear normal.  ?    Left Ear: External ear normal.  ?Eyes:  ?   Extraocular Movements: Extraocular movements intact.  ?   Pupils: Pupils are equal, round, and reactive to light.  ?Cardiovascular:  ?   Rate and Rhythm: Normal rate and regular rhythm.  ?  Heart sounds: Normal heart sounds. No murmur heard. ?  No gallop.  ?Pulmonary:  ?   Effort: Pulmonary effort is normal. No respiratory distress.  ?   Breath sounds: Normal breath sounds. No wheezing or rales.  ?Abdominal:  ?   Palpations: Abdomen is soft.  ?   Tenderness: There is no abdominal tenderness. There is no right CVA tenderness, left CVA tenderness, guarding or rebound.  ?Skin: ?   General: Skin is warm and dry.  ?Neurological:  ?   Mental Status: She is alert and oriented to person, place, and time.  ?Psychiatric:     ?   Judgment: Judgment normal.  ? ? ?BP 120/68 (BP Location: Left Arm, Patient Position: Sitting, Cuff Size: Normal)   Pulse 68   Temp 97.6 ?F (36.4 ?C) (Oral)   Resp 18   Ht '5\' 10"'$  (1.778 m)   Wt 184 lb 9.6 oz (83.7 kg)   SpO2 98%   BMI 26.49 kg/m?  ?Wt Readings from Last 3 Encounters:  ?07/10/21 184 lb 9.6 oz (83.7 kg)  ?05/05/21 180 lb (81.6 kg)  ?04/20/21 180 lb (81.6 kg)  ? ? ?   ?Assessment & Plan:  ? ?Problem List Items Addressed This Visit   ? ?  ? Unprioritized  ? Urinary tract infection with hematuria - Primary  ?  Keflex sent in ?Culture sent ?Pt has app with uro gyn next week  ? ?  ?  ? Relevant Medications  ? cephALEXin (KEFLEX) 500 MG capsule  ? ?Other Visit Diagnoses   ? ? Urinary frequency      ? Relevant Orders  ? Urine Culture  ? POCT Urinalysis Dipstick (Automated) (Completed)  ? ?  ? ? ? ? ?Meds ordered this encounter  ?Medications  ? cephALEXin (KEFLEX) 500 MG capsule  ?  Sig: Take 1 capsule (500 mg total) by mouth 2 (two) times daily.  ?  Dispense:  14 capsule  ?  Refill:  0  ? ? ?I, Ann Held, DO, personally preformed the services described in this documentation.  All medical record entries made by the scribe were at my  direction and in my presence.  I have reviewed the chart and discharge instructions (if applicable) and agree that the record reflects my personal performance and is accurate and complete.  07/10/2021 ? ? ?I,Amb

## 2021-07-10 NOTE — Telephone Encounter (Signed)
Patient scheduled to see Dr. Etter Sjogren today ?

## 2021-07-10 NOTE — Assessment & Plan Note (Signed)
Keflex sent in ?Culture sent ?Pt has app with uro gyn next week  ?

## 2021-07-10 NOTE — Patient Instructions (Signed)
Urinary Tract Infection, Adult  A urinary tract infection (UTI) is an infection of any part of the urinary tract. The urinary tract includes the kidneys, ureters, bladder, and urethra. These organs make, store, and get rid of urine in the body. An upper UTI affects the ureters and kidneys. A lower UTI affects the bladder and urethra. What are the causes? Most urinary tract infections are caused by bacteria in your genital area around your urethra, where urine leaves your body. These bacteria grow and cause inflammation of your urinary tract. What increases the risk? You are more likely to develop this condition if: You have a urinary catheter that stays in place. You are not able to control when you urinate or have a bowel movement (incontinence). You are female and you: Use a spermicide or diaphragm for birth control. Have low estrogen levels. Are pregnant. You have certain genes that increase your risk. You are sexually active. You take antibiotic medicines. You have a condition that causes your flow of urine to slow down, such as: An enlarged prostate, if you are female. Blockage in your urethra. A kidney stone. A nerve condition that affects your bladder control (neurogenic bladder). Not getting enough to drink, or not urinating often. You have certain medical conditions, such as: Diabetes. A weak disease-fighting system (immunesystem). Sickle cell disease. Gout. Spinal cord injury. What are the signs or symptoms? Symptoms of this condition include: Needing to urinate right away (urgency). Frequent urination. This may include small amounts of urine each time you urinate. Pain or burning with urination. Blood in the urine. Urine that smells bad or unusual. Trouble urinating. Cloudy urine. Vaginal discharge, if you are female. Pain in the abdomen or the lower back. You may also have: Vomiting or a decreased appetite. Confusion. Irritability or tiredness. A fever or  chills. Diarrhea. The first symptom in older adults may be confusion. In some cases, they may not have any symptoms until the infection has worsened. How is this diagnosed? This condition is diagnosed based on your medical history and a physical exam. You may also have other tests, including: Urine tests. Blood tests. Tests for STIs (sexually transmitted infections). If you have had more than one UTI, a cystoscopy or imaging studies may be done to determine the cause of the infections. How is this treated? Treatment for this condition includes: Antibiotic medicine. Over-the-counter medicines to treat discomfort. Drinking enough water to stay hydrated. If you have frequent infections or have other conditions such as a kidney stone, you may need to see a health care provider who specializes in the urinary tract (urologist). In rare cases, urinary tract infections can cause sepsis. Sepsis is a life-threatening condition that occurs when the body responds to an infection. Sepsis is treated in the hospital with IV antibiotics, fluids, and other medicines. Follow these instructions at home:  Medicines Take over-the-counter and prescription medicines only as told by your health care provider. If you were prescribed an antibiotic medicine, take it as told by your health care provider. Do not stop using the antibiotic even if you start to feel better. General instructions Make sure you: Empty your bladder often and completely. Do not hold urine for long periods of time. Empty your bladder after sex. Wipe from front to back after urinating or having a bowel movement if you are female. Use each tissue only one time when you wipe. Drink enough fluid to keep your urine pale yellow. Keep all follow-up visits. This is important. Contact a health   care provider if: Your symptoms do not get better after 1-2 days. Your symptoms go away and then return. Get help right away if: You have severe pain in  your back or your lower abdomen. You have a fever or chills. You have nausea or vomiting. Summary A urinary tract infection (UTI) is an infection of any part of the urinary tract, which includes the kidneys, ureters, bladder, and urethra. Most urinary tract infections are caused by bacteria in your genital area. Treatment for this condition often includes antibiotic medicines. If you were prescribed an antibiotic medicine, take it as told by your health care provider. Do not stop using the antibiotic even if you start to feel better. Keep all follow-up visits. This is important. This information is not intended to replace advice given to you by your health care provider. Make sure you discuss any questions you have with your health care provider. Document Revised: 09/25/2019 Document Reviewed: 09/25/2019 Elsevier Patient Education  2023 [employer] Inc.  

## 2021-07-12 LAB — URINE CULTURE
MICRO NUMBER:: 13396021
SPECIMEN QUALITY:: ADEQUATE

## 2021-07-19 ENCOUNTER — Ambulatory Visit (INDEPENDENT_AMBULATORY_CARE_PROVIDER_SITE_OTHER): Payer: PRIVATE HEALTH INSURANCE | Admitting: Obstetrics and Gynecology

## 2021-07-19 ENCOUNTER — Encounter: Payer: Self-pay | Admitting: Obstetrics and Gynecology

## 2021-07-19 VITALS — BP 134/81 | HR 62 | Ht 70.0 in | Wt 184.0 lb

## 2021-07-19 DIAGNOSIS — R159 Full incontinence of feces: Secondary | ICD-10-CM | POA: Diagnosis not present

## 2021-07-19 DIAGNOSIS — R350 Frequency of micturition: Secondary | ICD-10-CM

## 2021-07-19 DIAGNOSIS — N952 Postmenopausal atrophic vaginitis: Secondary | ICD-10-CM

## 2021-07-19 DIAGNOSIS — N816 Rectocele: Secondary | ICD-10-CM | POA: Diagnosis not present

## 2021-07-19 LAB — POCT URINALYSIS DIPSTICK
Bilirubin, UA: NEGATIVE
Blood, UA: NEGATIVE
Glucose, UA: NEGATIVE
Ketones, UA: NEGATIVE
Leukocytes, UA: NEGATIVE
Nitrite, UA: NEGATIVE
Protein, UA: NEGATIVE
Spec Grav, UA: 1.01 (ref 1.010–1.025)
Urobilinogen, UA: 0.2 E.U./dL
pH, UA: 7 (ref 5.0–8.0)

## 2021-07-19 MED ORDER — ESTRADIOL 0.1 MG/GM VA CREA: TOPICAL_CREAM | VAGINAL | 11 refills | Status: DC

## 2021-07-19 NOTE — Patient Instructions (Signed)
Start vaginal estrogen therapy nightly for two weeks then 2 times weekly at night for treatment of vaginal atrophy (dryness of the vaginal tissues).  Please let us know if the prescription is too expensive and we can look for alternative options.    Women should try to eat at least 21 to 25 grams of fiber a day, while men should aim for 30 to 38 grams a day. You can add fiber to your diet with food or a fiber supplement such as psyllium (metamucil), benefiber, or fibercon.   Here's a look at how much dietary fiber is found in some common foods. When buying packaged foods, check the Nutrition Facts label for fiber content. It can vary among brands.  Fruits Serving size Total fiber (grams)*  Raspberries 1 cup 8.0  Pear 1 medium 5.5  Apple, with skin 1 medium 4.5  Banana 1 medium 3.0  Orange 1 medium 3.0  Strawberries 1 cup 3.0   Vegetables Serving size Total fiber (grams)*  Green peas, boiled 1 cup 9.0  Broccoli, boiled 1 cup chopped 5.0  Turnip greens, boiled 1 cup 5.0  Brussels sprouts, boiled 1 cup 4.0  Potato, with skin, baked 1 medium 4.0  Sweet corn, boiled 1 cup 3.5  Cauliflower, raw 1 cup chopped 2.0  Carrot, raw 1 medium 1.5   Grains Serving size Total fiber (grams)*  Spaghetti, whole-wheat, cooked 1 cup 6.0  Barley, pearled, cooked 1 cup 6.0  Bran flakes 3/4 cup 5.5  Quinoa, cooked 1 cup 5.0  Oat bran muffin 1 medium 5.0  Oatmeal, instant, cooked 1 cup 5.0  Popcorn, air-popped 3 cups 3.5  Brown rice, cooked 1 cup 3.5  Bread, whole-wheat 1 slice 2.0  Bread, rye 1 slice 2.0   Legumes, nuts and seeds Serving size Total fiber (grams)*  Split peas, boiled 1 cup 16.0  Lentils, boiled 1 cup 15.5  Black beans, boiled 1 cup 15.0  Baked beans, canned 1 cup 10.0  Chia seeds 1 ounce 10.0  Almonds 1 ounce (23 nuts) 3.5  Pistachios 1 ounce (49 nuts) 3.0  Sunflower kernels 1 ounce 3.0  *Rounded to nearest 0.5 gram. Source: [employer] for Standard  Reference, Legacy Release    Accidental Bowel Leakage: Our goal is to achieve formed bowel movements daily or every-other-day without leakage.  You may need to try different combinations of the following options to find what works best for you.  Some management options include: Dietary changes (more leafy greens, vegetables and fruits; less processed foods) Fiber supplementation (Metamucil or something with psyllium as active ingredient)- work up to two tablespoons per day.  Over-the-counter imodium (tablets or liquid) to help solidify the stool and prevent leakage of stool. If you get constipated you can use Miralax as needed to achieve bowel movements.   ACCIDENTAL BOWEL LEAKAGE  Accidental bowel leakage (ABL) is the loss of normal control of the bowels leading to leakage of stool (fecal incontinence) or leakage of stool and gas (anal incontinence).  ABL is a common problem with about 6% (6 out of 100) young women and 15% (15 out of 100) older women experiencing it during their lifetime.  About ABL  The usual cause of ABL is a problem with the muscles and nerves of the rectum and anus.  Normally, the muscles and nerves work together to control bowel movements.  However, women with ABL are not always able to control their bowels such as having loss of control of liquid or  formed stool.  One of the biggest risks for ABL is injury to the anal sphincter during vaginal delivery.  Even if the tear is repaired, damage to the bowel muscles and nerves can progress over time.  As we age, our pelvic muscles weaken and thus bowel control problems can occur as you age.  Other factors that can increase the risk of developing ABL include: - Diabetes that is not well controlled - Other bowel disorders such as irritable bowel syndrome (IBS), hemorrhoids, rectal prolapse or diarrhea - Radiation therapy to the pelvic area - Nervous system disorders such as multiple sclerosis, stroke or spinal cord injury -  Certain medications or nutritional supplements - Severe constipation  Diagnosis Proper bowel control relies on good bowel health and a complex system of nerves and muscles working together.  A careful review of your medical history is needed to find the best treatment for you.  Your healthcare provider will ask questions in order to find out the information needed to best treat you.  They will also examine your rectal/anal area to look for any signs of injury or other findings that may contribute to your symptoms.  Sometimes your healthcare provider will order additional tests.  These can often include: Defecography - this is an x-ray test used to study the rectum and anal canal during a bowel movement Anorectal manometry - this test examines the nerve and muscle function of the rectum and pelvic floor Ultrasound - this exam looks at the muscles and tissue surrounding the rectum to see if there is a tear or injury to the anal muscles  These tests are easy to do and can provide important information to help your provider get a complete picture to help treat your symptoms.  Treatment There are many treatment options available and it may take a trial of a few different options to find what works best for you and your body.  Be honest with your provider and keep working to find a solution.  Some treatment options include: Dietary changes - what you drink and eat affects your bowels.  Drink enough water to make your urine a pale yellow (but not completely clear like water) to ensure you are properly hydrated.  Limit caffeine, soda, alcohol and artificial sweeteners.  For those with liquid stool leakage, you can use a fiber supplement to bulk the stool and imodium to solidify the stool.  Avoid constipation by using fiber or laxatives (miralax) as needed to have normal bowel movements that are soft in consistency.  Timed Bowel Movements - Eating food stimulates the colon to release stool.  Reserve time  for a bowel movement after each meal to ensure you can safely get to a bathroom.    Skin Care - Women with ABL can have irritation or soreness develop in the skin around the anus.  After having a bowel movement, gently wipe with soft toilet paper or you can purchase an attachment for your toilet to be able to rinse the area clean with water.  Do not wipe aggressively as this can stimulate stool seepage from the anus.  Wearing cotton underwear can be helpful as well.  If you need a pad, avoid scented pads and avoid any scented or perfumed soaps, lotions, powders or deodorants as well as any antiseptics, disinfectants or alcohol-containing wipes.   Using a gentle hypoallergenic soap to clean and rinsing clean with water is recommended.    Pelvic floor Muscle Exercises (PFME): PFME can help manage ABL episodes  and enable you to better control your bowels.  For some these can even stop leakage all together.  You can perform these at home or with the assistance of a trained physical therapy specialized in pelvic floor physical therapy.    Medicines:  The drug loperamide can be used to reduce the frequency of diarrhea.  Some women with ABL are not able to completely empty their bowels and these women can benefit from a laxative to help ensure they are completely empty.    Sacral Nerve Stimulation (SNS): SNS can help women gain better control over their bowels.  For this procedure, a device is implanted in the buttock with a electronic lead extending into the spinal canal to deliver pulses of electricity to the nerves that go to your bowels.  This feels like a faint vibration or tapping sensation.  Similar to a pacemaker, this electronic signal changes the nerve messages to the bowel allowing the bowel to be under better control.  Surgery: If damage to your anal muscles is significant, surgery may be recommended.  There are several specific types of surgical procedures but most commonly an anal sphincteroplasty  can be performed to bring the anal muscles back together.

## 2021-07-19 NOTE — Progress Notes (Unsigned)
[facility] New Patient Evaluation and Consultation  Referring Provider: Debbrah Alar, NP PCP: Debbrah Alar, NP Date of Service: 07/19/2021  SUBJECTIVE Chief Complaint: New Patient (Initial Visit) Lauren Walters is a 61 y.o. female here for a consult for fecal leakage, incomplete emptying.)  History of Present Illness: Lauren Walters is a 61 y.o. {ED SANE ZOXW:96045} female seen in consultation at the request of NP Debbrah Alar for evaluation of ***.    Review of records from Debbrah Alar NP significant for: She is having stool leakage. Stool is very soft.   Urinary Symptoms: Does not leak urine.   Day time voids- every few hours.  Nocturia: 1 times per night to void. Voiding dysfunction: she does not empty her bladder well.  does not use a catheter to empty bladder.  When urinating, she feels dribbling after finishing.   UTIs: 2 UTI's in the last year.   Cultures:  07/10/21: >100,000 E. Coli 10/05/20: >100,000 E. Coli Denies history of blood in urine and kidney or bladder stones  Pelvic Organ Prolapse Symptoms:                  She Denies a feeling of a bulge the vaginal area.   Bowel Symptom: Bowel movements: 2-3 time(s) per day Stool consistency: [place] 4-5 in the morning, then later in the day it is hard, [place] 1-2 Straining: yes- later in the day Splinting: no.  Incomplete evacuation: yes.  She Admits to accidental bowel leakage / fecal incontinence  Occurs: a few times per year. Has had larger amounts of stool come out, especially the first time.   Consistency with leakage: soft / liquid Bowel regimen: none Last colonoscopy: Date 04/2021- negative  Sexual Function Sexually active: yes.  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    HTN (hypertension) 03/05/2015   Inclusion cyst 02/17/2018   Left knee pain 09/24/2016   Neck pain 01/30/2015   Osteopenia    Physical exam 12/18/2013   Postmenopausal HRT (hormone replacement therapy) 07/17/2013   Preventative health care 01/30/2015   Skin tag 04/24/2011   Tinnitus of left ear 09/27/2017   Last Assessment & Plan:  Formatting of this note might be different from the original. Concern over ringing at the left ear. 54-monthhistory.  No associated vertigo.  Some associated ear fullness.  No history of ear trauma surgery or infection.  No major history of noise exposure. EXAM shows normal external canals and tympanic membranes. PLAN: We discussed probably there is some inflammation.  We    Varicose veins 04/24/2011     Past Surgical History:   Past Surgical History:  Procedure Laterality Date   [facility]  COLONOSCOPY  03/2015   Dr.Nandigam   JOINT REPLACEMENT     R hip   OVARIAN CYST REMOVAL  1990   ROOT CANAL  TOTAL HIP ARTHROPLASTY  2011   WISDOM TOOTH EXTRACTION       Past OB/GYN History: OB History  Gravida Para Term Preterm AB Living  '4 3 3   1 3  '$ SAB IAB Ectopic Multiple Live Births  1       3    # Outcome Date GA Lbr Len/2nd Weight Sex Delivery Anes PTL Lv  4 Term      CS-Unspec     3 Term      CS-Unspec     2 Term      Vag-Spont     1 SAB             Vaginal deliveries: 1,  Forceps/ Vacuum deliveries: 0, Cesarean section: 2 Menopausal: Denies vaginal bleeding since menopause Contraception: ***. Last pap smear was ***.  Any history of abnormal pap smears:  {yes/no:19897}.   Medications: She has a current medication list which includes the following prescription(s): betamethasone valerate ointment, calcium carbonate-vitamin d, cephalexin, cetirizine, hydrochlorothiazide, lisinopril, and omeprazole.   Allergies: Patient is allergic to metoprolol tartrate.   Social History:  Social History   Tobacco Use   Smoking status: Former    Packs/day: 0.50    Years: 30.00    Pack years: 15.00    Types: Cigarettes    Quit date: 05/17/2015    Years since quitting: 6.1   Smokeless tobacco: Never  Vaping Use   Vaping Use: Never used  Substance Use Topics   Alcohol use: Yes    Alcohol/week: 14.0 standard drinks    Types: 14 Glasses of wine per week    Comment: 2 glasses of wine a night   Drug use: No    Relationship status: {relationship status:24764} She lives with ***.   She {ACTION; IS/IS OHY:07371062} employed ***. Regular exercise: {Yes/No:304960894} History of abuse: {Yes/No:304960894}  Family History:   Family History  Problem Relation Age of Onset   Dementia Mother    Hypertension Mother    CVA Mother    Aortic stenosis Mother    Alcohol abuse Father        deceased   Cancer Father        prostate, basal cell    Arthritis Father    Heart disease Father    Lymphoma Brother    Heart disease Maternal Grandfather    Arthritis Paternal Grandfather    Heart disease Paternal Grandfather    Colon cancer Neg Hx    Colon polyps Neg Hx    Esophageal cancer Neg Hx    Stomach cancer Neg Hx    Rectal cancer Neg Hx      Review of Systems: ROS   OBJECTIVE Physical Exam: Vitals:   07/19/21 1521  BP: 134/81  Pulse: 62  Weight: 184 lb (83.5 kg)  Height: '5\' 10"'$  (1.778 m)    Physical Exam   GU / Detailed Urogynecologic Evaluation:  Pelvic Exam: Normal external female genitalia; Bartholin's and Skene's glands normal in appearance; urethral meatus normal in appearance, no urethral masses or discharge.   CST: {gen  negative/positive:315881}  Reflexes: bulbocavernosis {DESC; PRESENT/NOT PRESENT:21021351}, anocutaneous {DESC; PRESENT/NOT PRESENT:21021351} ***bilaterally.  Speculum exam reveals normal vaginal mucosa {With/Without:20273} atrophy. Cervix {exam; gyn cervix:30847}. Uterus {exam; pelvic uterus:30849}. Adnexa {exam; adnexa:12223}.    s/p hysterectomy: Speculum exam reveals normal vaginal mucosa {With/Without:20273}  atrophy and normal vaginal cuff.  Adnexa {exam; adnexa:12223}.    With apex supported, anterior compartment defect was {reduced:24765}  Pelvic floor strength {Roman # I-V:19040}/V, puborectalis {  Roman # I-V:19040}/V external anal sphincter {Roman # I-V:19040}/V  Pelvic floor musculature: Right levator {Tender/Non-tender:20250}, Right obturator {Tender/Non-tender:20250}, Left levator {Tender/Non-tender:20250}, Left obturator {Tender/Non-tender:20250}  POP-Q:   POP-Q                                               Aa                                               Ba                                                 C                                                Gh                                               Pb                                               tvl                                                Ap                                               Bp                                                 D     Rectal Exam:  Normal sphincter tone, {rectocele:24766} distal rectocele, enterocoele {DESC; PRESENT/NOT PRESENT:21021351}, no rectal masses, {sign of:24767} dyssynergia when asking the patient to bear down.  Post-Void Residual (PVR) by Bladder Scan: In order to evaluate bladder emptying, we discussed obtaining a postvoid residual and she agreed to this procedure.  Procedure: The ultrasound unit was placed on the patient's abdomen in the suprapubic region after the patient had voided. A PVR of 6 ml was obtained by bladder scan.  Laboratory  Results: '@ENCLABS'$ @   ***I visualized the urine specimen, noting the specimen to be {urine color:24768}  ASSESSMENT AND PLAN Ms. Pates is a 61 y.o. with: No diagnosis found.    Jaquita Folds, MD   Medical Decision Making:  - Reviewed/ ordered a clinical laboratory test - Reviewed/ ordered  a radiologic study - Reviewed/ ordered medicine test - Decision to obtain old records - Discussion of management of or test interpretation with an external physician / other healthcare professional  - Assessment requiring independent historian - Review and summation of prior records - Independent review of image, tracing or specimen

## 2021-07-20 ENCOUNTER — Encounter: Payer: Self-pay | Admitting: Obstetrics and Gynecology

## 2021-09-04 NOTE — Therapy (Unsigned)
OUTPATIENT PHYSICAL THERAPY FEMALE PELVIC EVALUATION   Patient Name: Lauren Walters MRN: [id number] DOB:08-31-1960, 61 y.o., female Today's Date: 09/05/2021   PT End of Session - 09/05/21 1648     Visit Number 1    Date for PT Re-Evaluation 11/28/21    Authorization Type [employer]    PT Start Time 1600    PT Stop Time 1648    PT Time Calculation (min) 48 min    Activity Tolerance Patient tolerated treatment well    Behavior During Therapy [facility] for tasks assessed/performed             Past Medical History:  Diagnosis Date   Adhesive capsulitis of right shoulder 09/24/2016   Arthritis    Atypical chest pain 11/11/2015   DEGENERATIVE JOINT DISEASE, RIGHT HIP 11/11/2008   Qualifier: Diagnosis of  By: Oneida Alar MD, KARL     GERD (gastroesophageal reflux disease)    History of chicken pox    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    HTN (hypertension) 03/05/2015   Inclusion cyst 02/17/2018   Left knee pain 09/24/2016   Neck pain 01/30/2015   Osteopenia    Physical exam 12/18/2013   Postmenopausal HRT (hormone replacement therapy) 07/17/2013   Preventative health care 01/30/2015   Skin tag 04/24/2011   Tinnitus of left ear 09/27/2017   Last Assessment & Plan:  Formatting of this note might be different from the original. Concern over ringing at the left ear. 75-monthhistory.  No associated vertigo.  Some associated ear fullness.  No history of ear trauma surgery or infection.  No major history of noise exposure. EXAM shows normal external canals and tympanic membranes. PLAN: We discussed probably there is some inflammation.  We    Varicose veins 04/24/2011   Past Surgical History:  [place]   [place]  03/[place]   Dr.Nandigam   JOINT REPLACEMENT     R hip   OVARIAN CYST REMOVAL  1990   ROOT CANAL     TOTAL HIP ARTHROPLASTY  2011    WISDOM TOOTH EXTRACTION     Patient Active Problem List   Diagnosis Date Noted   Urinary tract infection with hematuria 07/10/2021   Keratosis 03/31/2021   Piriformis syndrome of right side 10/10/2020   Sebaceous cyst 09/28/2020   Aortic calcification ([place] 09/23/2020   Arthritis 09/21/2020   GERD (gastroesophageal reflux disease) 09/21/2020   History of chicken pox 09/21/2020   Inclusion cyst 02/17/2018   Tinnitus of left ear 09/27/2017   Adhesive capsulitis of right shoulder 09/24/2016   Atypical chest pain 11/11/2015   Osteopenia 03/08/2015   HTN (hypertension) 03/05/2015   Preventative health care 01/30/2015   Postmenopausal HRT (hormone replacement therapy) 07/17/2013   Skin tag 04/24/2011   Varicose veins 04/24/2011   HTLVTYPE I CCE & UNS SITE 07/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 Walking.  Fluid intake: Yes: water, coffee     PAIN:  Are you having pain? No  PRECAUTIONS: None  WEIGHT BEARING RESTRICTIONS No  FALLS:  Has patient fallen in last 6 months? Yes. Number of falls 1 random fall when getting up from the chair to turn.   LIVING ENVIRONMENT: Lives with: lives with their spouse  OCCUPATION: coder  PLOF: Independent  PATIENT GOALS not feel like she has to go to the bathroom when she does not need to. Strengthen her pelvic floor  PERTINENT HISTORY:  C-section x 2; right hip replacement; Ovarian cyst  removed Sexual abuse: No  BOWEL MOVEMENT Pain with bowel movement: No Type of bowel movement:Bristol 4-5 in the morning, then later in the day it is hard, Bristol 1-2, no straining;  Fully empty rectum: No Leakage: a few times per year. Has had larger amounts of stool come out, especially the first time. Consistency with leakage: soft / liquid; fiber is making the stool thicker Pads: Yes: wears just in case Fiber supplement: metamucil URINATION Pain with urination: No Fully empty bladder: No, feels dribbling after urination Stream: Strong Urgency: Yes:   Frequency: average Leakage:  none  INTERCOURSE Pain with intercourse: dryness none  PREGNANCY Vaginal deliveries 1 Tearing Yes:   C-section deliveries 2 Currently pregnant No  PROLAPSE Rectocele   Prolapse of posterior vaginal wall    OBJECTIVE:   DIAGNOSTIC FINDINGS:  Pelvic floor strength III/V, puborectalis III/V external anal sphincter IV/V; PVR of 6; estrace cream 0.5g to place nightly for two weeks then twice a week after.     COGNITION:  Overall cognitive status: Within functional limits for tasks assessed      GAIT: Distance walked: 20 feet Assistive device utilized: None Level of assistance: Complete Independence Comments: no issues               POSTURE: No Significant postural limitations   PELVIC ALIGNMENT:  LUMBARAROM/PROM  A/PROM A/PROM  eval  Flexion full  Extension Decreased by 50%  Right lateral flexion Decreased by 25%  Left lateral flexion Decreased by 25%  Right rotation Decreased by 25%  Left rotation Decreased by 25%   (Blank rows = not tested)   LOWER EXTREMITY MMT:  MMT Right eval Left eval  Hip extension 4/5 4/5  Hip abduction 4-/5 4-/5    PALPATION:   General  decreased mobility of c-section scar                External Perineal Exam Good tissue                             Internal Pelvic Floor tightness along the left side of the bladder; Patient was not able to  push the therapist finger out of the anus with breath. Hold anal contraction 3 seconds  Patient confirms identification and approves PT to assess internal pelvic floor and treatment Yes  PELVIC MMT:   MMT eval  Vaginal 4/5  Internal Anal Sphincter 4/5  External Anal Sphincter 4/5  Puborectalis 3/5  (Blank rows = not tested)        TONE: normal  PROLAPSE: Posterior wall weakness  TODAY'S TREATMENT  EVAL Date:  HEP established-see below        PATIENT EDUCATION:  09/05/2021 Education details: Access Code: [email or web address]; educated patient on how to toileting with correct breath Person educated: Patient Education  method: Explanation, Demonstration, Tactile cues, Verbal cues, and Handouts Education comprehension: verbalized understanding, returned demonstration, verbal cues required, tactile cues required, and needs further education   HOME EXERCISE PROGRAM: 09/05/2021 Access Code: [email or web address] URL: [email or web address] Date: 09/05/2021 Prepared by: Earlie Counts  Exercises - Seated Pelvic Floor Contraction  - 3 x daily - 7 x weekly - 1 sets - 6 reps - 3 sec hold  ASSESSMENT:  CLINICAL IMPRESSION: Patient is a 61 y.o. female who was seen today for physical therapy evaluation and treatment for fecal incontinence. Patient reports the fiber has helped her incontinence and she has not had an accident or loose stool. Patient has urgency for urine. Patient c-section scar is restricted. She has difficulty with contracting her lower abdomen. She has weakness in her hips. Vaginal strength is 3/5. Anal sphincter strength is 4/5 and puborectalis is 3/5.  She was not able to push the therapist finger out of the rectum. Patient will benefit from skilled therapy to improve pelvic floor coordination and reduce urinary urgency.    OBJECTIVE IMPAIRMENTS decreased coordination, decreased endurance, and decreased strength.   ACTIVITY LIMITATIONS continence and  toileting  PARTICIPATION LIMITATIONS:  none  PERSONAL FACTORS 1 comorbidity: 2 c-sections  are also affecting patient's functional outcome.   REHAB POTENTIAL: Excellent  CLINICAL DECISION MAKING: Stable/uncomplicated  EVALUATION COMPLEXITY: Low   GOALS: Goals reviewed with patient? Yes  SHORT TERM GOALS: Target date: 10/03/2021  Independent with initial HEP for pelvic floor and core strength Baseline: Goal status: INITIAL   LONG TERM GOALS: Target date: 11/28/2021   Patient is independent with advanced pelvic floor, hip and core strength to improve continence Baseline:  Goal status: INITIAL  2.  Patient reports no fecal leakage for 4 weeks due to improve pelvic floor strength.  Baseline:  Goal status: INITIAL  3.  Patient able to push the therapist finger out of the rectum due to improve toileting technique.  Baseline:  Goal status: INITIAL  4.  Patient reports her urinary urgency has decreased >/= 80%.  Baseline:  Goal status: INITIAL   PLAN: PT FREQUENCY: 1x/week  PT DURATION: 12 weeks  PLANNED INTERVENTIONS: Therapeutic exercises, Therapeutic activity, Neuromuscular re-education, Patient/Family education, Joint mobilization, Biofeedback, and Manual therapy  PLAN FOR NEXT SESSION: go over toileting, scar massage, abdominal contraction   Earlie Counts, PT 09/05/21 4:59 PM

## 2021-09-05 ENCOUNTER — Encounter: Payer: PRIVATE HEALTH INSURANCE | Attending: Obstetrics and Gynecology | Admitting: Physical Therapy

## 2021-09-05 ENCOUNTER — Other Ambulatory Visit: Payer: Self-pay

## 2021-09-05 ENCOUNTER — Encounter: Payer: Self-pay | Admitting: Physical Therapy

## 2021-09-05 DIAGNOSIS — M6281 Muscle weakness (generalized): Secondary | ICD-10-CM | POA: Insufficient documentation

## 2021-09-05 DIAGNOSIS — R3915 Urgency of urination: Secondary | ICD-10-CM | POA: Diagnosis not present

## 2021-09-05 DIAGNOSIS — R159 Full incontinence of feces: Secondary | ICD-10-CM | POA: Insufficient documentation

## 2021-09-14 ENCOUNTER — Encounter: Payer: Self-pay | Admitting: Physical Therapy

## 2021-09-14 ENCOUNTER — Encounter: Payer: PRIVATE HEALTH INSURANCE | Admitting: Physical Therapy

## 2021-09-14 DIAGNOSIS — R3915 Urgency of urination: Secondary | ICD-10-CM

## 2021-09-14 DIAGNOSIS — M6281 Muscle weakness (generalized): Secondary | ICD-10-CM

## 2021-09-14 DIAGNOSIS — R159 Full incontinence of feces: Secondary | ICD-10-CM | POA: Diagnosis not present

## 2021-09-14 NOTE — Patient Instructions (Signed)
About Abdominal Massage  Abdominal massage, also called external colon massage, is a self-treatment circular massage technique that can reduce and eliminate gas and ease constipation. The colon naturally contracts in waves in a clockwise direction starting from inside the right hip, moving up toward the ribs, across the belly, and down inside the left hip.  When you perform circular abdominal massage, you help stimulate your colon's normal wave pattern of movement called peristalsis.  It is most beneficial when done after eating.  Positioning You can practice abdominal massage with oil while lying down, or in the shower with soap.  Some people find that it is just as effective to do the massage through clothing while sitting or standing.  How to Massage Start by placing your finger tips or knuckles on your right side, just inside your hip bone.  Make small circular movements while you move upward toward your rib cage.   Once you reach the bottom right side of your rib cage, take your circular movements across to the left side of the bottom of your rib cage.  Next, move downward until you reach the inside of your left hip bone.  This is the path your feces travel in your colon. Continue to perform your abdominal massage in this pattern for 10 minutes each day.     You can apply as much pressure as is comfortable in your massage.  Start gently and build pressure as you continue to practice.  Notice any areas of pain as you massage; areas of slight pain may be relieved as you massage, but if you have areas of significant or intense pain, consult with your healthcare provider.  Other Considerations General physical activity including bending and stretching can have a beneficial massage-like effect on the colon.  Deep breathing can also stimulate the colon because breathing deeply activates the same nervous system that supplies the colon.   Abdominal massage should always be used in combination with a  bowel-conscious diet that is high in the proper type of fiber for you, fluids (primarily water), and a regular exercise program.  Taygen Newsome, PT [facility] [address], [address] [place], [place] [address] W: [phone] [email or web address]  

## 2021-09-14 NOTE — Therapy (Signed)
OUTPATIENT PHYSICAL THERAPY TREATMENT NOTE   Patient Name: Lauren Walters MRN: [id number] DOB:[birth year], 61 y.o., female Today's Date: 09/14/2021  PCP: Debbrah Alar, NP REFERRING PROVIDER: Jaquita Folds, MD  END OF SESSION:   PT End of Session - 09/14/21 1304     Visit Number 2    Date for PT Re-Evaluation 11/28/21    Authorization Type [employer]    PT Start Time 1300    PT Stop Time 1345    PT Time Calculation (min) 45 min    Activity Tolerance Patient tolerated treatment well    Behavior During Therapy [facility] for tasks assessed/performed             Past Medical History:  Diagnosis Date   Adhesive capsulitis of right shoulder 09/24/2016   Arthritis    Atypical chest pain 11/11/2015   DEGENERATIVE JOINT DISEASE, RIGHT HIP 11/11/2008   Qualifier: Diagnosis of  By: Oneida Alar MD, KARL     GERD (gastroesophageal reflux disease)    History of chicken pox    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    HTN (hypertension) 03/05/2015   Inclusion cyst 02/17/2018   Left knee pain 09/24/2016   Neck pain 01/30/2015   Osteopenia    Physical exam 12/18/2013   Postmenopausal HRT (hormone replacement therapy) 07/17/2013   Preventative health care 01/30/2015   Skin tag 04/24/2011   Tinnitus of left ear 09/27/2017   Last Assessment & Plan:  Formatting of this note might be different from the original. Concern over ringing at the left ear. 39-monthhistory.  No associated vertigo.  Some associated ear fullness.  No history of ear trauma surgery or infection.  No major history of noise exposure. EXAM shows normal external canals and tympanic membranes. PLAN: We discussed probably there is some inflammation.  We    Varicose veins 04/24/2011   Past Surgical History:  [place]   [place]  [place]   Dr.Nandigam   JOINT REPLACEMENT      R hip   OVARIAN CYST REMOVAL  1990   ROOT CANAL     TOTAL HIP ARTHROPLASTY  2011   WISDOM TOOTH EXTRACTION     Patient Active Problem List   Diagnosis Date Noted   Urinary tract infection with hematuria 07/10/2021   Keratosis 03/31/2021   Piriformis syndrome of right side 10/10/2020   Sebaceous cyst 09/28/2020   Aortic calcification ([place] 09/23/2020   Arthritis 09/21/2020   GERD (gastroesophageal reflux disease) 09/21/2020   History of chicken pox 09/21/2020   Inclusion cyst 02/17/2018   Tinnitus of left ear 09/27/2017   Adhesive capsulitis of right shoulder 09/24/2016   Atypical chest pain 11/11/2015   Osteopenia 03/08/2015   HTN (hypertension) 03/05/2015   Preventative health care 01/30/2015   Postmenopausal HRT (hormone replacement therapy) 07/17/2013   Skin tag 04/24/2011   Varicose veins 04/24/2011   HTLVTYPE I CCE & UNS SITE 07/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 When I up it and will take a week to get where I need.   Fluid intake: Yes: water, coffee       PAIN:  Are you having pain? No   PRECAUTIONS: None   WEIGHT BEARING RESTRICTIONS No   FALLS:  Has patient fallen in last 6 months? Yes. Number of falls 1 random fall when getting up from the chair to turn.    LIVING ENVIRONMENT: Lives with: lives with their spouse   OCCUPATION: coder   PLOF: Independent   PATIENT GOALS not feel like she has to go to the bathroom when she does not need  to. Strengthen her pelvic floor   PERTINENT HISTORY:  C-section x 2; right hip replacement; Ovarian cyst removed Sexual abuse: No   BOWEL MOVEMENT Pain with bowel movement: No Type of bowel movement:Bristol 4-5 in the morning, then later in the day it is hard, [place] 1-2, no straining;  Fully empty rectum: No Leakage: a few times per year. Has had larger amounts of stool come out, especially the first time. Consistency with leakage: soft / liquid; fiber is making the stool thicker Pads: Yes: wears just in case Fiber supplement: metamucil URINATION Pain with urination: No Fully empty bladder: No, feels dribbling after urination Stream: Strong Urgency: Yes:   Frequency: average Leakage:  none   INTERCOURSE Pain with intercourse: dryness none   PREGNANCY Vaginal deliveries 1 Tearing Yes:   C-section deliveries 2 Currently pregnant No   PROLAPSE Rectocele   Prolapse of posterior vaginal wall      OBJECTIVE:    DIAGNOSTIC FINDINGS:  Pelvic floor strength III/V, puborectalis III/V external anal sphincter IV/V; PVR of 6; estrace cream 0.5g to place nightly for two weeks then twice a week after.        COGNITION:            Overall cognitive status: Within functional limits for tasks assessed                            GAIT: Distance walked: 20 feet Assistive device utilized: None Level of assistance: Complete Independence Comments: no issues                POSTURE: No Significant postural limitations               PELVIC ALIGNMENT:   LUMBARAROM/PROM   A/PROM A/PROM  eval  Flexion full  Extension Decreased by 50%  Right lateral flexion Decreased by 25%  Left lateral flexion Decreased by 25%  Right rotation Decreased by 25%  Left rotation Decreased by 25%   (Blank rows = not tested)     LOWER EXTREMITY MMT:   MMT Right eval Left eval  Hip extension 4/5 4/5  Hip abduction 4-/5 4-/5     PALPATION:   General  decreased mobility of c-section scar                  External Perineal Exam Good tissue                             Internal Pelvic Floor tightness along the left side of the bladder; Patient was not able to push the therapist finger out of the anus with breath. Hold anal contraction 3 seconds   Patient confirms identification and approves PT to assess  internal pelvic floor and treatment Yes   PELVIC MMT:   MMT eval  Vaginal 4/5  Internal Anal Sphincter 4/5  External Anal Sphincter 4/5  Puborectalis 3/5  (Blank rows = not tested)         TONE: normal   PROLAPSE: Posterior wall weakness   TODAY'S TREATMENT  09/14/2021 Manual: Soft tissue mobilization:circular massage around the abdomen to improve peristalic motion of the intestines, manual work to the diaphragm to improve mobilty Scar tissue mobilization:scar massage to the lower abdomen  and educated patient on how to perform at home.  Myofascial release:fascial release around the umbilicus, along the lower abdomen going through the restrictions Neuromuscular re-education: Core retraining: transverse abdominus contraction with anal contraction 10x  Therapeutic activities: Functional strengthening activities:Educated patient on correct toileting technique with diaphragmatic breathing, breathing out to relax the pelvic floor, knees higher than the hips.  Self-care: Educated patient on abdominal massage to promote peristalic motion of the intestines       PATIENT EDUCATION:  09/14/2021 Education details: Access Code: [email or web address]; educated patient on how to toileting with correct breath, abdominal massage, scar massage Person educated: Patient Education method: Explanation, Demonstration, Tactile cues, Verbal cues, and Handouts Education comprehension: verbalized understanding, returned demonstration, verbal cues required, tactile cues required, and needs further education     HOME EXERCISE PROGRAM: 09/14/2021  Access Code: [email or web address] URL:  [email or web address] Date: 09/14/2021 Prepared by: Earlie Counts  Exercises - Seated Pelvic Floor Contraction  - 3 x daily - 7 x weekly - 1 sets - 6 reps - 3 sec hold - Supine Transversus Abdominis Bracing - Hands on Stomach  - 1 x daily - 7 x weekly - 1 sets - 10 reps ASSESSMENT:   CLINICAL IMPRESSION: Patient is a 61 y.o. female who was seen today for physical therapy  treatment for fecal incontinence. Patient had good bowel sounds with the abdominal work. She had improved mobility of the scar. Patient still has some restrictions of the diaphragm and around the umbilicus.  No fecal leakage since last visit.  Patient will benefit from skilled therapy to improve pelvic floor coordination and reduce urinary urgency.      OBJECTIVE IMPAIRMENTS decreased coordination, decreased endurance, and decreased strength.    ACTIVITY LIMITATIONS continence and toileting   PARTICIPATION LIMITATIONS:  none   PERSONAL FACTORS 1 comorbidity: 2 c-sections  are also affecting patient's functional outcome.    REHAB POTENTIAL: Excellent   CLINICAL DECISION MAKING: Stable/uncomplicated   EVALUATION COMPLEXITY: Low     GOALS: Goals reviewed with patient? Yes   SHORT TERM GOALS: Target date: 10/03/2021   Independent with initial HEP for pelvic floor and core strength Baseline: Goal status: INITIAL     LONG TERM GOALS: Target date: 11/28/2021    Patient is independent with advanced pelvic floor, hip and core strength to improve continence Baseline:  Goal status: INITIAL   2.  Patient reports no fecal leakage for 4 weeks due to improve pelvic floor strength.  Baseline:  Goal status: INITIAL   3.  Patient able to push the therapist finger out of the rectum due to improve toileting technique.  Baseline:  Goal status: INITIAL   4.  Patient reports her urinary urgency has decreased >/= 80%.  Baseline:  Goal status: INITIAL     PLAN: PT FREQUENCY: 1x/week   PT DURATION: 12  weeks   PLANNED INTERVENTIONS: Therapeutic exercises, Therapeutic activity, Neuromuscular re-education, Patient/Family education, Joint mobilization, Biofeedback, and Manual therapy   PLAN FOR NEXT  SESSION: abdominal work by the diaphragm and umbilicus, abdominal strength, see how toileting is going, hip stretches  Earlie Counts, PT 09/14/21 1:54 PM

## 2021-09-28 ENCOUNTER — Encounter: Payer: Self-pay | Admitting: Physical Therapy

## 2021-09-28 ENCOUNTER — Encounter: Payer: PRIVATE HEALTH INSURANCE | Attending: Obstetrics and Gynecology | Admitting: Physical Therapy

## 2021-09-28 DIAGNOSIS — M6281 Muscle weakness (generalized): Secondary | ICD-10-CM | POA: Insufficient documentation

## 2021-09-28 DIAGNOSIS — R3915 Urgency of urination: Secondary | ICD-10-CM | POA: Insufficient documentation

## 2021-09-28 NOTE — Therapy (Signed)
OUTPATIENT PHYSICAL THERAPY TREATMENT NOTE   Patient Name: Lauren Walters MRN: [id number] DOB:04-07-1960, 61 y.o., female Today's Date: 09/28/2021  PCP: Debbrah Alar, NP REFERRING PROVIDER: Jaquita Folds, MD  END OF SESSION:   PT End of Session - 09/28/21 1405     Visit Number 3    Date for PT Re-Evaluation 11/28/21    Authorization Type [employer]    PT Start Time 1400    PT Stop Time 6940    PT Time Calculation (min) 45 min    Activity Tolerance Patient tolerated treatment well    Behavior During Therapy [facility] for tasks assessed/performed             Past Medical History:  Diagnosis Date   Adhesive capsulitis of right shoulder 09/24/2016   Arthritis    Atypical chest pain 11/11/2015   DEGENERATIVE JOINT DISEASE, RIGHT HIP 11/11/2008   Qualifier: Diagnosis of  By: Oneida Alar MD, KARL     GERD (gastroesophageal reflux disease)    History of chicken pox    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    HTN (hypertension) 03/05/2015   Inclusion cyst 02/17/2018   Left knee pain 09/24/2016   Neck pain 01/30/2015   Osteopenia    Physical exam 12/18/2013   Postmenopausal HRT (hormone replacement therapy) 07/17/2013   Preventative health care 01/30/2015   Skin tag 04/24/2011   Tinnitus of left ear 09/27/2017   Last Assessment & Plan:  Formatting of this note might be different from the original. Concern over ringing at the left ear. 14-monthhistory.  No associated vertigo.  Some associated ear fullness.  No history of ear trauma surgery or infection.  No major history of noise exposure. EXAM shows normal external canals and tympanic membranes. PLAN: We discussed probably there is some inflammation.  We    Varicose veins 04/24/2011   Past Surgical History:  Procedure Laterality Date   CBray  COLONOSCOPY  03/2015   Dr.Nandigam   JOINT REPLACEMENT     R  hip   OVARIAN CYST REMOVAL  1990   ROOT CANAL     TOTAL HIP ARTHROPLASTY  2011   WISDOM TOOTH EXTRACTION     Patient Active Problem List   Diagnosis Date Noted   Urinary tract infection with hematuria 07/10/2021   Keratosis 03/31/2021   Piriformis syndrome of right side 10/10/2020   Sebaceous cyst 09/28/2020   Aortic calcification ([place] 09/23/2020   Arthritis 09/21/2020   GERD (gastroesophageal reflux disease) 09/21/2020   History of chicken pox 09/21/2020   Inclusion cyst 02/17/2018   Tinnitus of left ear 09/27/2017   Adhesive capsulitis of right shoulder 09/24/2016   Atypical chest pain 11/11/2015   Osteopenia 03/08/2015   HTN (hypertension) 03/05/2015   Preventative health care 01/30/2015   Postmenopausal HRT (hormone replacement therapy) 07/17/2013   Skin tag 04/24/2011   Varicose veins 04/24/2011   HTLVTYPE I CCE & UNS SITE 07/08/2007   REFERRING PROVIDER: SJaquita Folds MD   REFERRING DIAG: R15.9 (ICD-10-CM) - Incontinence of feces, unspecified fecal incontinence type   THERAPY DIAG:  Muscle weakness (generalized)   Urinary urgency   Rationale for Evaluation and Treatment Rehabilitation   ONSET DATE: 2020   SUBJECTIVE:  SUBJECTIVE STATEMENT: I have figured out my fiber. I have had not problems.,      PAIN:  Are you having pain? No   PRECAUTIONS: None   WEIGHT BEARING RESTRICTIONS No   FALLS:  Has patient fallen in last 6 months? Yes. Number of falls 1 random fall when getting up from the chair to turn.    LIVING ENVIRONMENT: Lives with: lives with their spouse   OCCUPATION: coder   PLOF: Independent   PATIENT GOALS not feel like she has to go to the bathroom when she does not need to. Strengthen her pelvic floor   PERTINENT HISTORY:  C-section x 2; right  hip replacement; Ovarian cyst removed Sexual abuse: No   BOWEL MOVEMENT Pain with bowel movement: No Type of bowel movement:[place] 4-5 in the morning, then later in the day it is hard, [place] 1-2, no straining;  Fully empty rectum: No Leakage: a few times per year. Has had larger amounts of stool come out, especially the first time. Consistency with leakage: soft / liquid; fiber is making the stool thicker Pads: Yes: wears just in case Fiber supplement: metamucil URINATION Pain with urination: No Fully empty bladder: No, feels dribbling after urination Stream: Strong Urgency: Yes:   Frequency: average Leakage:  none   INTERCOURSE Pain with intercourse: dryness none   PREGNANCY Vaginal deliveries 1 Tearing Yes:   C-section deliveries 2 Currently pregnant No   PROLAPSE Rectocele   Prolapse of posterior vaginal wall      OBJECTIVE:    DIAGNOSTIC FINDINGS:  Pelvic floor strength III/V, puborectalis III/V external anal sphincter IV/V; PVR of 6; estrace cream 0.5g to place nightly for two weeks then twice a week after.        COGNITION:            Overall cognitive status: Within functional limits for tasks assessed                            GAIT: Distance walked: 20 feet Assistive device utilized: None Level of assistance: Complete Independence Comments: no issues                POSTURE: No Significant postural limitations               PELVIC ALIGNMENT:   LUMBARAROM/PROM   A/PROM A/PROM  eval  Flexion full  Extension Decreased by 50%  Right lateral flexion Decreased by 25%  Left lateral flexion Decreased by 25%  Right rotation Decreased by 25%  Left rotation Decreased by 25%   (Blank rows = not tested)     LOWER EXTREMITY MMT:   MMT Right eval Left eval Right 09/27/2021 Left  09/27/2021  Hip extension 4/5 4/5 4/5 4/5  Hip abduction 4-/5 4-/5 4-/5 4-/5     PALPATION:   General  decreased mobility of c-section scar                 External  Perineal Exam Good tissue                             Internal Pelvic Floor tightness along the left side of the bladder; Patient was not able to push the therapist finger out of the anus with breath. Hold anal contraction 3 seconds   Patient confirms identification and approves PT to assess internal pelvic floor and treatment Yes   PELVIC MMT:  MMT eval  Vaginal 4/5  Internal Anal Sphincter 4/5  External Anal Sphincter 4/5  Puborectalis 3/5  (Blank rows = not tested)         TONE: normal   PROLAPSE: Posterior wall weakness   TODAY'S TREATMENT  09/28/2021 Manual: Soft tissue mobilization: to the diaphragm and upper abdominals to improve tissue mobility.  Myofascial release: to abdomen on the mesenteric root, along the upper abdomen and along the lower abdomen to release the restrictions around the intestines Neuromuscular re-education: Core retraining:supine hip flexion isometric holding 5 sec 5x each leg    Anti rotational movement with red theraband in standing to engage the obliques 15x each direction    Pallof in standing 15x each side   09/14/2021 Manual: Soft tissue mobilization:circular massage around the abdomen to improve peristalic motion of the intestines, manual work to the diaphragm to improve mobilty Scar tissue mobilization:scar massage to the lower abdomen  and educated patient on how to perform at home.  Myofascial release:fascial release around the umbilicus, along the lower abdomen going through the restrictions Neuromuscular re-education: Core retraining: transverse abdominus contraction with anal contraction 10x   Therapeutic activities: Functional strengthening activities:Educated patient on correct toileting technique with diaphragmatic breathing, breathing out to relax the pelvic floor, knees higher than the hips.  Self-care: Educated patient on abdominal massage to promote peristalic motion of the intestines       PATIENT EDUCATION:   09/28/2021 Education details: Access Code: [email or web address] Person educated: Patient Education method: Explanation, Demonstration,and Handouts Education comprehension: verbalized understanding, returned demonstration    HOME EXERCISE PROGRAM: 09/28/2021 Access Code: [email or web address] URL: [email or web address] Date: 09/28/2021 Prepared by: Earlie Counts  Exercises - Seated Pelvic Floor Contraction  - 3 x daily - 7 x weekly - 1 sets - 6 reps - 3 sec hold - Supine Transversus Abdominis Bracing - Hands on Stomach  - 1 x daily - 7 x weekly - 1 sets - 10 reps - Hooklying Isometric Hip Flexion  - 1 x daily - 3 x weekly - 3 sets - 10 reps - Standing Trunk Rotation with Resistance  - 1 x daily - 3 x weekly - 3 sets - 10 reps - Standing Anti-Rotation Press with Anchored Resistance  - 1 x daily - 3 x weekly - 3 sets - 10 reps ASSESSMENT:   CLINICAL IMPRESSION: Patient is a 62 y.o. female who was seen today for physical therapy  treatment for fecal incontinence. she is not having issues with bowel movements. She is not leaking stool. Patient is having movements regularly and taking Fiber routinely.  Patient is independent with her HEP. She is happy with her progress. The urinary urgency does not bother her.   OBJECTIVE IMPAIRMENTS decreased coordination, decreased endurance, and decreased strength.    ACTIVITY LIMITATIONS continence and toileting   PARTICIPATION LIMITATIONS:  none   PERSONAL FACTORS 1 comorbidity: 2 c-sections  are also affecting patient's functional outcome.    REHAB POTENTIAL: Excellent   CLINICAL DECISION MAKING: Stable/uncomplicated   EVALUATION COMPLEXITY: Low     GOALS: Goals reviewed with patient? Yes   SHORT TERM GOALS: Target date: 10/03/2021   Independent with initial HEP for pelvic floor and core strength Baseline: Goal status: met 09/28/2021    LONG TERM GOALS: Target date: 11/28/2021    Patient is independent with advanced pelvic floor, hip and core strength  to improve continence Baseline:  Met 09/28/2021  2.  Patient reports no fecal leakage for 4 weeks due to improve pelvic  floor strength.  Baseline: No fecal leakage. Always clean.  Goal status: Goal status: Met 09/28/2021   3.  Patient able to push the therapist finger out of the rectum due to improve toileting technique.  Baseline:  Goal status: not assessed but patient is not having difficulty with bowel movements.    4.  Patient reports her urinary urgency has decreased >/= 80%.  Baseline: urinate then a little while later has to urinate again  Goal status: not met 09/28/2021     PLAN: PT FREQUENCY: 1x/week   PT DURATION: 12 weeks   PLANNED INTERVENTIONS: Therapeutic exercises, Therapeutic activity, Neuromuscular re-education, Patient/Family education, Joint mobilization, Biofeedback, and Manual therapy   PLAN FOR NEXT SESSION: Discharge to HEP Earlie Counts, PT 09/28/21 2:59 PM    PHYSICAL THERAPY DISCHARGE SUMMARY  Visits from Start of Care: 3  Current functional level related to goals / functional outcomes: See above.    Remaining deficits: See above.    Education / Equipment: HEP   Patient agrees to discharge. Patient goals were partially met. Patient is being discharged due to being pleased with the current functional level. Thank you for the referral. Earlie Counts, PT 09/28/21 3:00 PM

## 2021-10-02 ENCOUNTER — Encounter: Payer: Self-pay | Admitting: Family

## 2021-10-02 ENCOUNTER — Ambulatory Visit (INDEPENDENT_AMBULATORY_CARE_PROVIDER_SITE_OTHER): Payer: PRIVATE HEALTH INSURANCE | Admitting: Family

## 2021-10-02 VITALS — BP 129/59 | HR 69 | Temp 97.9°F | Resp 16 | Ht 70.0 in | Wt 181.0 lb

## 2021-10-02 DIAGNOSIS — Z0000 Encounter for general adult medical examination without abnormal findings: Secondary | ICD-10-CM | POA: Diagnosis not present

## 2021-10-02 DIAGNOSIS — I10 Essential (primary) hypertension: Secondary | ICD-10-CM

## 2021-10-02 DIAGNOSIS — R911 Solitary pulmonary nodule: Secondary | ICD-10-CM | POA: Diagnosis not present

## 2021-10-02 DIAGNOSIS — L729 Follicular cyst of the skin and subcutaneous tissue, unspecified: Secondary | ICD-10-CM | POA: Diagnosis not present

## 2021-10-02 DIAGNOSIS — Z87891 Personal history of nicotine dependence: Secondary | ICD-10-CM

## 2021-10-02 MED ORDER — LISINOPRIL 5 MG PO TABS: 5.0000 mg | ORAL_TABLET | Freq: Every day | ORAL | 1 refills | Status: DC

## 2021-10-02 NOTE — Assessment & Plan Note (Addendum)
Wt Readings from Last 3 Encounters:  10/02/21 181 lb (82.1 kg)  07/19/21 184 lb (83.5 kg)  07/10/21 184 lb 9.6 oz (83.7 kg)   Continue to working on healthy diet and regular exercise. Pap/mammo and colo up to date.

## 2021-10-02 NOTE — Patient Instructions (Signed)
Please complete lab work prior.

## 2021-10-02 NOTE — Progress Notes (Signed)
Subjective:   By signing my name below, I, Lauren Walters, attest that this documentation has been prepared under the direction and in the presence of Lauren Alar, NP 10/02/2021     Patient ID: Lauren Walters, female    DOB: Nov 19, 1960, 61 y.o.   MRN: [id number]  Chief Complaint  Patient presents with   Annual Exam         HPI Patient is in today for a comprehensive physical exam.  Itching- She complains of itching behind her left shoulder around her cyst. She reports having a prior procedure to have it drained at her dermatologists office. She also notes there is surrounding redness around her cyst. She is interested in seeing her dermatologist to manage her symptoms.  Lung cancer screening- She is requesting for lung cancer screening. She has a history of smoking and quit in 2017. She has no received a screening since she quit smoking or within the past 15 years.  Allergies- She continues taking OTC zyrtec daily to manage her allergies.  Reflux- She is managing her reflux well on her own at this time. She is no longer taking omeprazole to manage her symptoms and instead takes OTC Tums every couple of weeks.  Blood pressure- Her blood pressure is doing well during this visit. She continues taking 5 mg lisinopril, 25 mg hydrochlorothiazide and reports no new issues while taking them.  BP Readings from Last 3 Encounters:  10/02/21 (!) 129/59  07/19/21 134/81  07/10/21 120/68   Pulse Readings from Last 3 Encounters:  10/02/21 69  07/19/21 62  07/10/21 68   Social history: She has no changes to her family medical history. She has no new surgeries to report.  She denies unexpected weight gain, adenopathy, Fever, New moles, congestion, sinus pain, sore throat, chest pain, palpitaitons, leg swelling, shortness of breath, wheezing, cough, Nausea, vomiting, diarrhea, consitpation, blood in stool, dysuria, frequency, hematuria, new joint pain, new muscle pain, frequent headaches,  dizziness, depression, or anxiety.  Immunizations: She is UTD on tetanus vaccine. She is UTD on shingles vaccines. She was recommended to receive the new Covid-19 vaccine after it releases this fall season.  Diet: She is maintaining a healthy diet at this time.  Exercise: She is participating in regular exercise. She has lost 3 lb's since May, 2023.  Colonoscopy: Last completed 05/05/2021. Results show: - One 30 mm polyp in the cecum. Resection not attempted. Tattooed. Biopsied. - Moderate diverticulosis in the sigmoid colon, in the descending colon, in the transverse colon and in the ascending colon. - Non-bleeding external and internal hemorrhoids. Dexa: Last completed 09/01/2018. Results show she is considered OSTEOPENIC according to [facility]. Repeat in 2 years.  Pap Smear: Last completed 09/18/2019. Result are normal.  Mammogram: Last completed 01/30/2021. Results are normal. Repeat in 1 year.    Health Maintenance Due  Topic Date Due   COVID-19 Vaccine (4 - Booster for Pfizer series) 02/04/2021   INFLUENZA VACCINE  09/26/2021    Past Medical History:  Diagnosis Date   Adhesive capsulitis of right shoulder 09/24/2016   Arthritis    Atypical chest pain 11/11/2015   DEGENERATIVE JOINT DISEASE, RIGHT HIP 11/11/2008   Qualifier: Diagnosis of  By: Oneida Alar MD, KARL     GERD (gastroesophageal reflux disease)    History of chicken pox    HTLVTYPE I CCE & UNS SITE 07/08/2007   Qualifier: Diagnosis of  By: Larose Kells MD, [place].    HTN (hypertension) 03/05/2015   Inclusion  cyst 02/17/2018   Left knee pain 09/24/2016   Neck pain 01/30/2015   Osteopenia    Physical exam 12/18/2013   Postmenopausal HRT (hormone replacement therapy) 07/17/2013   Preventative health care 01/30/2015   Skin tag 04/24/2011   Tinnitus of left ear 09/27/2017   Last Assessment & Plan:  Formatting of this note might be different from the original. Concern over ringing at the left ear. 64-monthhistory.  No  associated vertigo.  Some associated ear fullness.  No history of ear trauma surgery or infection.  No major history of noise exposure. EXAM shows normal external canals and tympanic membranes. PLAN: We discussed probably there is some inflammation.  We    Varicose veins 04/24/2011    Past Surgical History:  Procedure Laterality Date   [place]  COLONOSCOPY  03/2015   Dr.Nandigam   JOINT REPLACEMENT     R hip   OVARIAN CYST REMOVAL  1990   ROOT CANAL     TOTAL HIP ARTHROPLASTY  2011   WISDOM TOOTH EXTRACTION      Family History  Problem Relation Age of Onset   Dementia Mother    Hypertension Mother    CVA Mother    Aortic stenosis Mother    Alcohol abuse Father        deceased   Cancer Father        prostate, basal cell    Arthritis Father    Heart disease Father    Lymphoma Brother    Heart disease Maternal Grandfather    Arthritis Paternal Grandfather    Heart disease Paternal Grandfather    Colon cancer Neg Hx    Colon polyps Neg Hx    Esophageal cancer Neg Hx    Stomach cancer Neg Hx    Rectal cancer Neg Hx     Social History   Socioeconomic History   Marital status: Married    Spouse name: Not on file   Number of children: 3   Years of education: Not on file   Highest education level: Not on file  Occupational History   Occupation: Medical Coder    Comment: [employer]  Tobacco Use   Smoking status: Former    Packs/day: 0.50    Years: 30.00    Total pack years: 15.00    Types: Cigarettes    Quit date: 05/17/2015    Years since quitting: 6.3   Smokeless tobacco: Never  Vaping Use   Vaping Use: Never used  Substance and Sexual Activity   Alcohol use: Yes    Alcohol/week: 14.0 standard drinks of alcohol    Types: 14 Glasses of wine per week    Comment: 2 glasses of wine a night   Drug use: No   Sexual activity: Yes    Partners: Male    Birth control/protection: None  Other  Topics Concern   Not on file  Social History Narrative   118 daughter   1997-son   1998- daughter      Youngest at home   Married   Biking/tennis/swimming, reading   [employer](neurosurgery)    Social Determinants of Health   Financial Resource Strain: Not on file  Food Insecurity: Not on file  Transportation Needs: Not on file  Physical Activity: Not on file  Stress: Not on file  Social Connections: Not on file  Intimate Partner Violence: Not on file  Outpatient Medications Prior to Visit  Medication Sig Dispense Refill   betamethasone valerate ointment (VALISONE) 0.1 % Apply 1 application topically 2 (two) times daily. 30 g 0   Calcium Carbonate-Vitamin D 600-400 MG-UNIT chew tablet Chew 1 tablet by mouth 2 (two) times daily.     cephALEXin (KEFLEX) 500 MG capsule Take 1 capsule (500 mg total) by mouth 2 (two) times daily. 14 capsule 0   cetirizine (ZYRTEC) 10 MG tablet Take 1 tablet (10 mg total) by mouth daily. 30 tablet 11   estradiol (ESTRACE) 0.1 MG/GM vaginal cream Place 0.5g nightly for two weeks then twice a week after 30 g 11   hydrochlorothiazide (HYDRODIURIL) 25 MG tablet TAKE ONE TABLET BY MOUTH DAILY 90 tablet 1   lisinopril (ZESTRIL) 5 MG tablet TAKE ONE TABLET BY MOUTH DAILY 90 tablet 1   omeprazole (PRILOSEC) 40 MG capsule TAKE ONE CAPSULE BY MOUTH DAILY 90 capsule 1   No facility-administered medications prior to visit.    Allergies  Allergen Reactions   Metoprolol Tartrate Palpitations and Other (See Comments)    Chest pain, increased palpitations    Review of Systems  Constitutional:  Negative for fever.       (-)unexpected weight change (-)Adenopathy  HENT:  Negative for congestion, hearing loss, sinus pain and sore throat.        (-)Rhinorrhea   Eyes:        (-)Visual disturbance  Respiratory:  Negative for cough, shortness of breath and wheezing.   Cardiovascular:  Negative for chest pain, palpitations and leg swelling.   Gastrointestinal:  Negative for blood in stool, constipation, diarrhea, nausea and vomiting.  Genitourinary:  Negative for dysuria, frequency and hematuria.  Musculoskeletal:        (-)new muscle pain (-)new joint pain  Skin:  Positive for itching (right shoulder).       (-)new moles (+)redness surrounding cyst on right shoulder.   Neurological:  Negative for dizziness and headaches.  Psychiatric/Behavioral:  Negative for depression. The patient is not nervous/anxious.        Objective:    Physical Exam Constitutional:      General: She is not in acute distress.    Appearance: Normal appearance. She is not ill-appearing.  HENT:     Head: Normocephalic and atraumatic.     Right Ear: Tympanic membrane, ear canal and external ear normal.     Left Ear: Tympanic membrane, ear canal and external ear normal.  Eyes:     Extraocular Movements: Extraocular movements intact.     Right eye: No nystagmus.     Left eye: No nystagmus.     Pupils: Pupils are equal, round, and reactive to light.  Neck:     Thyroid: No thyroid tenderness.  Cardiovascular:     Rate and Rhythm: Normal rate and regular rhythm.     Heart sounds: Normal heart sounds. No murmur heard.    No gallop.  Pulmonary:     Effort: Pulmonary effort is normal. No respiratory distress.     Breath sounds: Normal breath sounds. No wheezing or rales.  Abdominal:     General: There is no distension.     Palpations: Abdomen is soft.     Tenderness: There is no abdominal tenderness. There is no guarding.  Musculoskeletal:     Comments: 5/5 strength in both upper and lower extremities  Lymphadenopathy:     Cervical: No cervical adenopathy.  Skin:    General: Skin is warm and dry.  Comments: Mobile pea sized cyst on right shoulder; non-tender.   Neurological:     Mental Status: She is alert and oriented to person, place, and time.     Deep Tendon Reflexes:     Reflex Scores:      Patellar reflexes are 2+ on the right  side and 2+ on the left side. Psychiatric:        Judgment: Judgment normal.     BP (!) 129/59 (BP Location: Right Arm, Patient Position: Sitting, Cuff Size: Small)   Pulse 69   Temp 97.9 F (36.6 C) (Oral)   Resp 16   Ht '5\' 10"'$  (1.778 m)   Wt 181 lb (82.1 kg)   SpO2 100%   BMI 25.97 kg/m  Wt Readings from Last 3 Encounters:  10/02/21 181 lb (82.1 kg)  07/19/21 184 lb (83.5 kg)  07/10/21 184 lb 9.6 oz (83.7 kg)       Assessment & Plan:   Problem List Items Addressed This Visit       Unprioritized   Preventative health care - Primary    Wt Readings from Last 3 Encounters:  10/02/21 181 lb (82.1 kg)  07/19/21 184 lb (83.5 kg)  07/10/21 184 lb 9.6 oz (83.7 kg)  Continue to working on healthy diet and regular exercise. Pap/mammo and colo up to date.       HTN (hypertension)    BP Readings from Last 3 Encounters:  10/02/21 (!) 129/59  07/19/21 134/81  07/10/21 120/68  At goal. Continue HCTZ and lisinopril.        Relevant Medications   lisinopril (ZESTRIL) 5 MG tablet   Other Relevant Orders   Basic metabolic panel   Other Visit Diagnoses     Skin cyst       Relevant Orders   Ambulatory referral to Dermatology   Lung nodule       History of tobacco abuse       Relevant Orders   CT CHEST LUNG CA SCREEN LOW DOSE W/O CM        Meds ordered this encounter  Medications   lisinopril (ZESTRIL) 5 MG tablet    Sig: Take 1 tablet (5 mg total) by mouth daily.    Dispense:  90 tablet    Refill:  1    Order Specific Question:   Supervising Provider    Answer:   Penni Homans A [[id number]]    I, Lauren Pear, NP, personally preformed the services described in this documentation.  All medical record entries made by the scribe were at my direction and in my presence.  I have reviewed the chart and discharge instructions (if applicable) and agree that the record reflects my personal performance and is accurate and complete. 10/02/2021   I,Lauren Walters,acting  as a scribe for Lauren Pear, NP.,have documented all relevant documentation on the behalf of Lauren Pear, NP,as directed by  Lauren Pear, NP while in the presence of Lauren Pear, NP.   Lauren Pear, NP

## 2021-10-02 NOTE — Assessment & Plan Note (Signed)
BP Readings from Last 3 Encounters:  10/02/21 (!) 129/59  07/19/21 134/81  07/10/21 120/68   At goal. Continue HCTZ and lisinopril.

## 2021-10-03 LAB — BASIC METABOLIC PANEL
BUN: 9 mg/dL (ref 6–23)
CO2: 28 mEq/L (ref 19–32)
Calcium: 10.3 mg/dL (ref 8.4–10.5)
Chloride: 102 mEq/L (ref 96–112)
Creatinine, Ser: 0.69 mg/dL (ref 0.40–1.20)
GFR: 92.57 mL/min (ref >60.00)
Glucose, Bld: 90 mg/dL (ref 70–99)
Potassium: 4.6 mEq/L (ref 3.5–5.1)
Sodium: 137 mEq/L (ref 135–145)

## 2021-10-06 ENCOUNTER — Telehealth: Payer: Self-pay | Admitting: Family

## 2021-10-06 NOTE — Telephone Encounter (Signed)
See mychart.  

## 2021-10-10 ENCOUNTER — Encounter: Payer: Self-pay | Admitting: Family

## 2021-10-10 NOTE — Telephone Encounter (Signed)
Letter faxed to [facility]. Cobb RN at fax [phone]

## 2021-10-10 NOTE — Telephone Encounter (Signed)
Lauren Walters,   I wrote a letter updating her pack year history.  Is there somewhere we can mail the appeal? I am on vacation but you can forward to Lauren Walters.   Thanks,  [employer]

## 2021-10-12 ENCOUNTER — Encounter: Payer: PRIVATE HEALTH INSURANCE | Admitting: Physical Therapy

## 2021-10-14 ENCOUNTER — Other Ambulatory Visit: Payer: Self-pay | Admitting: Family

## 2021-11-13 ENCOUNTER — Ambulatory Visit (HOSPITAL_BASED_OUTPATIENT_CLINIC_OR_DEPARTMENT_OTHER)
Admission: RE | Admit: 2021-11-13 | Discharge: 2021-11-13 | Disposition: A | Discharge status: Home / Self Care | Payer: PRIVATE HEALTH INSURANCE | Source: Ambulatory Visit | Attending: Family | Admitting: Family

## 2021-11-13 DIAGNOSIS — Z87891 Personal history of nicotine dependence: Secondary | ICD-10-CM | POA: Diagnosis present

## 2021-11-26 ENCOUNTER — Encounter: Payer: Self-pay | Admitting: Family

## 2021-11-30 ENCOUNTER — Encounter: Payer: Self-pay | Admitting: Family Medicine

## 2021-11-30 ENCOUNTER — Encounter: Payer: Self-pay | Admitting: Family

## 2021-11-30 ENCOUNTER — Telehealth (INDEPENDENT_AMBULATORY_CARE_PROVIDER_SITE_OTHER): Payer: PRIVATE HEALTH INSURANCE | Admitting: Family Medicine

## 2021-11-30 ENCOUNTER — Encounter: Payer: Self-pay | Admitting: Obstetrics and Gynecology

## 2021-11-30 VITALS — Ht 70.0 in

## 2021-11-30 DIAGNOSIS — U071 COVID-19: Secondary | ICD-10-CM

## 2021-11-30 MED ORDER — NIRMATRELVIR/RITONAVIR (PAXLOVID)TABLET: 3.0000 | ORAL_TABLET | Freq: Two times a day (BID) | ORAL | 0 refills | Status: AC

## 2021-11-30 NOTE — Telephone Encounter (Signed)
Patient scheduled for virtual today with Dr. Frederico Hamman Copland

## 2021-11-30 NOTE — Progress Notes (Signed)
Lauren Walters T. Ripley Bogosian, MD, Lauren Walters at [facility] [place], [address]  Phone: [phone]  FAX: [phone]  Lauren Walters - 61 y.o. female  MRN [id number]  Date of Birth: Jan 03, 1961  Date: 11/30/2021  PCP: Debbrah Alar, NP  Referral: Debbrah Alar, NP  Chief Complaint  Patient presents with   Covid Positive    Symptoms started on Tuesday Positive home test this morning   Cough    Dry   Sore Throat   Nasal Congestion   Fatigue        Ear Fullness   Virtual Visit via Video Note:  I connected with  Lauren Walters on 11/30/2021 11:00 AM EDT by a video enabled telemedicine application and verified that I am speaking with the correct person using two identifiers.   Location patient: home computer, tablet, or smartphone Location provider: work or home office Consent: Verbal consent directly obtained from [employer]. Persons participating in the virtual visit: patient, provider  I discussed the limitations of evaluation and management by telemedicine and the availability of in person appointments. The patient expressed understanding and agreed to proceed.  Chief Complaint  Patient presents with   Covid Positive    Symptoms started on Tuesday Positive home test this morning   Cough    Dry   Sore Throat   Nasal Congestion   Fatigue        Ear Fullness    History of Present Illness:  Cough, throat is scratchy.  Ear hurts, test is positive.    She started to feel sick on Tuesday and got a scratchy throat, and since then she is got a lot of nasal congestion and runny nose.  She also has a sore throat, some generalized fatigue with some mild body aches.  She also has a cough that is nonproductive, and she has some fullness in her ears.  She denies any fever.  But she does not have a thermometer.  She has no GI symptoms including no nausea, vomiting, diarrhea.  She does not have  any neurological symptoms including no changes in taste or smell.  She does understand how to quarantine, she is currently out of town.  Review of Systems as above: See pertinent positives and pertinent negatives per HPI No acute distress verbally   Observations/Objective/Exam:  An attempt was made to discern vital signs over the phone and per patient if applicable and possible.   General:    Alert, Oriented, appears well and in no acute distress  Pulmonary:     On inspection no signs of respiratory distress.  Psych / Neurological:     Pleasant and cooperative.  Assessment and Plan:    ICD-10-CM   1. COVID-19  U07.1 nirmatrelvir/ritonavir EUA (PAXLOVID) 20 x 150 MG & 10 x '100MG'$  TABS     Confirmed COVID-61 in 61 year old patient.  We will put the patient on Paxlovid, she can otherwise continue other supportive care such as Tylenol, ibuprofen and cough medication if needed.  I discussed the assessment and treatment plan with the patient. The patient was provided an opportunity to ask questions and all were answered. The patient agreed with the plan and demonstrated an understanding of the instructions.   The patient was advised to call back or seek an in-person evaluation if the symptoms worsen or if the condition fails to improve as anticipated.  Follow-up: prn unless noted otherwise below No follow-ups on file.  Meds ordered this encounter  Medications   nirmatrelvir/ritonavir EUA (PAXLOVID) 20 x 150 MG & 10 x '100MG'$  TABS    Sig: Take 3 tablets by mouth 2 (two) times daily for 5 days. (Take nirmatrelvir 150 mg two tablets twice daily for 5 days and ritonavir 100 mg one tablet twice daily for 5 days) Patient GFR is 93    Dispense:  30 tablet    Refill:  0   No orders of the defined types were placed in this encounter.   Signed,  Maud Deed. Timoteo Carreiro, MD

## 2021-12-03 ENCOUNTER — Encounter (INDEPENDENT_AMBULATORY_CARE_PROVIDER_SITE_OTHER): Payer: PRIVATE HEALTH INSURANCE | Admitting: Family

## 2021-12-03 DIAGNOSIS — L0291 Cutaneous abscess, unspecified: Secondary | ICD-10-CM

## 2021-12-04 MED ORDER — DOXYCYCLINE HYCLATE 100 MG PO TABS: 100.0000 mg | ORAL_TABLET | Freq: Two times a day (BID) | ORAL | 0 refills | Status: DC

## 2021-12-04 NOTE — Telephone Encounter (Signed)
Pt was called and notified to wear a mask to her appt

## 2021-12-04 NOTE — Telephone Encounter (Signed)
Please see the MyChart message reply(ies) for my assessment and plan.  The patient gave consent for this Medical Advice Message and is aware that it may result in a bill to their insurance company as well as the possibility that this may result in a co-payment or deductible. They are an established patient, but are not seeking medical advice exclusively about a problem treated during an in person or video visit in the last 7 days. I did not recommend an in person or video visit within 7 days of my reply.  I spent a total of 5 minutes cumulative provider time within 7 days through MyChart messaging.  Quinteria Chisum S O'Sullivan, NP  

## 2021-12-06 ENCOUNTER — Ambulatory Visit (INDEPENDENT_AMBULATORY_CARE_PROVIDER_SITE_OTHER): Payer: PRIVATE HEALTH INSURANCE | Admitting: Obstetrics and Gynecology

## 2021-12-06 ENCOUNTER — Encounter: Payer: Self-pay | Admitting: Obstetrics and Gynecology

## 2021-12-06 VITALS — BP 121/74 | HR 54

## 2021-12-06 DIAGNOSIS — N952 Postmenopausal atrophic vaginitis: Secondary | ICD-10-CM

## 2021-12-06 DIAGNOSIS — R159 Full incontinence of feces: Secondary | ICD-10-CM

## 2021-12-06 NOTE — Patient Instructions (Signed)
Vulvovaginal moisturizer Options: Vitamin E oil (pump or capsule) or cream (Gene's Vit E Cream) Coconut oil Silicone-based lubricant for use during intercourse ("uberlube" or "wet platinum" is a brand available at most drugstores) Consider the ingredients of the product - the fewer the ingredients the better!  Directions for Use: Clean and dry your hands Gently dab the vulvar/vaginal area dry as needed Apply a "pea-sized" amount of the moisturizer onto your fingertip Using you other hand, open the labia  Apply the moisturizer to the vulvar/vaginal tissues Wear loose fitting underwear/clothing if possible following application Use moisturize up to 3 times daily as desired.

## 2021-12-06 NOTE — Progress Notes (Signed)
[facility] Return Visit  SUBJECTIVE  History of Present Illness: TEQUIA WOLMAN is a 61 y.o. female seen in follow-up for bowel leakage and vaginal atrophy. Plan at last visit was to start psyllium and pelvic PT. She was also prescribed vaginal estrogen cream.   Had 3 appointments with pelvic PT. She is doing a tablespoon of psyllium fiber supplement and added more fiber to her diet. Has not had any bowel leakage at all.   Has been using the estrogen two to three days a week. It has helped with dryness overall but still has some.   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   Past Surgical History: She  has a past surgical history that includes Joint replacement; Cholecystectomy (1991); Ovarian cyst removal (1990); Cesarean section (1995); Cesarean section (1997); Total hip arthroplasty (2011); Wisdom tooth extraction; Root canal; and Colonoscopy (03/2015).   Medications: She has a current medication list which includes the following prescription(s): betamethasone valerate ointment, calcium carbonate-vitamin d, cetirizine, doxycycline, estradiol, hydrochlorothiazide, lisinopril, and amoxicillin.   Allergies: Patient is allergic to metoprolol tartrate.   Social History: Patient  reports that she quit smoking about 6 years ago. Her smoking use included cigarettes. She  has a 22.50 pack-year smoking history. She has never used smokeless tobacco. She reports current alcohol use of about 14.0 standard drinks of alcohol per week. She reports that she does not use drugs.      OBJECTIVE     Physical Exam: Vitals:   12/06/21 1305  BP: 121/74  Pulse: (!) 54   Gen: No apparent distress, A&O x 3.  Detailed Urogynecologic Evaluation:  Deferred.    ASSESSMENT AND PLAN    Ms. Fuchs is a 61 y.o. with:  1. Incontinence of feces, unspecified fecal incontinence type   2. Vaginal atrophy    - Bowel leakage has resolved with fiber and PT.  - Continue estrogen cream 2-3 times per week.  - Can also use natural vaginal moisturizer such as coconut oil or vitamin E. Also recommended silicone vaginal lubricant for intercourse.   Follow up as needed  Jaquita Folds, MD  Time spent: I spent 18 minutes dedicated to the care of this patient on the date of this encounter to include pre-visit review of records, face-to-face time with the patient and post visit documentation.

## 2021-12-09 ENCOUNTER — Encounter: Payer: Self-pay | Admitting: Family

## 2021-12-28 ENCOUNTER — Other Ambulatory Visit: Payer: Self-pay | Admitting: Family

## 2022-02-13 ENCOUNTER — Encounter: Payer: Self-pay | Admitting: Family

## 2022-02-26 HISTORY — PX: TOTAL HIP ARTHROPLASTY: SHX124

## 2022-04-04 ENCOUNTER — Ambulatory Visit: Payer: PRIVATE HEALTH INSURANCE | Admitting: Family

## 2022-04-04 VITALS — BP 132/64 | HR 70 | Resp 18 | Ht 70.0 in | Wt 186.4 lb

## 2022-04-04 DIAGNOSIS — M169 Osteoarthritis of hip, unspecified: Secondary | ICD-10-CM | POA: Insufficient documentation

## 2022-04-04 DIAGNOSIS — K219 Gastro-esophageal reflux disease without esophagitis: Secondary | ICD-10-CM | POA: Diagnosis not present

## 2022-04-04 DIAGNOSIS — N952 Postmenopausal atrophic vaginitis: Secondary | ICD-10-CM | POA: Diagnosis not present

## 2022-04-04 DIAGNOSIS — I10 Essential (primary) hypertension: Secondary | ICD-10-CM

## 2022-04-04 LAB — BASIC METABOLIC PANEL
BUN: 12 mg/dL (ref 6–23)
CO2: 29 mEq/L (ref 19–32)
Calcium: 9.9 mg/dL (ref 8.4–10.5)
Chloride: 101 mEq/L (ref 96–112)
Creatinine, Ser: 0.65 mg/dL (ref 0.40–1.20)
GFR: 93.58 mL/min (ref >60.00)
Glucose, Bld: 87 mg/dL (ref 70–99)
Potassium: 4.7 mEq/L (ref 3.5–5.1)
Sodium: 142 mEq/L (ref 135–145)

## 2022-04-04 MED ORDER — LISINOPRIL 5 MG PO TABS: 5.0000 mg | ORAL_TABLET | Freq: Every day | ORAL | 1 refills | Status: DC

## 2022-04-04 MED ORDER — ESTRADIOL 0.1 MG/GM VA CREA: TOPICAL_CREAM | VAGINAL | 11 refills | Status: DC

## 2022-04-04 NOTE — Progress Notes (Signed)
Subjective:     Patient ID: Lauren Walters, female    DOB: 03/06/1960, 62 y.o.   MRN: [id number]  Chief Complaint  Patient presents with   Follow-up    6 month    HPI Patient is in today for follow up.  GERD- maintained on omeprazole.Reports that she only takes on holidays/travel prn etc.    HTN- maintained on lisinopril, hctz.  BP Readings from Last 3 Encounters:  04/04/22 132/64  12/06/21 121/74  10/02/21 (!) 129/59   She is seeing Ortho for Hip pain and they gave her meloxicam which she feels is very helpful.    Health Maintenance Due  Topic Date Due   COVID-19 Vaccine (5 - 2023-24 season) 04/08/2022    Past Medical History:  Diagnosis Date   Adhesive capsulitis of right shoulder 09/24/2016   Arthritis    Atypical chest pain 11/11/2015   DEGENERATIVE JOINT DISEASE, RIGHT HIP 11/11/2008   Qualifier: Diagnosis of  By: Oneida Alar MD, KARL     GERD (gastroesophageal reflux disease)    History of chicken pox    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    HTN (hypertension) 03/05/2015   Inclusion cyst 02/17/2018   Left knee pain 09/24/2016   Neck pain 01/30/2015   Osteopenia    Physical exam 12/18/2013   Postmenopausal HRT (hormone replacement therapy) 07/17/2013   Preventative health care 01/30/2015   Skin tag 04/24/2011   Tinnitus of left ear 09/27/2017   Last Assessment & Plan:  Formatting of this note might be different from the original. Concern over ringing at the left ear. 86-monthhistory.  No associated vertigo.  Some associated ear fullness.  No history of ear trauma surgery or infection.  No major history of noise exposure. EXAM shows normal external canals and tympanic membranes. PLAN: We discussed probably there is some inflammation.  We    Varicose veins 04/24/2011    Past Surgical History:  Procedure Laterality Date   [place]  COLONOSCOPY   03/2015   Dr.Nandigam   JOINT REPLACEMENT     R hip   OVARIAN CYST REMOVAL  1990   ROOT CANAL     TOTAL HIP ARTHROPLASTY  2011   WISDOM TOOTH EXTRACTION      Family History  Problem Relation Age of Onset   Dementia Mother    Hypertension Mother    CVA Mother    Aortic stenosis Mother    Alcohol abuse Father        deceased   Cancer Father        prostate, basal cell    Arthritis Father    Heart disease Father    Lymphoma Brother    Heart disease Maternal Grandfather    Arthritis Paternal Grandfather    Heart disease Paternal Grandfather    Colon cancer Neg Hx    Colon polyps Neg Hx    Esophageal cancer Neg Hx    Stomach cancer Neg Hx    Rectal cancer Neg Hx     Social History   Socioeconomic History   Marital status: Married    Spouse name: Not on file   Number of children: 3   Years of education: Not on file   Highest education level: Not on file  Occupational History   Occupation: Medical Coder    Comment: [employer]  Tobacco Use   Smoking status: Former    Packs/day: 0.75    Years: 30.00    Total pack years: 22.50    Types: Cigarettes    Quit date: 05/17/2015    Years since quitting: 6.8   Smokeless tobacco: Never  Vaping Use   Vaping Use: Never used  Substance and Sexual Activity   Alcohol use: Yes    Alcohol/week: 14.0 standard drinks of alcohol    Types: 14 Glasses of wine per week    Comment: 2 glasses of wine a night   Drug use: No   Sexual activity: Yes    Partners: Male    Birth control/protection: None  Other Topics Concern   Not on file  Social History Narrative   47- daughter   1997-son   1998- daughter      Youngest at home   Married   Biking/tennis/swimming, reading   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     betamethasone valerate ointment (VALISONE) 0.1 % Apply 1 application topically 2 (two) times daily. 30 g 0   Calcium Carbonate-Vitamin D 600-400 MG-UNIT chew tablet Chew 1 tablet by mouth 2 (two) times daily.     cetirizine (ZYRTEC) 10 MG tablet Take 1 tablet (10 mg total) by mouth daily. 30 tablet 11   hydrochlorothiazide (HYDRODIURIL) 25 MG tablet TAKE ONE TABLET BY MOUTH DAILY 90 tablet 1   amoxicillin (AMOXIL) 500 MG capsule TAKE 4 CAPSULES BY MOUTH ONCE PRIOR TO DENTAL WORK (Patient not taking: Reported on 12/06/2021) 4 capsule 5   doxycycline (VIBRA-TABS) 100 MG tablet Take 1 tablet (100 mg total) by mouth 2 (two) times daily. 14 tablet 0   estradiol (ESTRACE) 0.1 MG/GM vaginal cream Place 0.5g nightly for two weeks then twice a week after 30 g 11   lisinopril (ZESTRIL) 5 MG tablet Take 1 tablet (5 mg total) by mouth daily. 90 tablet 1   No facility-administered medications prior to visit.    Allergies  Allergen Reactions   Metoprolol Tartrate Palpitations and Other (See Comments)    Chest pain, increased palpitations    ROS See HPI    Objective:    Physical Exam Constitutional:      General: She is not in acute distress.    Appearance: Normal appearance. She is well-developed.  HENT:     Head: Normocephalic and atraumatic.     Right Ear: External ear normal.     Left Ear: External ear normal.  Eyes:     General: No scleral icterus. Neck:     Thyroid: No thyromegaly.  Cardiovascular:     Rate and Rhythm: Normal rate and regular rhythm.     Heart sounds: Normal heart sounds. No murmur heard. Pulmonary:     Effort: Pulmonary effort is normal. No respiratory distress.     Breath sounds: Normal breath sounds. No wheezing.   Musculoskeletal:     Cervical back: Neck supple.  Skin:    General: Skin  is warm and dry.  Neurological:     Mental Status: She is alert and oriented to person, place, and time.  Psychiatric:        Mood and Affect: Mood normal.        Behavior: Behavior normal.        Thought Content: Thought content normal.        Judgment: Judgment normal.     BP 132/64   Pulse 70   Resp 18   Ht '5\' 10"'$  (1.778 m)   Wt 186 lb 6.4 oz (84.6 kg)   SpO2 100%   BMI 26.75 kg/m  Wt Readings from Last 3 Encounters:  04/04/22 186 lb 6.4 oz (84.6 kg)  10/02/21 181 lb (82.1 kg)  07/19/21 184 lb (83.5 kg)       Assessment & Plan:   Problem List Items Addressed This Visit       Unprioritized   Osteoarthritis of hip    Followed by orthopedics.  Pain is improved with meloxicam.       Relevant Medications   meloxicam (MOBIC) 15 MG tablet   HTN (hypertension) - Primary    BP at goal. Continue lisinopril and hctz.        Relevant Medications   lisinopril (ZESTRIL) 5 MG tablet   Other Relevant Orders   Basic Metabolic Panel (BMET)   GERD (gastroesophageal reflux disease)    Stable with rare use of omeprazole.       Relevant Medications   omeprazole (PRILOSEC) 40 MG capsule   Atrophic vaginitis    On estrace cream, previously prescribed by Urogyn. Pt states that they have signed off and are requesting refills.  Refills have been sent.       Other Visit Diagnoses     Vaginal atrophy       Relevant Medications   estradiol (ESTRACE) 0.1 MG/GM vaginal cream       I have discontinued Matrice A. Gillooly's amoxicillin and doxycycline. I am also having her maintain her cetirizine, Calcium Carbonate-Vitamin D, betamethasone valerate ointment, hydrochlorothiazide, meloxicam, omeprazole, estradiol, and lisinopril.  Meds ordered this encounter  Medications   estradiol (ESTRACE) 0.1 MG/GM vaginal cream    Sig: Place 0.5g nightly for two weeks then twice a week after    Dispense:  30 g     Refill:  11    Order Specific Question:   Supervising Provider    Answer:   Penni Homans A [[id number]]   lisinopril (ZESTRIL) 5 MG tablet    Sig: Take 1 tablet (5 mg total) by mouth daily.    Dispense:  90 tablet    Refill:  1    Order Specific Question:   Supervising Provider    Answer:   Penni Homans A [[id number]]

## 2022-04-04 NOTE — Assessment & Plan Note (Signed)
On estrace cream, previously prescribed by Urogyn. Pt states that they have signed off and are requesting refills.  Refills have been sent.

## 2022-04-04 NOTE — Assessment & Plan Note (Signed)
BP at goal. Continue lisinopril and hctz.

## 2022-04-04 NOTE — Assessment & Plan Note (Signed)
Followed by orthopedics.  Pain is improved with meloxicam.

## 2022-04-04 NOTE — Assessment & Plan Note (Signed)
Stable with rare use of omeprazole.

## 2022-06-11 ENCOUNTER — Encounter: Payer: Self-pay | Admitting: *Deleted

## 2022-06-12 ENCOUNTER — Ambulatory Visit: Payer: No Typology Code available for payment source | Admitting: Family

## 2022-06-12 VITALS — BP 148/63 | HR 72 | Temp 98.0°F | Resp 16 | Wt 192.0 lb

## 2022-06-12 DIAGNOSIS — R1032 Left lower quadrant pain: Secondary | ICD-10-CM | POA: Diagnosis not present

## 2022-06-12 MED ORDER — CIPROFLOXACIN HCL 500 MG PO TABS: 500.0000 mg | ORAL_TABLET | Freq: Two times a day (BID) | ORAL | 0 refills | Status: DC

## 2022-06-12 MED ORDER — METRONIDAZOLE 500 MG PO TABS: 500.0000 mg | ORAL_TABLET | Freq: Two times a day (BID) | ORAL | 0 refills | Status: AC

## 2022-06-12 NOTE — Progress Notes (Signed)
Subjective:   By signing my name below, I, Lauren Walters, attest that this documentation has been prepared under the direction and in the presence of Sandford Craze, NP.  06/12/2022.   Patient ID: Lauren Walters, female    DOB: Aug 14, 1960, 62 y.o.   MRN: [id number]  Chief Complaint  Patient presents with   Abdominal Pain    Patient complains of LLQ pain for about 2 weeks    HPI Patient is in today for an office visit.  Abdominal pain:  She complains of persistent LLQ pain for about 2 weeks. She describes this as feeling achy, similar to menstrual pains. Aside from somewhat more frequent bowel movements, her bowels have been normal. No hematochezia. She does state that she is not eating as well lately and not formally exercising. Her colonoscopy 04/2021 was notable for diverticula.   Surgery:  She has a left total hip arthroplasty scheduled for 07/12/22.  Family history:  Recently her brother wore a heart monitor for dizzy/fainting spells. He subsequently underwent PPM implant.   Past Medical History:  Diagnosis Date   Adhesive capsulitis of right shoulder 09/24/2016   Arthritis    Atypical chest pain 11/11/2015   DEGENERATIVE JOINT DISEASE, RIGHT HIP 11/11/2008   Qualifier: Diagnosis of  By: Darrick Penna MD, KARL     GERD (gastroesophageal reflux disease)    History of chicken pox    HTLVTYPE I CCE & UNS SITE 07/08/2007   Qualifier: Diagnosis of  By: Drue Novel MD, Jose E.    HTN (hypertension) 03/05/2015   Inclusion cyst 02/17/2018   Left knee pain 09/24/2016   Neck pain 01/30/2015   Osteopenia    Physical exam 12/18/2013   Postmenopausal HRT (hormone replacement therapy) 07/17/2013   Preventative health care 01/30/2015   Skin tag 04/24/2011   Tinnitus of left ear 09/27/2017   Last Assessment & Plan:  Formatting of this note might be different from the original. Concern over ringing at the left ear. 21-month history.  No associated vertigo.  Some associated ear fullness.  No  history of ear trauma surgery or infection.  No major history of noise exposure. EXAM shows normal external canals and tympanic membranes. PLAN: We discussed probably there is some inflammation.  We    Varicose veins 04/24/2011    Past Surgical History:  Procedure Laterality Date   CESAREAN SECTION  1995   CESAREAN SECTION  1997   CHOLECYSTECTOMY  1991   COLONOSCOPY  03/2015   Dr.Nandigam   JOINT REPLACEMENT     R hip   OVARIAN CYST REMOVAL  1990   ROOT CANAL     TOTAL HIP ARTHROPLASTY  2011   WISDOM TOOTH EXTRACTION      Family History  Problem Relation Age of Onset   Dementia Mother    Hypertension Mother    CVA Mother    Aortic stenosis Mother    Alcohol abuse Father        deceased   Cancer Father        prostate, basal cell    Arthritis Father    Heart disease Father    Lymphoma Brother    Heart disease Maternal Grandfather    Arthritis Paternal Grandfather    Heart disease Paternal Grandfather    Colon cancer Neg Hx    Colon polyps Neg Hx    Esophageal cancer Neg Hx    Stomach cancer Neg Hx    Rectal cancer Neg Hx     Social History  Socioeconomic History   Marital status: Married    Spouse name: Not on file   Number of children: 3   Years of education: Not on file   Highest education level: Not on file  Occupational History   Occupation: Medical Coder    Comment: [employer]  Tobacco Use   Smoking status: Former    Packs/day: 0.75    Years: 30.00    Additional pack years: 0.00    Total pack years: 22.50    Types: Cigarettes    Quit date: 05/17/2015    Years since quitting: 7.0   Smokeless tobacco: Never  Vaping Use   Vaping Use: Never used  Substance and Sexual Activity   Alcohol use: Yes    Alcohol/week: 14.0 standard drinks of alcohol    Types: 14 Glasses of wine per week    Comment: 2 glasses of wine a night   Drug use: No   Sexual activity: Yes    Partners: Male    Birth control/protection: None  Other Topics Concern   Not on  file  Social History Narrative   56- daughter   77-son   54- daughter      Youngest at home   Married   Biking/tennis/swimming, reading   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 30 g 0   Calcium Carbonate-Vitamin D 600-400 MG-UNIT chew tablet Chew 1 tablet by mouth 2 (two) times daily.     cetirizine (ZYRTEC) 10 MG tablet Take 1 tablet (10 mg total) by mouth daily. 30 tablet 11   estradiol (ESTRACE) 0.1 MG/GM vaginal cream Place 0.5g nightly for two weeks then twice a week after 30 g 11   hydrochlorothiazide (HYDRODIURIL) 25 MG tablet TAKE ONE TABLET BY MOUTH DAILY 90 tablet 1   lisinopril (ZESTRIL) 5 MG tablet Take 1 tablet (5 mg total) by mouth daily. 90 tablet 1   meloxicam (MOBIC) 15 MG tablet      omeprazole (PRILOSEC) 40 MG capsule Take 40 mg by mouth daily as needed.     No facility-administered medications prior to visit.    Allergies  Allergen Reactions   Metoprolol Tartrate Palpitations and Other (See Comments)    Chest pain, increased palpitations    Review of Systems  Gastrointestinal:  Positive for abdominal pain (LLQ).  See HPI.     Objective:    Physical Exam Constitutional:      Appearance: Normal appearance.  HENT:     Head: Normocephalic and atraumatic.     Right Ear: Tympanic membrane, ear canal and external ear normal.     Left Ear: Tympanic membrane, ear canal and external ear normal.  Eyes:     Extraocular Movements: Extraocular movements intact.     Pupils: Pupils are equal, round, and reactive to light.   Cardiovascular:     Rate and Rhythm: Normal rate and regular rhythm.     Heart sounds: Normal heart sounds. No murmur heard.    No gallop.  Pulmonary:     Effort: Pulmonary effort is normal. No  respiratory distress.     Breath sounds: Normal breath sounds. No wheezing or rales.  Abdominal:     General: Bowel sounds are normal.     Palpations: Abdomen is soft.     Tenderness: There is abdominal tenderness in the left lower quadrant. There is no guarding.     Comments: Mild LLQ tenderness.  Skin:    General: Skin is warm and dry.  Neurological:     General: No focal deficit present.     Mental Status: She is alert and oriented to person, place, and time.  Psychiatric:        Mood and Affect: Mood normal.        Behavior: Behavior normal.     BP (!) 148/63 (BP Location: Right Arm, Patient Position: Sitting, Cuff Size: Small)   Pulse 72   Temp 98 F (36.7 C) (Oral)   Resp 16   Wt 192 lb (87.1 kg)   SpO2 98%   BMI 27.55 kg/m  Wt Readings from Last 3 Encounters:  06/12/22 192 lb (87.1 kg)  04/04/22 186 lb 6.4 oz (84.6 kg)  10/02/21 181 lb (82.1 kg)      Assessment & Plan:   Problem List Items Addressed This Visit       Unprioritized   Left lower quadrant abdominal pain - Primary    New. She has hx of diverticulosis on colonoscopy.  Hx concerning for diverticulitis.  Will begin empiric cipro/flagyl and order a CT abd/pelvis for further evaluation. She is advised not to drink alcohol while taking metronidazole. Also advised to go to the ER if she develops increased abdominal pain or fever.       Relevant Orders   CT Abdomen Pelvis W Contrast  Lab work up to date in care everywhere.    Meds ordered this encounter  Medications   metroNIDAZOLE (FLAGYL) 500 MG tablet    Sig: Take 1 tablet (500 mg total) by mouth 2 (two) times daily for 7 days.    Dispense:  20 tablet    Refill:  0    Order Specific Question:   Supervising Provider    Answer:   Danise Edge A [[id number]]    ciprofloxacin (CIPRO) 500 MG tablet    Sig: Take 1 tablet (500 mg total) by mouth 2 (two) times daily.    Dispense:  20 tablet    Refill:  0    Order Specific Question:   Supervising Provider    Answer:   Danise Edge A [[id number]]    I, Lauren Fillers, NP, personally preformed the services described in this documentation.  All medical record entries made by the scribe were at my direction and in my presence.  I have reviewed the chart and discharge instructions (if applicable) and agree that the record reflects my personal performance and is accurate and complete. 06/12/2022.  I,Lauren Walters,acting as a Neurosurgeon for [employer], NP.,have documented all relevant documentation on the behalf of Lauren Fillers, NP,as directed by  Lauren Fillers, NP while in the presence of Lauren Fillers, NP.   Lauren Fillers, NP

## 2022-06-12 NOTE — Assessment & Plan Note (Signed)
New. She has hx of diverticulosis on colonoscopy.  Hx concerning for diverticulitis.  Will begin empiric cipro/flagyl and order a CT abd/pelvis for further evaluation. She is advised not to drink alcohol while taking metronidazole. Also advised to go to the ER if she develops increased abdominal pain or fever.

## 2022-06-13 ENCOUNTER — Encounter: Payer: Self-pay | Admitting: Family

## 2022-06-17 ENCOUNTER — Encounter: Payer: Self-pay | Admitting: Family

## 2022-06-19 ENCOUNTER — Ambulatory Visit: Payer: No Typology Code available for payment source | Admitting: Family

## 2022-06-24 ENCOUNTER — Other Ambulatory Visit: Payer: Self-pay | Admitting: Family

## 2022-08-17 ENCOUNTER — Other Ambulatory Visit: Payer: Self-pay | Admitting: Family

## 2022-08-17 MED ORDER — BETAMETHASONE VALERATE 0.1 % EX OINT: 1.0000 | TOPICAL_OINTMENT | Freq: Two times a day (BID) | CUTANEOUS | 0 refills | Status: AC

## 2022-09-26 ENCOUNTER — Encounter (INDEPENDENT_AMBULATORY_CARE_PROVIDER_SITE_OTHER): Payer: Self-pay

## 2022-09-28 ENCOUNTER — Other Ambulatory Visit: Payer: Self-pay | Admitting: Family

## 2022-10-08 ENCOUNTER — Encounter: Payer: PRIVATE HEALTH INSURANCE | Admitting: Family

## 2022-10-22 ENCOUNTER — Encounter: Payer: Self-pay | Admitting: Family

## 2022-10-22 DIAGNOSIS — S20361A Insect bite (nonvenomous) of right front wall of thorax, initial encounter: Secondary | ICD-10-CM

## 2022-10-22 MED ORDER — PREDNISONE 10 MG PO TABS: ORAL_TABLET | ORAL | 0 refills | Status: DC

## 2022-10-22 MED ORDER — HYDROXYZINE HCL 25 MG PO TABS: 25.0000 mg | ORAL_TABLET | Freq: Three times a day (TID) | ORAL | 0 refills | Status: DC | PRN

## 2022-10-22 NOTE — Telephone Encounter (Signed)
Please see the MyChart message reply(ies) for my assessment and plan.  The patient gave consent for this Medical Advice Message and is aware that it may result in a bill to their insurance company as well as the possibility that this may result in a co-payment or deductible. They are an established patient, but are not seeking medical advice exclusively about a problem treated during an in person or video visit in the last 7 days. I did not recommend an in person or video visit within 7 days of my reply.  I spent a total of 5 minutes cumulative provider time within 7 days through [employer].  Nance Pear, NP

## 2022-10-23 ENCOUNTER — Encounter: Payer: PRIVATE HEALTH INSURANCE | Admitting: Family

## 2022-10-24 ENCOUNTER — Encounter: Payer: Self-pay | Admitting: Family

## 2022-10-24 ENCOUNTER — Other Ambulatory Visit (HOSPITAL_COMMUNITY)
Admission: RE | Admit: 2022-10-24 | Discharge: 2022-10-24 | Disposition: A | Discharge status: Home / Self Care | Payer: No Typology Code available for payment source | Source: Ambulatory Visit | Attending: Family | Admitting: Family

## 2022-10-24 ENCOUNTER — Ambulatory Visit: Payer: No Typology Code available for payment source | Admitting: Family

## 2022-10-24 VITALS — BP 139/62 | HR 58 | Temp 97.5°F | Resp 16 | Ht 70.0 in | Wt 182.0 lb

## 2022-10-24 DIAGNOSIS — Z1231 Encounter for screening mammogram for malignant neoplasm of breast: Secondary | ICD-10-CM

## 2022-10-24 DIAGNOSIS — Z01419 Encounter for gynecological examination (general) (routine) without abnormal findings: Secondary | ICD-10-CM | POA: Diagnosis not present

## 2022-10-24 DIAGNOSIS — M8580 Other specified disorders of bone density and structure, unspecified site: Secondary | ICD-10-CM | POA: Diagnosis not present

## 2022-10-24 DIAGNOSIS — Z0000 Encounter for general adult medical examination without abnormal findings: Secondary | ICD-10-CM

## 2022-10-24 NOTE — Progress Notes (Signed)
Subjective:     Patient ID: Lauren Walters, female    DOB: [birth year], 62 y.o.   MRN: [id number]  Chief Complaint  Patient presents with   Annual Exam    HPI  Discussed the use of AI scribe software for clinical note transcription with the patient, who gave verbal consent to proceed.  History of Present Illness         Immunizations: flu/covid vaccine duelTetanus and shingles up to date.  Diet: healthy Exercise: healthy Colonoscopy: 05/05/21 Dexa: 2020 osteopenia Pap Smear: due Mammogram: 12/22 Vision: up to date Dental: up to date     Health Maintenance Due  Topic Date Due   COVID-19 Vaccine (5 - 2023-24 season) 04/08/2022   PAP SMEAR-Modifier  09/18/2022   INFLUENZA VACCINE  09/27/2022   Lung Cancer Screening  11/14/2022    Past Medical History:  Diagnosis Date   Adhesive capsulitis of right shoulder 09/24/2016   Arthritis    Atypical chest pain 11/11/2015   DEGENERATIVE JOINT DISEASE, RIGHT HIP 11/11/2008   Qualifier: Diagnosis of  By: Darrick Penna MD, KARL     GERD (gastroesophageal reflux disease)    History of chicken pox    HTLVTYPE I CCE & UNS SITE 07/08/2007   Qualifier: Diagnosis of  By: Drue Novel MD, Jose E.    HTN (hypertension) 03/05/2015   Inclusion cyst 02/17/2018   Left knee pain 09/24/2016   Neck pain 01/30/2015   Osteopenia    Physical exam 12/18/2013   Postmenopausal HRT (hormone replacement therapy) 07/17/2013   Preventative health care 01/30/2015   Skin tag 04/24/2011   Tinnitus of left ear 09/27/2017   Last Assessment & Plan:  Formatting of this note might be different from the original. Concern over ringing at the left ear. 74-month history.  No associated vertigo.  Some associated ear fullness.  No history of ear trauma surgery or infection.  No major history of noise exposure. EXAM shows normal external canals and tympanic membranes. PLAN: We discussed probably there is some inflammation.  We    Varicose veins 04/24/2011    Past Surgical  History:  Procedure Laterality Date   CESAREAN SECTION  1995   CESAREAN SECTION  1997   CHOLECYSTECTOMY  1991   COLONOSCOPY  03/2015   Dr.Nandigam   JOINT REPLACEMENT     R hip   OVARIAN CYST REMOVAL  1990   ROOT CANAL     TOTAL HIP ARTHROPLASTY  2011   TOTAL HIP ARTHROPLASTY Left 2024   WISDOM TOOTH EXTRACTION      Family History  Problem Relation Age of Onset   Dementia Mother    Hypertension Mother    CVA Mother    Aortic stenosis Mother    Alcohol abuse Father        deceased   Cancer Father        prostate, basal cell    Arthritis Father    Heart disease Father    Lymphoma Brother    Heart disease Maternal Grandfather    Arthritis Paternal Grandfather    Heart disease Paternal Grandfather    Colon cancer Neg Hx    Colon polyps Neg Hx    Esophageal cancer Neg Hx    Stomach cancer Neg Hx    Rectal cancer Neg Hx     Social History   Socioeconomic History   Marital status: Married    Spouse name: Not on file   Number of children: 3   Years of education:  Not on file   Highest education level: Not on file  Occupational History   Occupation: Medical Coder    Comment: [employer]  Tobacco Use   Smoking status: Former    Current packs/day: 0.00    Average packs/day: 0.8 packs/day for 30.0 years (22.5 ttl pk-yrs)    Types: Cigarettes    Start date: 05/16/1985    Quit date: 05/17/2015    Years since quitting: 7.4   Smokeless tobacco: Never  Vaping Use   Vaping status: Never Used  Substance and Sexual Activity   Alcohol use: Yes    Alcohol/week: 14.0 standard drinks of alcohol    Types: 14 Glasses of wine per week    Comment: 2 glasses of wine a night   Drug use: No   Sexual activity: Yes    Partners: Male    Birth control/protection: None  Other Topics Concern   Not on file  Social History Narrative   92- daughter   34-son   32- daughter      Youngest at home   Married   Biking/tennis/swimming, reading   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 : Never true  Transportation Needs: Not on file (07/12/2022)  Physical Activity: Not on file  Stress: Not on file  Social Connections: Not on file  Intimate Partner Violence: Not on file    Outpatient Medications Prior to Visit  Medication Sig Dispense Refill   betamethasone valerate ointment (VALISONE) 0.1 % Apply 1 Application topically 2 (two) times daily. 30 g 0   Calcium Carbonate-Vitamin D 600-400 MG-UNIT chew tablet Chew 1 tablet by mouth 2 (two) times daily.     cetirizine (ZYRTEC) 10 MG tablet Take 1 tablet (10 mg total) by mouth daily. 30 tablet 11   estradiol (ESTRACE) 0.1 MG/GM vaginal cream Place 0.5g nightly for two weeks then twice a week after 30 g 11   hydrochlorothiazide (HYDRODIURIL) 25 MG tablet TAKE 1 TABLET BY MOUTH DAILY 90 tablet 1   lisinopril (ZESTRIL) 5 MG tablet TAKE 1 TABLET BY MOUTH DAILY 90 tablet 1   omeprazole (PRILOSEC) 40 MG capsule Take 40 mg by mouth daily as needed.     predniSONE (DELTASONE) 10 MG tablet 4 tabs by mouth once daily for 2 days, then 3 tabs daily x 2 days, then 2 tabs daily x 2 days, then 1 tab daily x 2 days 20 tablet 0   ciprofloxacin (CIPRO) 500 MG tablet Take 1 tablet (500 mg total) by mouth 2 (two) times daily. 20 tablet 0   hydrOXYzine (ATARAX) 25 MG tablet Take 1 tablet (25 mg total) by mouth 3 (three) times daily as needed. 20 tablet 0   meloxicam (MOBIC) 15 MG tablet      No facility-administered medications prior to visit.    Allergies  Allergen Reactions   Metoprolol Tartrate Palpitations and Other (See Comments)    Chest pain,  increased palpitations    Review of Systems  Constitutional:  Positive for weight loss.  HENT:  Negative  for congestion and hearing loss.   Eyes:  Negative for blurred vision.  Respiratory:  Negative for cough.   Cardiovascular:  Negative for leg swelling.  Gastrointestinal:  Negative for constipation and diarrhea.  Genitourinary:  Negative for dysuria and frequency.  Musculoskeletal:  Negative for joint pain.  Skin:  Positive for rash (chest).  Neurological:  Negative for headaches.  Psychiatric/Behavioral:  Negative for depression. The patient is not nervous/anxious.        Objective:    Physical Exam   BP 139/62 (BP Location: Right Arm, Patient Position: Sitting, Cuff Size: Small)   Pulse (!) 58   Temp (!) 97.5 F (36.4 C) (Oral)   Resp 16   Ht 5\' 10"  (1.778 m)   Wt 182 lb (82.6 kg)   SpO2 100%   BMI 26.11 kg/m  Wt Readings from Last 3 Encounters:  10/24/22 182 lb (82.6 kg)  06/12/22 192 lb (87.1 kg)  04/04/22 186 lb 6.4 oz (84.6 kg)  Physical Exam  Constitutional: She is oriented to person, place, and time. She appears well-developed and well-nourished. No distress.  HENT:  Head: Normocephalic and atraumatic.  Right Ear: Tympanic membrane and ear canal normal.  Left Ear: Tympanic membrane and ear canal normal.  Mouth/Throat: Oropharynx is clear and moist.  Eyes: Pupils are equal, round, and reactive to light. No scleral icterus.  Neck: Normal range of motion. No thyromegaly present.  Cardiovascular: Normal rate and regular rhythm.   No murmur heard. Pulmonary/Chest: Effort normal and breath sounds normal. No respiratory distress. He has no wheezes. She has no rales. She exhibits no tenderness.  Abdominal: Soft. Bowel sounds are normal. She exhibits no distension and no mass. There is no tenderness. There is no rebound and no guarding.  Musculoskeletal: She exhibits no edema.  Lymphadenopathy:    She has no cervical adenopathy.  Neurological: She is alert and  oriented to person, place, and time. She has normal patellar reflexes. She exhibits normal muscle tone. Coordination normal.  Skin: Skin is warm and dry.  Psychiatric: She has a normal mood and affect. Her behavior is normal. Judgment and thought content normal.  Breasts: Examined lying Right: Without masses, retractions, discharge or axillary adenopathy.  Left: Without masses, retractions, discharge or axillary adenopathy.  Inguinal/mons: Normal without inguinal adenopathy  External genitalia: Normal  BUS/Urethra/Skene's glands: Normal  Bladder: Normal  Vagina: Normal  Cervix: Normal  Uterus: normal in size, shape and contour. Midline and mobile  Adnexa/parametria:  Rt: Without masses or tenderness.  Lt: Without masses or tenderness.  Anus and perineum: Normal            Assessment & Plan:        Assessment & Plan:   Problem List Items Addressed This Visit       Unprioritized   Preventative health care    Continue healthy diet, exercise and weight loss efforts. Update mammogram. Pap performed today. Update dexa. Colo up to date. Recommended flu/covid booster at the pharmacy.      Osteopenia   Relevant Orders   DG Bone Density   Other Visit Diagnoses     Breast cancer screening by mammogram    -  Primary   Relevant Orders   MM 3D SCREENING MAMMOGRAM BILATERAL BREAST   Encounter for routine gynecological examination with Papanicolaou smear of cervix       Relevant Orders   Cytology - PAP( [facility]a)       I have discontinued Kess A. Fallas's  meloxicam, ciprofloxacin, and hydrOXYzine. I am also having her maintain her cetirizine, Calcium Carbonate-Vitamin D, omeprazole, estradiol, hydrochlorothiazide, betamethasone valerate ointment, lisinopril, and predniSONE.  No orders of the defined types were placed in this encounter.

## 2022-10-24 NOTE — Assessment & Plan Note (Signed)
Continue healthy diet, exercise and weight loss efforts. Update mammogram. Pap performed today. Update dexa. Colo up to date. Recommended flu/covid booster at the pharmacy.

## 2022-10-24 NOTE — Patient Instructions (Addendum)
Continue your healthy diet and regular exercise. Please schedule your mammogram and bone density in the imaging department.  Please update your covid and flu shots at the pharmacy.

## 2022-11-01 LAB — CYTOLOGY - PAP
Comment: NEGATIVE
Diagnosis: NEGATIVE
High risk HPV: NEGATIVE

## 2022-11-02 ENCOUNTER — Telehealth (HOSPITAL_BASED_OUTPATIENT_CLINIC_OR_DEPARTMENT_OTHER): Payer: Self-pay

## 2022-11-26 ENCOUNTER — Encounter: Payer: Self-pay | Admitting: Family

## 2022-11-29 ENCOUNTER — Other Ambulatory Visit: Payer: Self-pay | Admitting: Family

## 2022-11-29 ENCOUNTER — Encounter: Payer: Self-pay | Admitting: Family

## 2022-11-30 MED ORDER — AMOXICILLIN 500 MG PO CAPS: 2000.0000 mg | ORAL_CAPSULE | Freq: Once | ORAL | 0 refills | Status: DC

## 2022-12-03 ENCOUNTER — Encounter (HOSPITAL_BASED_OUTPATIENT_CLINIC_OR_DEPARTMENT_OTHER): Payer: Self-pay

## 2022-12-03 ENCOUNTER — Ambulatory Visit (HOSPITAL_BASED_OUTPATIENT_CLINIC_OR_DEPARTMENT_OTHER)
Admission: RE | Admit: 2022-12-03 | Discharge: 2022-12-03 | Disposition: A | Discharge status: Home / Self Care | Payer: No Typology Code available for payment source | Source: Ambulatory Visit | Attending: Family | Admitting: Family

## 2022-12-03 ENCOUNTER — Other Ambulatory Visit (HOSPITAL_BASED_OUTPATIENT_CLINIC_OR_DEPARTMENT_OTHER): Payer: Self-pay

## 2022-12-03 DIAGNOSIS — E2839 Other primary ovarian failure: Secondary | ICD-10-CM | POA: Diagnosis present

## 2022-12-03 DIAGNOSIS — Z1231 Encounter for screening mammogram for malignant neoplasm of breast: Secondary | ICD-10-CM | POA: Diagnosis present

## 2022-12-03 DIAGNOSIS — Z780 Asymptomatic menopausal state: Secondary | ICD-10-CM | POA: Diagnosis not present

## 2022-12-03 DIAGNOSIS — M8580 Other specified disorders of bone density and structure, unspecified site: Secondary | ICD-10-CM

## 2022-12-03 MED ORDER — COVID-19 MRNA VAC-TRIS(PFIZER) 30 MCG/0.3ML IM SUSY
0.3000 mL | PREFILLED_SYRINGE | Freq: Once | INTRAMUSCULAR | 0 refills | Status: AC
  Filled 2022-12-03: qty 0.3, 1d supply, fill #0

## 2022-12-03 MED ORDER — INFLUENZA VIRUS VACC SPLIT PF (FLUZONE) 0.5 ML IM SUSY
0.5000 mL | PREFILLED_SYRINGE | Freq: Once | INTRAMUSCULAR | 0 refills | Status: AC
  Filled 2022-12-03: qty 0.5, 1d supply, fill #0

## 2022-12-25 ENCOUNTER — Ambulatory Visit: Payer: No Typology Code available for payment source | Admitting: Family

## 2022-12-25 ENCOUNTER — Ambulatory Visit (HOSPITAL_BASED_OUTPATIENT_CLINIC_OR_DEPARTMENT_OTHER)
Admission: RE | Admit: 2022-12-25 | Discharge: 2022-12-25 | Disposition: A | Discharge status: Home / Self Care | Payer: No Typology Code available for payment source | Source: Ambulatory Visit | Attending: Family | Admitting: Family

## 2022-12-25 ENCOUNTER — Encounter: Payer: Self-pay | Admitting: Family

## 2022-12-25 VITALS — BP 139/64 | HR 66 | Temp 98.7°F | Resp 16 | Ht 70.0 in | Wt 186.0 lb

## 2022-12-25 DIAGNOSIS — R059 Cough, unspecified: Secondary | ICD-10-CM | POA: Insufficient documentation

## 2022-12-25 DIAGNOSIS — J189 Pneumonia, unspecified organism: Secondary | ICD-10-CM

## 2022-12-25 MED ORDER — GUAIFENESIN-CODEINE 100-10 MG/5ML PO SYRP: 5.0000 mL | ORAL_SOLUTION | Freq: Three times a day (TID) | ORAL | 0 refills | Status: DC | PRN

## 2022-12-25 MED ORDER — BENZONATATE 100 MG PO CAPS: 100.0000 mg | ORAL_CAPSULE | Freq: Two times a day (BID) | ORAL | 0 refills | Status: DC | PRN

## 2022-12-25 MED ORDER — AMLODIPINE BESYLATE 2.5 MG PO TABS: 2.5000 mg | ORAL_TABLET | Freq: Every day | ORAL | 0 refills | Status: DC

## 2022-12-25 NOTE — Progress Notes (Unsigned)
Subjective:     Patient ID: Lauren Walters, female    DOB: [birth year], 62 y.o.   MRN: [id number]  Chief Complaint  Patient presents with   Cough    Complains of cough that started about 2 weeks ago    Cough Pertinent negatives include no chest pain, chills, fever, headaches or weight loss.    Discussed the use of AI scribe software for clinical note transcription with the patient, who gave verbal consent to proceed.    Started about 2 weeks ago and is just getting worse ove rthe last two days. No mucus production. No chest congestion. Cough is worse at night. Denies being around anyone sick that she is aware of. Tried Delsym for a day or two but it did not help her cough. Back is hurting from how much she is coughing. She is using halls cough drops to help with the cough but it is not helping much.   She states that lying down makes the cough worse.     Health Maintenance Due  Topic Date Due   Lung Cancer Screening  11/14/2022    Past Medical History:  Diagnosis Date   Adhesive capsulitis of right shoulder 09/24/2016   Arthritis    Atypical chest pain 11/11/2015   DEGENERATIVE JOINT DISEASE, RIGHT HIP 11/11/2008   Qualifier: Diagnosis of  By: Darrick Penna MD, KARL     GERD (gastroesophageal reflux disease)    History of chicken pox    HTLVTYPE I CCE & UNS SITE 07/08/2007   Qualifier: Diagnosis of  By: Drue Novel MD, Jose E.    HTN (hypertension) 03/05/2015   Inclusion cyst 02/17/2018   Left knee pain 09/24/2016   Neck pain 01/30/2015   Osteopenia    Physical exam 12/18/2013   Postmenopausal HRT (hormone replacement therapy) 07/17/2013   Preventative health care 01/30/2015   Skin tag 04/24/2011   Tinnitus of left ear 09/27/2017   Last Assessment & Plan:  Formatting of this note might be different from the original. Concern over ringing at the left ear. 80-month history.  No associated vertigo.  Some associated ear fullness.  No history of ear trauma surgery or infection.  No  major history of noise exposure. EXAM shows normal external canals and tympanic membranes. PLAN: We discussed probably there is some inflammation.  We    Varicose veins 04/24/2011    Past Surgical History:  Procedure Laterality Date   CESAREAN SECTION  1995   CESAREAN SECTION  1997   CHOLECYSTECTOMY  1991   COLONOSCOPY  03/2015   Dr.Nandigam   JOINT REPLACEMENT     R hip   OVARIAN CYST REMOVAL  1990   ROOT CANAL     TOTAL HIP ARTHROPLASTY  2011   TOTAL HIP ARTHROPLASTY Left 2024   WISDOM TOOTH EXTRACTION      Family History  Problem Relation Age of Onset   Dementia Mother    Hypertension Mother    CVA Mother    Aortic stenosis Mother    Alcohol abuse Father        deceased   Cancer Father        prostate, basal cell    Arthritis Father    Heart disease Father    Lymphoma Brother    Heart disease Maternal Grandfather    Arthritis Paternal Grandfather    Heart disease Paternal Grandfather    Colon cancer Neg Hx    Colon polyps Neg Hx    Esophageal cancer Neg  Hx    Stomach cancer Neg Hx    Rectal cancer Neg Hx     Social History   Socioeconomic History   Marital status: Married    Spouse name: Not on file   Number of children: 3   Years of education: Not on file   Highest education level: Not on file  Occupational History   Occupation: Medical Coder    Comment: [employer]  Tobacco Use   Smoking status: Former    Current packs/day: 0.00    Average packs/day: 0.8 packs/day for 30.0 years (22.5 ttl pk-yrs)    Types: Cigarettes    Start date: 05/16/1985    Quit date: 05/17/2015    Years since quitting: 7.6   Smokeless tobacco: Never  Vaping Use   Vaping status: Never Used  Substance and Sexual Activity   Alcohol use: Yes    Alcohol/week: 14.0 standard drinks of alcohol    Types: 14 Glasses of wine per week    Comment: 2 glasses of wine a night   Drug use: No   Sexual activity: Yes    Partners: Male    Birth control/protection: None  Other Topics  Concern   Not on file  Social History Narrative   26- daughter   55-son   61- daughter      Youngest at home   Married   Biking/tennis/swimming, reading   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)    Social Determinants of Health   Financial Resource Strain: Not on file  Food Insecurity: Low Risk  (07/12/2022)   [facility]   Hunger Vital Sign    Worried About Running Out of Food in the Last Year: Never true    Ran Out of Food in the Last Year: Never true  Transportation Needs: Not on file (07/12/2022)  Physical Activity: Not on file  Stress: Not on file  Social Connections: Not on file  Intimate Partner Violence: Not on file    Outpatient Medications Prior to Visit  Medication Sig Dispense Refill   betamethasone valerate ointment (VALISONE) 0.1 % Apply 1 Application topically 2 (two) times daily. 30 g 0   Calcium Carbonate-Vitamin D 600-400 MG-UNIT chew tablet Chew 1 tablet by mouth 2 (two) times daily.     cetirizine (ZYRTEC) 10 MG tablet Take 1 tablet (10 mg total) by mouth daily. 30 tablet 11   estradiol (ESTRACE) 0.1 MG/GM vaginal cream Place 0.5g nightly for two weeks then twice a week after 30 g 11   hydrochlorothiazide (HYDRODIURIL) 25 MG tablet TAKE 1 TABLET BY MOUTH DAILY 90 tablet 1   lisinopril (ZESTRIL) 5 MG tablet TAKE 1 TABLET BY MOUTH DAILY 90 tablet 1   omeprazole (PRILOSEC) 40 MG capsule Take 40 mg by mouth daily as needed.     predniSONE (DELTASONE) 10 MG tablet 4 tabs by mouth once daily for 2 days, then 3 tabs daily x 2 days, then 2 tabs daily x 2 days, then 1 tab daily x 2 days 20 tablet 0   No facility-administered medications prior to visit.    Allergies  Allergen Reactions   Metoprolol Tartrate Palpitations and Other (See Comments)    Chest pain, increased palpitations    Review of Systems  Constitutional:  Negative for chills, fever and weight loss.  Respiratory:  Positive for cough.   Cardiovascular:  Negative for  chest pain and palpitations.  Musculoskeletal:  Positive for back pain (noted from the cough).  Neurological:  Negative  for dizziness and headaches.  Psychiatric/Behavioral:  Negative for depression.        Objective:    Physical Exam Constitutional:      Appearance: Normal appearance. She is normal weight.  HENT:     Head: Normocephalic.  Cardiovascular:     Rate and Rhythm: Normal rate and regular rhythm.     Pulses: Normal pulses.     Heart sounds: Normal heart sounds.  Pulmonary:     Effort: Pulmonary effort is normal.     Breath sounds: Normal breath sounds.  Musculoskeletal:        General: Normal range of motion.     Cervical back: Normal range of motion.  Neurological:     General: No focal deficit present.     Mental Status: She is alert and oriented to person, place, and time. Mental status is at baseline.  Psychiatric:        Mood and Affect: Mood normal.        Behavior: Behavior normal.        Thought Content: Thought content normal.        Judgment: Judgment normal.      BP 139/64 (BP Location: Right Arm, Patient Position: Sitting, Cuff Size: Normal)   Pulse 66   Temp 98.7 F (37.1 C) (Oral)   Resp 16   Ht 5\' 10"  (1.778 m)   Wt 186 lb (84.4 kg)   SpO2 99%   BMI 26.69 kg/m  Wt Readings from Last 3 Encounters:  12/25/22 186 lb (84.4 kg)  10/24/22 182 lb (82.6 kg)  06/12/22 192 lb (87.1 kg)       Assessment & Plan:   Problem List Items Addressed This Visit   None   I have discontinued Aurella A. Briley's predniSONE. I am also having her maintain her cetirizine, Calcium Carbonate-Vitamin D, omeprazole, estradiol, hydrochlorothiazide, betamethasone valerate ointment, and lisinopril.  No orders of the defined types were placed in this encounter.

## 2022-12-25 NOTE — Progress Notes (Unsigned)
Subjective:     Patient ID: Lauren Walters, female    DOB: 08-Nov-1960, 62 y.o.   MRN: [id number]  Chief Complaint  Patient presents with   Cough    Complains of cough that started about 2 weeks ago    HPI  Discussed the use of AI scribe software for clinical note transcription with the patient, who gave verbal consent to proceed.  History of Present Illness   The patient, with a history of hypertension managed with lisinopril, presents with a persistent cough that started two weeks ago. Initially, the cough was mild and attributed to environmental factors. However, over the past week, the cough has worsened, particularly at night, disrupting the patient's sleep. The patient denies postnasal drip, significant reflux symptoms, and wheezing. She reports a tickling sensation in the throat preceding the cough. The patient also mentions a mild, intermittent headache but denies any unusual congestion. Despite the cough, the patient has been able to maintain her physical activity, including a recent five-mile hike.          Health Maintenance Due  Topic Date Due   Lung Cancer Screening  11/14/2022    Past Medical History:  Diagnosis Date   Adhesive capsulitis of right shoulder 09/24/2016   Arthritis    Atypical chest pain 11/11/2015   DEGENERATIVE JOINT DISEASE, RIGHT HIP 11/11/2008   Qualifier: Diagnosis of  By: Darrick Penna MD, KARL     GERD (gastroesophageal reflux disease)    History of chicken pox    HTLVTYPE I CCE & UNS SITE 07/08/2007   Qualifier: Diagnosis of  By: Drue Novel MD, Jose E.    HTN (hypertension) 03/05/2015   Inclusion cyst 02/17/2018   Left knee pain 09/24/2016   Neck pain 01/30/2015   Osteopenia    Physical exam 12/18/2013   Postmenopausal HRT (hormone replacement therapy) 07/17/2013   Preventative health care 01/30/2015   Skin tag 04/24/2011   Tinnitus of left ear 09/27/2017   Last Assessment & Plan:  Formatting of this note might be different from the  original. Concern over ringing at the left ear. 30-month history.  No associated vertigo.  Some associated ear fullness.  No history of ear trauma surgery or infection.  No major history of noise exposure. EXAM shows normal external canals and tympanic membranes. PLAN: We discussed probably there is some inflammation.  We    Varicose veins 04/24/2011    Past Surgical History:  Procedure Laterality Date   CESAREAN SECTION  1995   CESAREAN SECTION  1997   CHOLECYSTECTOMY  1991   COLONOSCOPY  03/2015   Dr.Nandigam   JOINT REPLACEMENT     R hip   OVARIAN CYST REMOVAL  1990   ROOT CANAL     TOTAL HIP ARTHROPLASTY  2011   TOTAL HIP ARTHROPLASTY Left 2024   WISDOM TOOTH EXTRACTION      Family History  Problem Relation Age of Onset   Dementia Mother    Hypertension Mother    CVA Mother    Aortic stenosis Mother    Alcohol abuse Father        deceased   Cancer Father        prostate, basal cell    Arthritis Father    Heart disease Father    Lymphoma Brother    Heart disease Maternal Grandfather    Arthritis Paternal Grandfather    Heart disease Paternal Grandfather    Colon cancer Neg Hx    Colon polyps Neg Hx  Esophageal cancer Neg Hx    Stomach cancer Neg Hx    Rectal cancer Neg Hx     Social History   Socioeconomic History   Marital status: Married    Spouse name: Not on file   Number of children: 3   Years of education: Not on file   Highest education level: Not on file  Occupational History   Occupation: Medical Coder    Comment: [employer]  Tobacco Use   Smoking status: Former    Current packs/day: 0.00    Average packs/day: 0.8 packs/day for 30.0 years (22.5 ttl pk-yrs)    Types: Cigarettes    Start date: 05/16/1985    Quit date: 05/17/2015    Years since quitting: 7.6   Smokeless tobacco: Never  Vaping Use   Vaping status: Never Used  Substance and Sexual Activity   Alcohol use: Yes    Alcohol/week: 14.0 standard drinks of alcohol    Types: 14  Glasses of wine per week    Comment: 2 glasses of wine a night   Drug use: No   Sexual activity: Yes    Partners: Male    Birth control/protection: None  Other Topics Concern   Not on file  Social History Narrative   34- daughter   39-son   6- daughter      Youngest at home   Married   Biking/tennis/swimming, reading   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)    Social Determinants of Health   Financial Resource Strain: Not on file  Food Insecurity: Low Risk  (07/12/2022)   [facility]   Hunger Vital Sign    Worried About Running Out of Food in the Last Year: Never true    Ran Out of Food in the Last Year: Never true  Transportation Needs: Not on file (07/12/2022)  Physical Activity: Not on file  Stress: Not on file  Social Connections: Not on file  Intimate Partner Violence: Not on file    Outpatient Medications Prior to Visit  Medication Sig Dispense Refill   betamethasone valerate ointment (VALISONE) 0.1 % Apply 1 Application topically 2 (two) times daily. 30 g 0   Calcium Carbonate-Vitamin D 600-400 MG-UNIT chew tablet Chew 1 tablet by mouth 2 (two) times daily.     cetirizine (ZYRTEC) 10 MG tablet Take 1 tablet (10 mg total) by mouth daily. 30 tablet 11   estradiol (ESTRACE) 0.1 MG/GM vaginal cream Place 0.5g nightly for two weeks then twice a week after 30 g 11   hydrochlorothiazide (HYDRODIURIL) 25 MG tablet TAKE 1 TABLET BY MOUTH DAILY 90 tablet 1   omeprazole (PRILOSEC) 40 MG capsule Take 40 mg by mouth daily as needed.     lisinopril (ZESTRIL) 5 MG tablet TAKE 1 TABLET BY MOUTH DAILY 90 tablet 1   predniSONE (DELTASONE) 10 MG tablet 4 tabs by mouth once daily for 2 days, then 3 tabs daily x 2 days, then 2 tabs daily x 2 days, then 1 tab daily x 2 days 20 tablet 0   No facility-administered medications prior to visit.    Allergies  Allergen Reactions   Metoprolol Tartrate Palpitations and Other (See Comments)    Chest pain,  increased palpitations    ROS See HPI    Objective:    Physical Exam Constitutional:      General: She is not in acute distress.    Appearance: Normal appearance. She is well-developed.  HENT:     Head:  Normocephalic and atraumatic.     Right Ear: Tympanic membrane, ear canal and external ear normal.     Left Ear: Tympanic membrane, ear canal and external ear normal.  Eyes:     General: No scleral icterus. Neck:     Thyroid: No thyromegaly.  Cardiovascular:     Rate and Rhythm: Normal rate and regular rhythm.     Heart sounds: Normal heart sounds. No murmur heard. Pulmonary:     Effort: Pulmonary effort is normal. No respiratory distress.     Breath sounds: Normal breath sounds. No wheezing.  Musculoskeletal:     Cervical back: Neck supple.  Lymphadenopathy:     Cervical: No cervical adenopathy.  Skin:    General: Skin is warm and dry.  Neurological:     Mental Status: She is alert and oriented to person, place, and time.  Psychiatric:        Mood and Affect: Mood normal.        Behavior: Behavior normal.        Thought Content: Thought content normal.        Judgment: Judgment normal.      BP 139/64 (BP Location: Right Arm, Patient Position: Sitting, Cuff Size: Normal)   Pulse 66   Temp 98.7 F (37.1 C) (Oral)   Resp 16   Ht 5\' 10"  (1.778 m)   Wt 186 lb (84.4 kg)   SpO2 99%   BMI 26.69 kg/m  Wt Readings from Last 3 Encounters:  12/25/22 186 lb (84.4 kg)  10/24/22 182 lb (82.6 kg)  06/12/22 192 lb (87.1 kg)       Assessment & Plan:   Problem List Items Addressed This Visit       Unprioritized   Cough - Primary    Initially I was thinking that her cough might be ace related and we discussed changing lisinopril to amlodipine.  However in the setting of pneumonia diagnosis that is most likely cause for cough. Continue lisinopril.       Relevant Orders   DG Chest 2 View (Completed)   Community acquired pneumonia of right middle lobe of lung    CXR  notes RML PNA.  Will rx with azithromycin and Vantin.  Pt is advised  to send me a message in 1 week letting me know how she is feeling and to mark her calendar to return to imaging in 1 month repeat cxr to ensure complete resolution of infiltrate. She is advised [place] to call us if new/worsening symptoms occur. Pt verbalizes understanding.       Relevant Medications   guaiFENesin-codeine (ROBITUSSIN AC) 100-10 MG/5ML syrup    I have discontinued Makaiyla A. Sheard's lisinopril, predniSONE, and benzonatate. I am also having her start on guaiFENesin-codeine. Additionally, I am having her maintain her cetirizine, Calcium Carbonate-Vitamin D, omeprazole, estradiol, hydrochlorothiazide, and betamethasone valerate ointment.  Meds ordered this encounter  Medications   DISCONTD: benzonatate (TESSALON) 100 MG capsule    Sig: Take 1 capsule (100 mg total) by mouth 2 (two) times daily as needed for cough.    Dispense:  20 capsule    Refill:  0   DISCONTD: amLODipine (NORVASC) 2.5 MG tablet    Sig: Take 1 tablet (2.5 mg total) by mouth daily.    Dispense:  90 tablet    Refill:  0    Order Specific Question:   Supervising Provider    Answer:   Danise Edge A [[id number]]   guaiFENesin-codeine (ROBITUSSIN AC) 100-10  MG/5ML syrup    Sig: Take 5 mLs by mouth 3 (three) times daily as needed for cough.    Dispense:  75 mL    Refill:  0    Order Specific Question:   Supervising Provider    Answer:   Danise Edge A [[id number]]

## 2022-12-26 ENCOUNTER — Telehealth: Payer: Self-pay | Admitting: Family

## 2022-12-26 DIAGNOSIS — J189 Pneumonia, unspecified organism: Secondary | ICD-10-CM

## 2022-12-26 DIAGNOSIS — R059 Cough, unspecified: Secondary | ICD-10-CM | POA: Insufficient documentation

## 2022-12-26 MED ORDER — AZITHROMYCIN 250 MG PO TABS: ORAL_TABLET | ORAL | 0 refills | Status: AC

## 2022-12-26 MED ORDER — CEFPODOXIME PROXETIL 200 MG PO TABS: 200.0000 mg | ORAL_TABLET | Freq: Two times a day (BID) | ORAL | 0 refills | Status: DC

## 2022-12-26 NOTE — Assessment & Plan Note (Signed)
Initially I was thinking that her cough might be ace related and we discussed changing lisinopril to amlodipine.  However in the setting of pneumonia diagnosis that is most likely cause for cough. Continue lisinopril.

## 2022-12-26 NOTE — Assessment & Plan Note (Addendum)
CXR notes RML PNA.  Will rx with azithromycin and Vantin.  Pt is advised  to send me a message in 1 week letting me know how she is feeling and to mark her calendar to return to imaging in 1 month repeat cxr to ensure complete resolution of infiltrate. She is advised [place] to call us if new/worsening symptoms occur. Pt verbalizes understanding.

## 2022-12-26 NOTE — Telephone Encounter (Signed)
Spoke to patient.  Advised her about the finding or RML PNA on chest x-ray and need to start abx that have been sent to her pharmacy. Advised her to send me a message in 1 week letting me know how she is feeling and to mark her calendar to return to imaging in 1 month to ensure complete resolution of infiltrate. She is advised [place] to call us if new/worsening symptoms occur. Pt verbalizes understanding.

## 2022-12-26 NOTE — Assessment & Plan Note (Signed)
   Initially, I was suspicious that her ACE was contributing. Now that we have dx of PNA, I think it is fine to have her continue lisinopril rather than switching to amlodipine. Pt is aware.

## 2023-01-03 ENCOUNTER — Other Ambulatory Visit: Payer: Self-pay | Admitting: Family

## 2023-01-03 ENCOUNTER — Encounter: Payer: Self-pay | Admitting: Family

## 2023-01-28 ENCOUNTER — Encounter: Payer: Self-pay | Admitting: Family

## 2023-01-28 DIAGNOSIS — Z8701 Personal history of pneumonia (recurrent): Secondary | ICD-10-CM

## 2023-01-28 DIAGNOSIS — Z720 Tobacco use: Secondary | ICD-10-CM

## 2023-02-01 ENCOUNTER — Ambulatory Visit (HOSPITAL_BASED_OUTPATIENT_CLINIC_OR_DEPARTMENT_OTHER)
Admission: RE | Admit: 2023-02-01 | Discharge: 2023-02-01 | Disposition: A | Discharge status: Home / Self Care | Payer: No Typology Code available for payment source | Source: Ambulatory Visit | Attending: Family | Admitting: Family

## 2023-02-01 DIAGNOSIS — Z8701 Personal history of pneumonia (recurrent): Secondary | ICD-10-CM | POA: Insufficient documentation

## 2023-02-01 NOTE — Addendum Note (Signed)
Addended by: Wilford Corner on: 02/01/2023 10:43 AM   Modules accepted: Orders

## 2023-02-05 ENCOUNTER — Encounter: Payer: Self-pay | Admitting: Family

## 2023-02-11 ENCOUNTER — Ambulatory Visit (HOSPITAL_BASED_OUTPATIENT_CLINIC_OR_DEPARTMENT_OTHER)
Admission: RE | Admit: 2023-02-11 | Discharge: 2023-02-11 | Disposition: A | Discharge status: Home / Self Care | Payer: No Typology Code available for payment source | Source: Ambulatory Visit | Attending: Family | Admitting: Family

## 2023-02-11 DIAGNOSIS — Z720 Tobacco use: Secondary | ICD-10-CM | POA: Diagnosis present

## 2023-03-21 ENCOUNTER — Other Ambulatory Visit: Payer: Self-pay | Admitting: Family

## 2023-03-21 DIAGNOSIS — R059 Cough, unspecified: Secondary | ICD-10-CM

## 2023-03-30 ENCOUNTER — Other Ambulatory Visit: Payer: Self-pay | Admitting: Family

## 2023-04-30 ENCOUNTER — Ambulatory Visit: Payer: No Typology Code available for payment source | Admitting: Family

## 2023-04-30 VITALS — BP 134/67 | HR 63 | Temp 98.1°F | Resp 16 | Ht 70.0 in | Wt 184.0 lb

## 2023-04-30 DIAGNOSIS — K219 Gastro-esophageal reflux disease without esophagitis: Secondary | ICD-10-CM

## 2023-04-30 DIAGNOSIS — Z13220 Encounter for screening for lipoid disorders: Secondary | ICD-10-CM

## 2023-04-30 DIAGNOSIS — J309 Allergic rhinitis, unspecified: Secondary | ICD-10-CM | POA: Insufficient documentation

## 2023-04-30 DIAGNOSIS — M8580 Other specified disorders of bone density and structure, unspecified site: Secondary | ICD-10-CM

## 2023-04-30 DIAGNOSIS — I10 Essential (primary) hypertension: Secondary | ICD-10-CM

## 2023-04-30 DIAGNOSIS — Z0000 Encounter for general adult medical examination without abnormal findings: Secondary | ICD-10-CM

## 2023-04-30 MED ORDER — LISINOPRIL 5 MG PO TABS: 5.0000 mg | ORAL_TABLET | Freq: Every day | ORAL | 1 refills | Status: DC

## 2023-04-30 NOTE — Assessment & Plan Note (Signed)
 Stable with flonase year round, and zyrtec.

## 2023-04-30 NOTE — Assessment & Plan Note (Signed)
 Bone density WNL, continues calcium supplement.

## 2023-04-30 NOTE — Assessment & Plan Note (Signed)
 Stable, continue hydrochlorothiazide and lisinopril.

## 2023-04-30 NOTE — Assessment & Plan Note (Signed)
 Pt requesting lipid panel. She understands that this may not be covered by insurance.

## 2023-04-30 NOTE — Progress Notes (Signed)
 Subjective:     Patient ID: Lauren Walters, female    DOB: December 04, 1960, 63 y.o.   MRN: [id number]  No chief complaint on file.   HPI  Discussed the use of AI scribe software for clinical note transcription with the patient, who gave verbal consent to proceed.  History of Present Illness  Lauren Walters is a 63 year old female who presents for a follow-up visit.  Hypertension is well-controlled with hydrochlorothiazide 25 mg and lisinopril 5 mg. She encountered an issue with her lisinopril refill, receiving only a 30-day supply instead of the usual 90-day supply. She prefers not to use mail order for her medications.  She has a history of osteopenia, but her most recent bone density test was normal. 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 She takes a calcium supplement and reports no symptoms of height loss. Her mother, who is 51 years old, has experienced significant height loss.  Aortic calcification was noted during a CT scan in the fall. She previously had an exercise stress echo and saw a cardiologist who determined she did not need further follow-up at that time. No symptoms of shortness of breath or murmur are present.  She experiences year-round allergic rhinitis symptoms, primarily managed with Flonase. Increased symptoms occur during cold weather and high pollen seasons, leading to fatigue and sore throat, which are relieved by Zyrtec.  She has a history of gastroesophageal reflux disease but has not refilled her medication in a long time. She occasionally takes it on vacation. No recent symptoms have been experienced.  She had COVID-19 a couple of months ago but reports that it was mild, and her children have not contracted it despite exposure.  Her family history includes her mother, who has aortic stenosis and heart failure, and her father, who had dementia and passed away after a brief  illness. Her mother was recently hospitalized for a GI bleed and pulmonary edema.  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 She is considering future work plans, including possibly working part-time or from home.     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    HTN (hypertension) 03/05/2015   Inclusion cyst 02/17/2018   Left knee pain 09/24/2016   Neck pain 01/30/2015   Osteopenia    Physical exam 12/18/2013   Postmenopausal HRT (hormone replacement therapy) 07/17/2013   Preventative health care 01/30/2015   Skin tag 04/24/2011   Tinnitus of left ear 09/27/2017   Last Assessment & Plan:  Formatting of this note might be different from the original. Concern over ringing at the left ear. 39-month history.  No associated vertigo.  Some associated ear fullness.  No history of ear trauma surgery or infection.  No major history of noise exposure. EXAM shows normal external canals and tympanic membranes. PLAN: We  discussed probably there is some inflammation.  We    Varicose veins 04/24/2011    Past Surgical History:  Procedure Laterality Date   CESAREAN SECTION  1995   CESAREAN SECTION  1997   CHOLECYSTECTOMY  1991   COLONOSCOPY  03/2015   Dr.Nandigam   JOINT REPLACEMENT     R hip   OVARIAN CYST REMOVAL  1990   ROOT CANAL     TOTAL HIP ARTHROPLASTY  2011   TOTAL HIP ARTHROPLASTY Left 2024   WISDOM TOOTH EXTRACTION      Family History  Problem  Relation Age of Onset   Dementia Mother    Hypertension Mother    CVA Mother    Aortic stenosis Mother    Alcohol abuse Father        deceased   Cancer Father        prostate, basal cell    Arthritis Father    Heart disease Father    Lymphoma Brother    Heart disease Maternal Grandfather    Arthritis Paternal Grandfather    Heart disease Paternal Grandfather    Colon cancer Neg Hx    Colon polyps Neg Hx    Esophageal cancer Neg Hx    Stomach cancer Neg Hx    Rectal cancer Neg Hx     Social History   Socioeconomic History   Marital status: Married    Spouse name: Not on file   Number of children: 3   Years of education: Not on file   Highest education level: Not on file  Occupational History   Occupation: Medical Coder    Comment: [employer]  Tobacco Use   Smoking status: Former    Current packs/day: 0.00    Average packs/day: 0.8 packs/day for 30.0 years (22.5 ttl pk-yrs)    Types: Cigarettes    Start date: 05/16/1985    Quit date: 05/17/2015    Years since quitting: 7.9   Smokeless tobacco: Never  Vaping Use   Vaping status: Never Used  Substance and Sexual Activity   Alcohol use: Yes    Alcohol/week: 14.0 standard drinks of alcohol    Types: 14 Glasses of wine per week    Comment: 2 glasses of wine a night   Drug use: No   Sexual activity: Yes    Partners: Male    Birth control/protection: None  Other Topics Concern   Not on file  Social History Narrative   77- daughter   83-son   25- daughter      Youngest at home   Married   Biking/tennis/swimming, reading   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)    Social Drivers of Health   Financial Resource Strain: Not on file  Food Insecurity: Low Risk  (07/12/2022)   [facility]   Hunger Vital Sign    Worried About Running Out of Food in the Last Year: Never true    Ran Out of Food in the Last Year: Never true  Transportation Needs: Not on file (07/12/2022)  Physical  Activity: Not on file  Stress: Not on file  Social Connections: Not on file  Intimate Partner Violence: Not on file    Outpatient Medications Prior to Visit  Medication Sig Dispense Refill   betamethasone valerate ointment (VALISONE) 0.1 % Apply 1 Application topically 2 (two) times daily. 30 g 0   Calcium Carbonate-Vitamin D 600-400 MG-UNIT chew tablet Chew 1 tablet by mouth 2 (two) times daily.  cetirizine (ZYRTEC) 10 MG tablet Take 1 tablet (10 mg total) by mouth daily. 30 tablet 11   estradiol (ESTRACE) 0.1 MG/GM vaginal cream Place 0.5g nightly for two weeks then twice a week after 30 g 11   fluticasone (FLONASE) 50 MCG/ACT nasal spray Place into the nose.     hydrochlorothiazide (HYDRODIURIL) 25 MG tablet TAKE 1 TABLET BY MOUTH DAILY 90 tablet 1   lisinopril (ZESTRIL) 5 MG tablet TAKE 1 TABLET BY MOUTH DAILY 90 tablet 1   omeprazole (PRILOSEC) 40 MG capsule Take 40 mg by mouth daily as needed.     cefpodoxime (VANTIN) 200 MG tablet Take 1 tablet (200 mg total) by mouth 2 (two) times daily. 14 tablet 0   guaiFENesin-codeine (ROBITUSSIN AC) 100-10 MG/5ML syrup Take 5 mLs by mouth 3 (three) times daily as needed for cough. 75 mL 0   No facility-administered medications prior to visit.    Allergies  Allergen Reactions   Metoprolol Tartrate Palpitations and Other (See Comments)    Chest pain, increased palpitations    ROS    See HPI Objective:    Physical Exam Constitutional:      General: She is not in acute distress.    Appearance: Normal appearance. She is well-developed.  HENT:     Head: Normocephalic and atraumatic.     Right Ear: External ear normal.     Left Ear: External ear normal.  Eyes:     General: No scleral icterus. Neck:     Thyroid: No thyromegaly.  Cardiovascular:     Rate and Rhythm: Normal rate and regular rhythm.     Heart sounds: Normal heart sounds. No murmur heard. Pulmonary:     Effort: Pulmonary effort is normal. No respiratory distress.      Breath sounds: Normal breath sounds. No wheezing.  Musculoskeletal:     Cervical back: Neck supple.  Skin:    General: Skin is warm and dry.  Neurological:     Mental Status: She is alert and oriented to person, place, and time.  Psychiatric:        Mood and Affect: Mood normal.        Behavior: Behavior normal.        Thought Content: Thought content normal.        Judgment: Judgment normal.      BP 134/67 (BP Location: Right Arm, Patient Position: Sitting, Cuff Size: Small)   Pulse 63   Temp 98.1 F (36.7 C) (Oral)   Resp 16   Ht 5\' 10"  (1.778 m)   Wt 184 lb (83.5 kg)   SpO2 100%   BMI 26.40 kg/m  Wt Readings from Last 3 Encounters:  04/30/23 184 lb (83.5 kg)  12/25/22 186 lb (84.4 kg)  10/24/22 182 lb (82.6 kg)       Assessment & Plan:   Problem List Items Addressed This Visit       Unprioritized   Preventative health care   Pt requesting lipid panel. She understands that this may not be covered by insurance.      Relevant Orders   Lipid panel   Osteopenia   Bone density WNL, continues calcium supplement.       HTN (hypertension) - Primary   Stable, continue hydrochlorothiazide and lisinopril.       Relevant Medications   lisinopril (ZESTRIL) 5 MG tablet   Other Relevant Orders   Comp Met (CMET)   GERD (gastroesophageal reflux disease)   No longer using omeprazole.  Allergic rhinitis   Stable with flonase year round, and zyrtec.       I have discontinued Sydna A. Cubit's omeprazole, guaiFENesin-codeine, and cefpodoxime. I have also changed her lisinopril. Additionally, I am having her maintain her cetirizine, Calcium Carbonate-Vitamin D, estradiol, betamethasone valerate ointment, hydrochlorothiazide, and fluticasone.  Meds ordered this encounter  Medications   lisinopril (ZESTRIL) 5 MG tablet    Sig: Take 1 tablet (5 mg total) by mouth daily.    Dispense:  90 tablet    Refill:  1    Supervising Provider:   Danise Edge A  [[id number]]

## 2023-04-30 NOTE — Assessment & Plan Note (Signed)
 No longer using omeprazole.

## 2023-04-30 NOTE — Patient Instructions (Signed)
 VISIT SUMMARY:  Lauren Walters, during your follow-up visit, we reviewed your current health status and discussed several ongoing health issues. Your blood pressure is well-controlled, and we addressed a medication refill issue. We also reviewed your history of osteopenia, aortic calcification, allergic rhinitis, and gastroesophageal reflux disease (GERD). Additionally, we discussed your general health maintenance, including a plan for routine cholesterol testing.  YOUR PLAN:  -HYPERTENSION: Hypertension, or high blood pressure, is being well-managed with your current medications. We will continue your hydrochlorothiazide 25 mg daily and lisinopril 5 mg daily, and ensure you receive a 90-day supply of lisinopril.  -GASTROESOPHAGEAL REFLUX DISEASE (GERD): GERD is a condition where stomach acid frequently flows back into the esophagus. Since you prefer to use medication as needed, we will remove the GERD medication from your prescription list and you can use over-the-counter options, taking two 20 mg tablets if necessary.  -ALLERGIC RHINITIS: Allergic rhinitis is an allergic reaction that causes sneezing, congestion, and a runny nose. You should continue using Flonase daily and take Zyrtec as needed for symptom relief.  -AORTIC CALCIFICATION: Aortic calcification is the buildup of calcium deposits in the aorta. There are no significant changes and no symptoms requiring follow-up at this time. Please monitor for any new symptoms like a murmur or shortness of breath.  -OSTEOPENIA: Osteopenia is a condition where bone density is lower than normal. Your recent bone density scan was normal, so continue with your calcium supplementation.  -GENERAL HEALTH MAINTENANCE: We will check your cholesterol levels since they were last tested in 2022. Please be aware of your Medicare plan options and costs.  INSTRUCTIONS:  Please ensure you get a 90-day supply of lisinopril. Use over-the-counter medication for GERD as  needed. Continue with your current treatments for allergic rhinitis and bone health. Monitor for any new symptoms related to aortic calcification. Schedule your next visit in six months and get your cholesterol tested.

## 2023-05-01 LAB — COMPREHENSIVE METABOLIC PANEL
ALT: 21 U/L (ref 0–35)
AST: 26 U/L (ref 0–37)
Albumin: 4.7 g/dL (ref 3.5–5.2)
Alkaline Phosphatase: 53 U/L (ref 39–117)
BUN: 13 mg/dL (ref 6–23)
CO2: 32 meq/L (ref 19–32)
Calcium: 10 mg/dL (ref 8.4–10.5)
Chloride: 99 meq/L (ref 96–112)
Creatinine, Ser: 0.63 mg/dL (ref 0.40–1.20)
GFR: 93.58 mL/min (ref >60.00)
Glucose, Bld: 93 mg/dL (ref 70–99)
Potassium: 4 meq/L (ref 3.5–5.1)
Sodium: 139 meq/L (ref 135–145)
Total Bilirubin: 0.4 mg/dL (ref 0.2–1.2)
Total Protein: 6.9 g/dL (ref 6.0–8.3)

## 2023-05-01 LAB — LIPID PANEL
Cholesterol: 222 mg/dL — ABNORMAL HIGH (ref 0–200)
HDL: 106.7 mg/dL (ref >39.00)
LDL Cholesterol: 96 mg/dL (ref 0–99)
NonHDL: 115.64
Total CHOL/HDL Ratio: 2
Triglycerides: 96 mg/dL (ref 0.0–149.0)
VLDL: 19.2 mg/dL (ref 0.0–40.0)

## 2023-05-02 ENCOUNTER — Encounter: Payer: Self-pay | Admitting: Family

## 2023-05-10 ENCOUNTER — Other Ambulatory Visit: Payer: Self-pay | Admitting: Family

## 2023-07-01 ENCOUNTER — Encounter: Payer: Self-pay | Admitting: Family

## 2023-07-01 ENCOUNTER — Other Ambulatory Visit: Payer: Self-pay

## 2023-07-01 DIAGNOSIS — N952 Postmenopausal atrophic vaginitis: Secondary | ICD-10-CM

## 2023-07-01 MED ORDER — ESTRADIOL 0.1 MG/GM VA CREA: TOPICAL_CREAM | VAGINAL | 11 refills | Status: AC

## 2023-07-01 MED ORDER — HYDROCHLOROTHIAZIDE 25 MG PO TABS: 25.0000 mg | ORAL_TABLET | Freq: Every day | ORAL | 1 refills | Status: DC

## 2023-07-25 ENCOUNTER — Encounter: Payer: Self-pay | Admitting: Family

## 2023-07-26 MED ORDER — AMLODIPINE BESYLATE 2.5 MG PO TABS: 2.5000 mg | ORAL_TABLET | Freq: Every day | ORAL | 0 refills | Status: DC

## 2023-10-08 IMAGING — MG MM DIGITAL SCREENING BILAT W/ TOMO AND CAD
6 of 10 series · 6 of 30 positions shown · non-contrast
Comparison: Previous exam(s).

CLINICAL DATA: Screening.

EXAM:
DIGITAL SCREENING BILATERAL MAMMOGRAM WITH TOMOSYNTHESIS AND CAD
TECHNIQUE: Bilateral screening digital craniocaudal and mediolateral oblique
mammograms were obtained. Bilateral screening digital breast
tomosynthesis was performed. The images were evaluated with
computer-aided detection.

[L MLO synth-2D (1 of 2)]
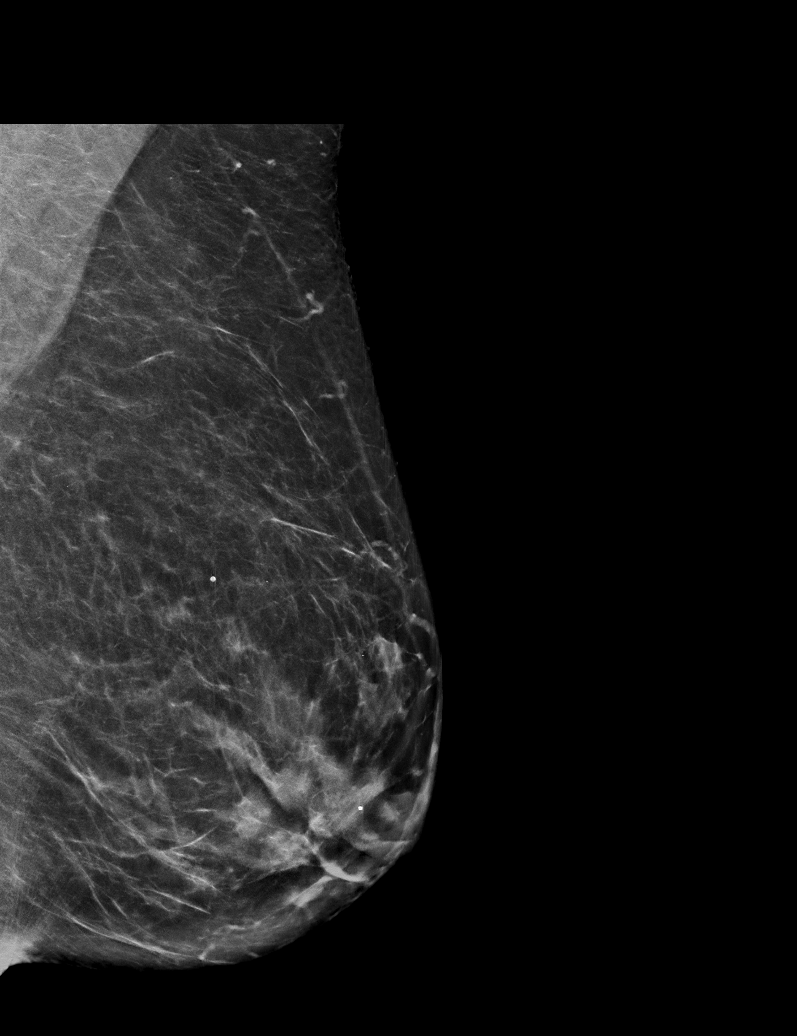

[R CC synth-2D]
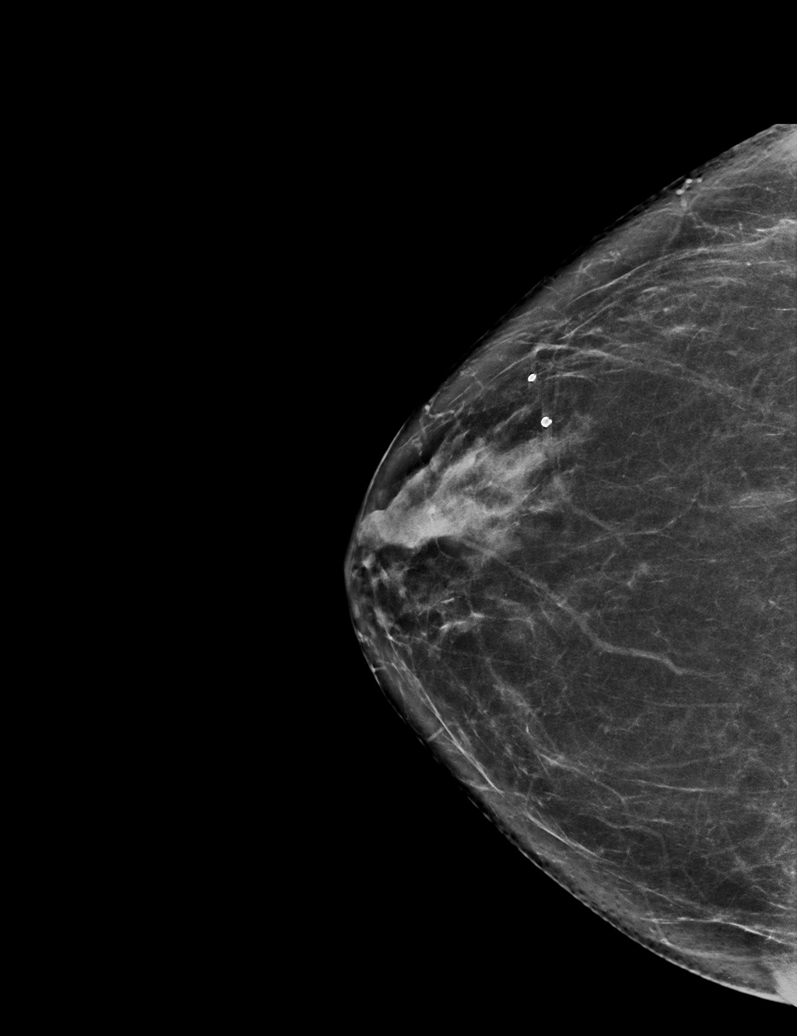

[L CC synth-2D]
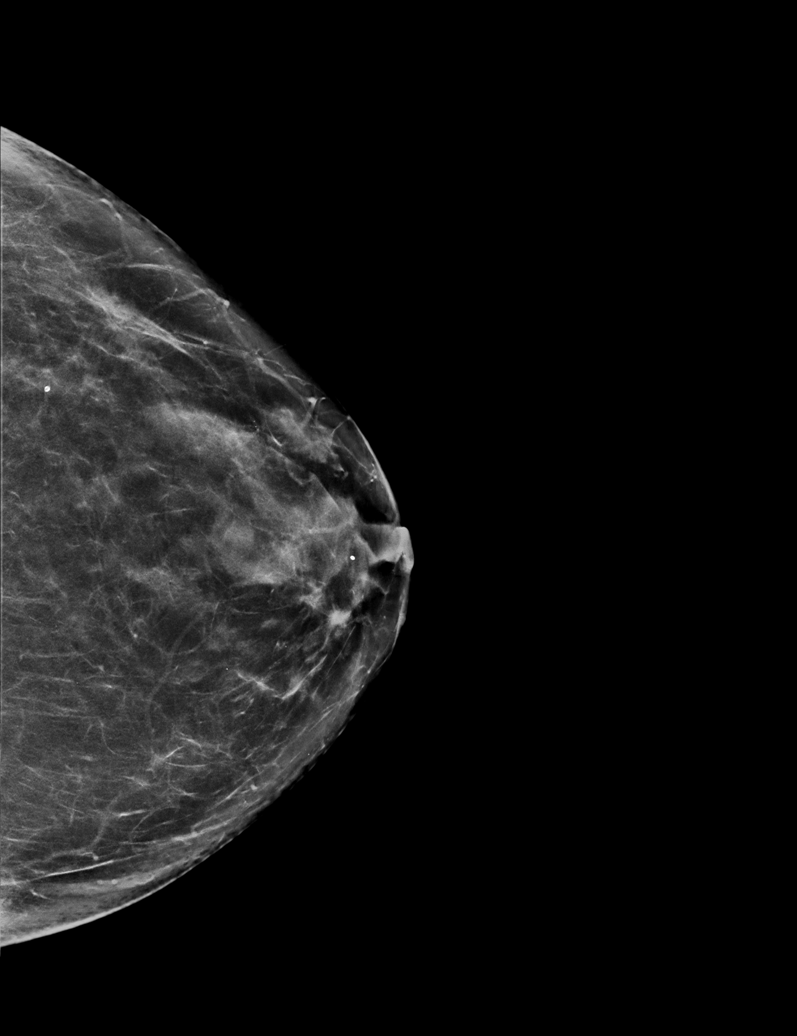

[R MLO synth-2D]
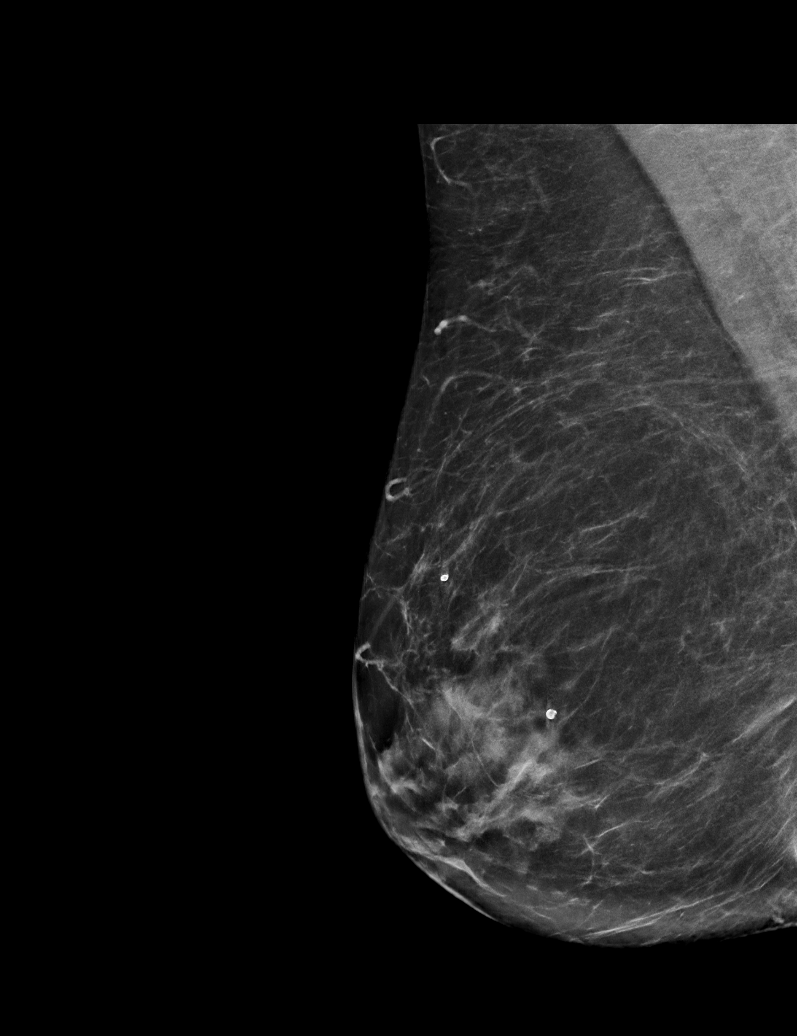

[L MLO synth-2D (2 of 2)]
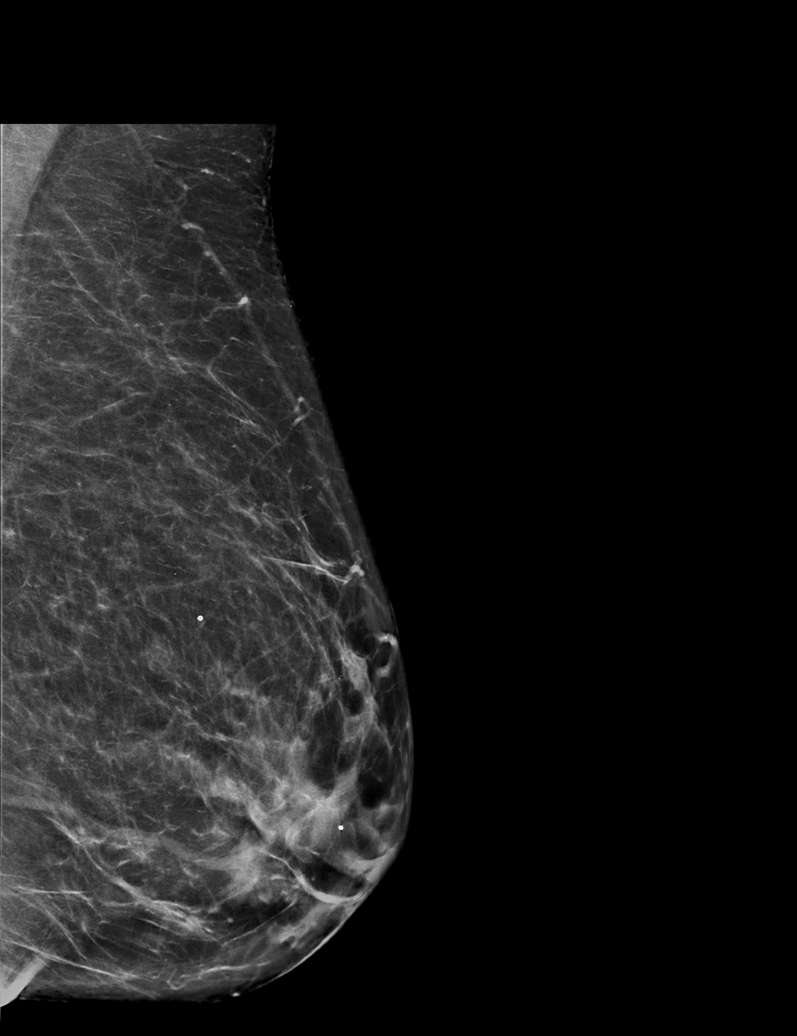

[R CC tomo · tomo slice 34/67.0]
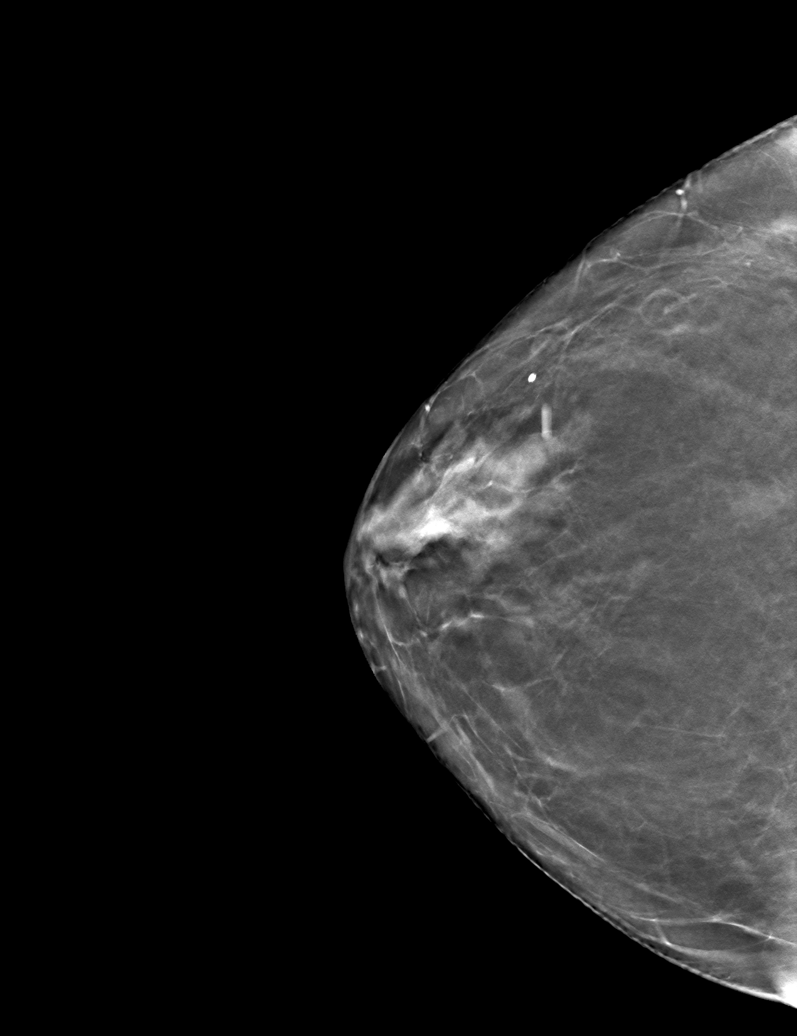

[6 of 30 positions shown; findings below may reference images not displayed]

ACR Breast Density Category c: The breast tissue is heterogeneously
dense, which may obscure small masses.
FINDINGS: There are no findings suspicious for malignancy.
IMPRESSION: No mammographic evidence of malignancy. A result letter of this
screening mammogram will be mailed directly to the patient.

RECOMMENDATION:
Screening mammogram in one year. (Code:[id number]-W-BC3)

BI-RADS CATEGORY  1: Negative.

## 2023-10-21 ENCOUNTER — Other Ambulatory Visit: Payer: Self-pay | Admitting: Family

## 2023-10-25 ENCOUNTER — Ambulatory Visit (INDEPENDENT_AMBULATORY_CARE_PROVIDER_SITE_OTHER): Admitting: Family

## 2023-10-25 ENCOUNTER — Encounter: Payer: Self-pay | Admitting: Family

## 2023-10-25 VITALS — BP 135/63 | HR 85 | Temp 98.5°F | Resp 16 | Ht 70.0 in | Wt 172.0 lb

## 2023-10-25 DIAGNOSIS — Z1231 Encounter for screening mammogram for malignant neoplasm of breast: Secondary | ICD-10-CM

## 2023-10-25 DIAGNOSIS — Z23 Encounter for immunization: Secondary | ICD-10-CM | POA: Diagnosis not present

## 2023-10-25 DIAGNOSIS — Z0000 Encounter for general adult medical examination without abnormal findings: Secondary | ICD-10-CM | POA: Diagnosis not present

## 2023-10-25 DIAGNOSIS — I10 Essential (primary) hypertension: Secondary | ICD-10-CM | POA: Diagnosis not present

## 2023-10-25 DIAGNOSIS — M5417 Radiculopathy, lumbosacral region: Secondary | ICD-10-CM

## 2023-10-25 LAB — COMPREHENSIVE METABOLIC PANEL WITH GFR
AG Ratio: 2.2 (calc) (ref 1.0–2.5)
ALT: 19 U/L (ref 6–29)
AST: 27 U/L (ref 10–35)
Albumin: 4.6 g/dL (ref 3.6–5.1)
Alkaline phosphatase (APISO): 46 U/L (ref 37–153)
BUN: 11 mg/dL (ref 7–25)
CO2: 33 mmol/L — ABNORMAL HIGH (ref 20–32)
Calcium: 10.2 mg/dL (ref 8.6–10.4)
Chloride: 99 mmol/L (ref 98–110)
Creat: 0.56 mg/dL (ref 0.50–1.05)
Globulin: 2.1 g/dL (ref 1.9–3.7)
Glucose, Bld: 119 mg/dL — ABNORMAL HIGH (ref 65–99)
Potassium: 3.6 mmol/L (ref 3.5–5.3)
Sodium: 140 mmol/L (ref 135–146)
Total Bilirubin: 0.6 mg/dL (ref 0.2–1.2)
Total Protein: 6.7 g/dL (ref 6.1–8.1)
eGFR: 101 mL/min/1.73m2 (ref >=60)

## 2023-10-25 LAB — LIPID PANEL
Cholesterol: 212 mg/dL — ABNORMAL HIGH (ref <200)
HDL: 93 mg/dL (ref >=50)
LDL Cholesterol (Calc): 100 mg/dL — ABNORMAL HIGH
Non-HDL Cholesterol (Calc): 119 mg/dL (ref <130)
Total CHOL/HDL Ratio: 2.3 (calc) (ref <5.0)
Triglycerides: 95 mg/dL (ref <150)

## 2023-10-25 MED ORDER — MELOXICAM 7.5 MG PO TABS
7.5000 mg | ORAL_TABLET | Freq: Every day | ORAL | 0 refills | Status: DC
Start: 2023-10-25 — End: 2023-11-08

## 2023-10-25 NOTE — Assessment & Plan Note (Signed)
 Continue healthy diet and exercise. Prevnar 20 today. Recommend covid and flu shots this fall at her pharmacy.  Pap/colo up to date. She will schedule mammogram for October.

## 2023-10-25 NOTE — Patient Instructions (Signed)
 VISIT SUMMARY:  You had a Welcome to Medicare visit and annual physical exam. We discussed your tingling sensation in your left foot and leg, your overall wellness, and your current health conditions including hypertension and weight management.  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 -Take meloxicam  once daily for two weeks. -If symptoms persist, we may refer you to a neurosurgeon. -We discussed the potential for physical therapy if needed.  ADULT WELLNESS VISIT: This was a routine wellness visit. You are engaging in regular physical activity and maintaining a healthy diet. -Administered pneumonia vaccine. -Ordered a mammogram. -Encouraged you to get a flu shot and COVID booster.  HYPERTENSION: Your hypertension is well-controlled with your current medication. -Continue your current medication regimen.  OBESITY: You are gradually losing weight with regular exercise and healthy eating. -Continue your current exercise and diet routine.

## 2023-10-25 NOTE — Progress Notes (Signed)
 Subjective:     Patient ID: Lauren Walters, female    DOB: [birth year], 63 y.o.   MRN: [id number]  Chief Complaint  Patient presents with   Annual Exam    HPI  Discussed the use of AI scribe software for clinical note transcription with the patient, who gave verbal consent to proceed.  History of Present Illness   Lauren Walters is a 63 year old female who presents for a Welcome to Medicare visit and annual physical exam.  She experiences tingling in her left foot, occasionally extending up into her left lateral leg, especially when sitting. The sensation is primarily in the left big toe. She has a history of spinal arthritis and disc bulges. The tingling has increased in frequency recently.  She maintains a healthy lifestyle, regularly walking four miles at a pace of 17 to 17.5 minutes per mile. She has been losing weight gradually, about a pound a month, since her hip surgery.  She has no current cough, cold symptoms, skin concerns, hearing or vision issues beyond needing glasses, digestive concerns, constipation, diarrhea, urinary issues, unusual muscle or joint pain, frequent headaches, or concerns about depression or anxiety.  Immunizations: due for prevnar Diet: healthy Exercise:  walks regular Colonoscopy: 2023 good for 10 years Dexa: up to date, normal Pap Smear: 2024 up to date Mammogram: due 10/7 Vision: up to date Dental: up to date     Health Maintenance Due  Topic Date Due   Medicare Annual Wellness (AWV)  Never done   COVID-19 Vaccine (6 - Pfizer risk 2024-25 season) 06/03/2023   INFLUENZA VACCINE  09/27/2023    Past Medical History:  Diagnosis Date   Adhesive capsulitis of right shoulder 09/24/2016   Arthritis    Atypical chest pain 11/11/2015   DEGENERATIVE JOINT DISEASE, RIGHT HIP 11/11/2008   Qualifier: Diagnosis of  By: HARVEY MD, KARL     GERD (gastroesophageal reflux disease)    History of chicken pox    HTLVTYPE I CCE & UNS SITE  07/08/2007   Qualifier: Diagnosis of  By: Amon MD, Jose E.    HTN (hypertension) 03/05/2015   Inclusion cyst 02/17/2018   Left knee pain 09/24/2016   Neck pain 01/30/2015   Osteopenia    Physical exam 12/18/2013   Postmenopausal HRT (hormone replacement therapy) 07/17/2013   Preventative health care 01/30/2015   Skin tag 04/24/2011   Tinnitus of left ear 09/27/2017   Last Assessment & Plan:  Formatting of this note might be different from the original. Concern over ringing at the left ear. 48-month history.  No associated vertigo.  Some associated ear fullness.  No history of ear trauma surgery or infection.  No major history of noise exposure. EXAM shows normal external canals and tympanic membranes. PLAN: We discussed probably there is some inflammation.  We    Varicose veins 04/24/2011    Past Surgical History:  Procedure Laterality Date   CESAREAN SECTION  1995   CESAREAN SECTION  1997   CHOLECYSTECTOMY  1991   COLONOSCOPY  03/2015   Dr.Nandigam   JOINT REPLACEMENT     R hip   OVARIAN CYST REMOVAL  1990   ROOT CANAL     TOTAL HIP ARTHROPLASTY  2011   TOTAL HIP ARTHROPLASTY Left 2024   WISDOM TOOTH EXTRACTION      Family History  Problem Relation Age of Onset   Dementia Mother    Hypertension Mother    CVA Mother  died from stroke   Aortic stenosis Mother    Alcohol abuse Father        deceased   Cancer Father        prostate, basal cell    Arthritis Father    Heart disease Father    Lymphoma Brother    Heart disease Maternal Grandfather    Arthritis Paternal Grandfather    Heart disease Paternal Grandfather    Colon cancer Neg Hx    Colon polyps Neg Hx    Esophageal cancer Neg Hx    Stomach cancer Neg Hx    Rectal cancer Neg Hx     Social History   Socioeconomic History   Marital status: Married    Spouse name: Not on file   Number of children: 3   Years of education: Not on file   Highest education level: Not on file  Occupational History    Occupation: Medical Coder    Comment: [employer]  Tobacco Use   Smoking status: Former    Current packs/day: 0.00    Average packs/day: 0.8 packs/day for 30.0 years (22.5 ttl pk-yrs)    Types: Cigarettes    Start date: 05/16/1985    Quit date: 05/17/2015    Years since quitting: 8.4   Smokeless tobacco: Never  Vaping Use   Vaping status: Never Used  Substance and Sexual Activity   Alcohol use: Yes    Alcohol/week: 14.0 standard drinks of alcohol    Types: 14 Glasses of wine per week    Comment: 2 glasses of wine a night   Drug use: No   Sexual activity: Yes    Partners: Male    Birth control/protection: None  Other Topics Concern   Not on file  Social History Narrative   16- daughter   35-son   64- daughter      Youngest at home   Married   Biking/tennis/swimming, reading   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 : Never true  Transportation Needs: Not on file (07/12/2022)  Physical Activity: Not on file  Stress: Not on file  Social Connections: Not on file  Intimate Partner Violence: Not on file    Outpatient Medications Prior to Visit  Medication Sig Dispense Refill   amLODipine  (NORVASC ) 2.5 MG tablet TAKE 1 TABLET BY MOUTH DAILY 90 tablet 0   amoxicillin  (AMOXIL ) 500 MG capsule TAKE 4 CAPSULES BY MOUTH ONCE FOR 1 DOSE 60 MINUTES PRIOR TO DENTAL PROCEDURE 4 capsule 0   betamethasone  valerate ointment (VALISONE ) 0.1 % Apply 1 Application topically 2 (two) times daily. 30 g 0   Calcium  Carbonate-Vitamin D  600-400 MG-UNIT chew tablet Chew 1 tablet by mouth 2 (two) times daily.      cetirizine  (ZYRTEC ) 10 MG tablet Take 1 tablet (10 mg total) by mouth daily. 30 tablet 11   estradiol  (ESTRACE ) 0.1 MG/GM vaginal cream Place 0.5g nightly for two weeks then twice a week after 30 g 11   fluticasone  (FLONASE ) 50 MCG/ACT nasal spray Place into the nose.  hydrochlorothiazide  (HYDRODIURIL ) 25 MG tablet Take 1 tablet (25 mg total) by mouth daily. 90 tablet 1   No facility-administered medications prior to visit.    Allergies  Allergen Reactions   Metoprolol  Tartrate Palpitations and Other (See Comments)    Chest pain, increased palpitations    Review of Systems  Constitutional:  Positive for weight loss.  HENT:  Negative for congestion and hearing loss.   Eyes:  Negative for blurred vision.  Respiratory:  Negative for cough.   Cardiovascular:  Negative for leg swelling.  Gastrointestinal:  Negative for constipation and diarrhea.  Genitourinary:  Negative for dysuria and frequency.  Musculoskeletal:  Negative for joint pain and myalgias.  Skin:  Negative for rash.  Neurological:  Negative for headaches.  Psychiatric/Behavioral:         Denies depression/anxiety       Objective:    Physical Exam   BP 135/63 (BP Location: Right Arm, Patient Position: Sitting, Cuff Size: Normal)   Pulse 85   Temp 98.5 F (36.9 C) (Oral)   Resp 16   Ht 5' 10 (1.778 m)   Wt 172 lb (78 kg)   SpO2 100%   BMI 24.68 kg/m  Wt Readings from Last 3 Encounters:  10/25/23 172 lb (78 kg)  04/30/23 184 lb (83.5 kg)  12/25/22 186 lb (84.4 kg)   Physical Exam  Constitutional: She is oriented to person, place, and time. She appears well-developed and well-nourished. No distress.  HENT:  Head: Normocephalic and atraumatic.  Right Ear: Tympanic membrane and ear canal normal.  Left Ear: Tympanic membrane and ear canal normal.  Mouth/Throat: Oropharynx is clear and moist.  Eyes: Pupils are equal, round, and reactive to light. No scleral icterus.  Neck: Normal range of motion. No  thyromegaly present.  Cardiovascular: Normal rate and regular rhythm.   No murmur heard. Pulmonary/Chest: Effort normal and breath sounds normal. No respiratory distress. He has no wheezes. She has no rales. She exhibits no tenderness.  Abdominal: Soft. Bowel sounds are normal. She exhibits no distension and no mass. There is no tenderness. There is no rebound and no guarding.  Musculoskeletal: She exhibits no edema.  Lymphadenopathy:    She has no cervical adenopathy.  Neurological: She is alert and oriented to person, place, and time. She has normal patellar reflexes. She exhibits normal muscle tone. Coordination normal.  Skin: Skin is warm and dry.  Psychiatric: She has a normal mood and affect. Her behavior is normal. Judgment and thought content normal.  Breast/pelvic: deferred            Assessment & Plan:       Assessment & Plan:   Problem List Items Addressed This Visit       Unprioritized   Preventative health care   Continue healthy diet and exercise. Prevnar 20 today. Recommend covid and flu shots this fall at her pharmacy.  Pap/colo up to date. She will schedule mammogram for October.       Lumbosacral radiculopathy at L5   Numbness and tingling in the left lower leg and left great toe. Recommend trial of meloxicam .       Relevant Medications   meloxicam  (MOBIC ) 7.5 MG tablet   HTN (hypertension)   Bp stable on amlodipine  and hydrochlorothiazide , continue same.       Relevant Orders   Comp Met (CMET)   Lipid panel   Other Visit Diagnoses       Breast cancer screening by mammogram    -  Primary   Relevant Orders   MM 3D SCREENING MAMMOGRAM BILATERAL BREAST     Need for pneumococcal 20-valent conjugate vaccination       Relevant Orders   Pneumococcal conjugate vaccine 20-valent (Completed)       I am having Joselyne A. Padovano start on meloxicam . I am also having her maintain her cetirizine , Calcium  Carbonate-Vitamin D , betamethasone  valerate  ointment, fluticasone , amoxicillin , estradiol , hydrochlorothiazide , and amLODipine .  Meds ordered this encounter  Medications   meloxicam  (MOBIC ) 7.5 MG tablet    Sig: Take 1 tablet (7.5 mg total) by mouth daily.    Dispense:  14 tablet    Refill:  0    Supervising Provider:   DOMENICA BLACKBIRD A [[id number]]

## 2023-10-25 NOTE — Assessment & Plan Note (Signed)
 Numbness and tingling in the left lower leg and left great toe. Recommend trial of meloxicam .

## 2023-10-25 NOTE — Assessment & Plan Note (Signed)
 Bp stable on amlodipine  and hydrochlorothiazide , continue same.

## 2023-10-26 ENCOUNTER — Ambulatory Visit: Payer: Self-pay | Admitting: Family

## 2023-10-30 ENCOUNTER — Ambulatory Visit: Admitting: Family

## 2023-10-30 ENCOUNTER — Encounter: Payer: Self-pay | Admitting: Family

## 2023-10-30 VITALS — BP 139/74 | HR 64 | Temp 97.9°F | Resp 16 | Ht 70.0 in | Wt 171.0 lb

## 2023-10-30 DIAGNOSIS — Z0000 Encounter for general adult medical examination without abnormal findings: Secondary | ICD-10-CM | POA: Diagnosis not present

## 2023-10-31 NOTE — Progress Notes (Addendum)
 Subjective:    Lauren Walters is a 63 y.o. female who presents for a Welcome to Medicare exam.   Cardiac Risk Factors include: advanced age (>42men, >52 women);family history of premature cardiovascular disease;hypertension      Objective:    Today's Vitals   10/30/23 1035 10/30/23 1105  BP: (!) 147/66 139/74  Pulse: 64   Resp: 16   Temp: 97.9 F (36.6 C)   TempSrc: Oral   SpO2: 100%   Weight: 171 lb (77.6 kg)   Height: 5' 10 (1.778 m)   Body mass index is 24.54 kg/m.  Medications Outpatient Encounter Medications as of 10/30/2023  Medication Sig   amLODipine  (NORVASC ) 2.5 MG tablet TAKE 1 TABLET BY MOUTH DAILY   amoxicillin  (AMOXIL ) 500 MG capsule TAKE 4 CAPSULES BY MOUTH ONCE FOR 1 DOSE 60 MINUTES PRIOR TO DENTAL PROCEDURE   betamethasone  valerate ointment (VALISONE ) 0.1 % Apply 1 Application topically 2 (two) times daily.   Calcium  Carbonate-Vitamin D  600-400 MG-UNIT chew tablet Chew 1 tablet by mouth 2 (two) times daily.   cetirizine  (ZYRTEC ) 10 MG tablet Take 1 tablet (10 mg total) by mouth daily.   estradiol  (ESTRACE ) 0.1 MG/GM vaginal cream Place 0.5g nightly for two weeks then twice a week after   fluticasone  (FLONASE ) 50 MCG/ACT nasal spray Place into the nose.   hydrochlorothiazide  (HYDRODIURIL ) 25 MG tablet Take 1 tablet (25 mg total) by mouth daily.   meloxicam  (MOBIC ) 7.5 MG tablet Take 1 tablet (7.5 mg total) by mouth daily.   No facility-administered encounter medications on file as of 10/30/2023.     History: Past Medical History:  Diagnosis Date   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    HTN (hypertension) 03/05/2015   Inclusion cyst  02/17/2018   Left knee pain 09/24/2016   Neck pain 01/30/2015   Osteopenia    Physical exam 12/18/2013   Postmenopausal HRT (hormone replacement therapy) 07/17/2013   Preventative health care 01/30/2015   Skin tag 04/24/2011   Tinnitus of left ear 09/27/2017   Last Assessment & Plan:  Formatting of this note might be different from the original. Concern over ringing at the left ear. 59-month history.  No associated vertigo.  Some associated ear fullness.  No history of ear trauma surgery or infection.  No major history of noise exposure. EXAM shows normal external canals and tympanic membranes. PLAN: We discussed probably there is some inflammation.  We    Varicose veins 04/24/2011   Past Surgical History:  Procedure Laterality Date   CESAREAN SECTION  1995   CESAREAN SECTION  1997   CHOLECYSTECTOMY  1991   COLONOSCOPY  03/2015   Dr.Nandigam   JOINT REPLACEMENT     R hip   OVARIAN CYST REMOVAL  1990   ROOT CANAL     TOTAL HIP ARTHROPLASTY  2011   TOTAL HIP ARTHROPLASTY Left 2024   WISDOM TOOTH EXTRACTION      Family History  Problem Relation Age of Onset   Dementia Mother    Hypertension Mother    CVA Mother        died  from stroke   Aortic stenosis Mother    Alcohol abuse Father        deceased   Cancer Father        prostate, basal cell    Arthritis Father    Heart disease Father    Lymphoma Brother    Heart disease Maternal Grandfather    Arthritis Paternal Grandfather    Heart disease Paternal Grandfather    Colon cancer Neg Hx    Colon polyps Neg Hx    Esophageal cancer Neg Hx    Stomach cancer Neg Hx    Rectal cancer Neg Hx    Social History   Occupational History   Occupation: Energy manager    Comment: [employer]  Tobacco Use   Smoking status: Former    Current packs/day: 0.00    Average packs/day: 0.8 packs/day for 30.0 years (22.5 ttl pk-yrs)    Types: Cigarettes    Start date: 05/16/1985    Quit date: 05/17/2015    Years since quitting: 8.4    Smokeless tobacco: Never  Vaping Use   Vaping status: Never Used  Substance and Sexual Activity   Alcohol use: Yes    Alcohol/week: 14.0 standard drinks of alcohol    Types: 14 Glasses of wine per week    Comment: 2 glasses of wine a night   Drug use: No   Sexual activity: Yes    Partners: Male    Birth control/protection: None    Tobacco Counseling Counseling given: Not Answered   Immunizations and Health Maintenance Immunization History  Administered Date(s) Administered   Influenza, Seasonal, Injecte, Preservative Fre 12/03/2022   Influenza,inj,Quad PF,6+ Mos 12/18/2013, 11/27/2014, 11/11/2015, 10/27/2016, 12/09/2018, 12/10/2019   Influenza,inj,Quad PF,6-35 Mos 12/09/2020   Influenza-Unspecified 11/28/2012, 11/24/2014, 12/09/2021   Moderna SARS-COV2 Booster Vaccination 12/22/2019   PFIZER(Purple Top)SARS-COV-2 Vaccination 03/25/2019, 04/22/2019, 12/09/2020   PNEUMOCOCCAL CONJUGATE-20 10/25/2023   Pfizer Covid Bivalent Pediatric Vaccine(38mos to <58yrs) 12/10/2020   Pfizer(Comirnaty )Fall Seasonal Vaccine 12 years and older 02/11/2022, 12/03/2022   Td 08/12/2018   Tdap 02/26/2009   Zoster Recombinant(Shingrix ) 08/12/2018, 10/16/2018   Zoster, Live 12/18/2013   There are no preventive care reminders to display for this patient.  Activities of Daily Living    10/30/2023   10:40 AM  In your present state of health, do you have any difficulty performing the following activities:  Hearing? 0  Vision? 0  Difficulty concentrating or making decisions? 0  Walking or climbing stairs? 0  Dressing or bathing? 0  Doing errands, shopping? 0  Preparing Food and eating ? N  Using the Toilet? N  In the past six months, have you accidently leaked urine? N  Do you have problems with loss of bowel control? N  Managing your Medications? N  Managing your Finances? N  Housekeeping or managing your Housekeeping? N    Physical Exam   Physical Exam (optional), or other factors deemed  appropriate based on the beneficiary's medical and social history and current clinical standards. Physical Exam  Constitutional: She is oriented to person, place, and time. She appears well-developed and well-nourished. No distress.  HENT:  Head: Normocephalic and atraumatic.   Eyes:.No scleral icterus.  Neck: Normal range of motion. supple Cardiovascular: Normal rate and regular rhythm.   No murmur heard. Pulmonary/Chest: Effort normal and breath sounds normal. No respiratory distress. He has no wheezes. She has no rales. She exhibits no tenderness. Musculoskeletal: She exhibits no edema. Neurological: She is alert and oriented to person, place,  and time. She has normal patellar reflexes. She exhibits normal muscle tone. Coordination normal.  Skin: Skin is warm and dry.  Psychiatric: She has a normal mood and affect. Her behavior is normal. Judgment and thought content normal.              Assessment & Plan:     Advanced Directives: Does Patient Have a Medical Advance Directive?: Yes Type of Advance Directive: Living will Does patient want to make changes to medical advance directive?: No - Patient declined  EKG:  EKG tracing is personally reviewed.  EKG notes NSR.  No acute changes.        Assessment:    This is a routine wellness examination for this patient .   Vision/Hearing screen Hearing Screening   500Hz  1000Hz  2000Hz  4000Hz   Right ear Pass Pass Pass Pass  Left ear Pass Pass Pass Fail   Vision Screening   Right eye Left eye Both eyes  Without correction     With correction 20/25 20/20 20/20      Goals      Weight (lb) < 165 lb (74.8 kg)     Will like to lose some weight         Depression Screen    10/30/2023   10:36 AM 10/25/2023    1:33 PM 10/24/2022    8:09 AM 10/02/2021    7:33 AM  PHQ 2/9 Scores  PHQ - 2 Score 0 0 0 0  PHQ- 9 Score 0 0 0      Fall Risk    10/30/2023   10:36 AM  Fall Risk   Falls in the past year? 0  Number falls in  past yr: 0  Injury with Fall? 0  Risk for fall due to : No Fall Risks  Follow up Falls evaluation completed    Cognitive Function:    10/30/2023   10:50 AM  MMSE - Mini Mental State Exam  Orientation to time 5  Orientation to Place 5  Registration 3  Attention/ Calculation 5  Recall 3  Language- name 2 objects 2  Language- repeat 1  Language- follow 3 step command 3  Language- read & follow direction 1  Write a sentence 1  Copy design 1  Total score 30        Patient Care Team: Daryl Setter, NP as PCP - General (Internal Medicine)     Plan:     I have personally reviewed and noted the following in the patient's chart:   Medical and social history Use of alcohol, tobacco or illicit drugs  Current medications and supplements including opioid prescriptions. Patient is not currently taking opioid prescriptions. Functional ability and status Nutritional status Physical activity Advanced directives List of other physicians Hospitalizations, surgeries, and ER visits in previous 12 months Vitals Screenings to include cognitive, depression, and falls Referrals and appointments  In addition, I have reviewed and discussed with patient certain preventive protocols, quality metrics, and best practice recommendations. A written personalized care plan for preventive services as well as general preventive health recommendations were provided to patient.     Setter GORMAN Daryl, NP 11/04/2023

## 2023-11-07 ENCOUNTER — Encounter: Payer: Self-pay | Admitting: Family

## 2023-11-07 DIAGNOSIS — M5416 Radiculopathy, lumbar region: Secondary | ICD-10-CM

## 2023-11-07 DIAGNOSIS — M5417 Radiculopathy, lumbosacral region: Secondary | ICD-10-CM

## 2023-11-08 MED ORDER — MELOXICAM 7.5 MG PO TABS: 7.5000 mg | ORAL_TABLET | Freq: Every day | ORAL | 0 refills | Status: DC

## 2023-11-08 NOTE — Addendum Note (Signed)
 Addended by: DARYL SETTER on: 11/08/2023 05:07 PM   Modules accepted: Orders

## 2023-12-03 DIAGNOSIS — M5416 Radiculopathy, lumbar region: Secondary | ICD-10-CM | POA: Diagnosis not present

## 2023-12-05 ENCOUNTER — Encounter: Payer: Self-pay | Admitting: Family

## 2023-12-05 MED ORDER — AMOXICILLIN 500 MG PO CAPS: ORAL_CAPSULE | ORAL | 0 refills | Status: DC

## 2023-12-06 ENCOUNTER — Other Ambulatory Visit: Payer: Self-pay | Admitting: Family

## 2023-12-06 DIAGNOSIS — M5417 Radiculopathy, lumbosacral region: Secondary | ICD-10-CM

## 2023-12-09 ENCOUNTER — Ambulatory Visit (HOSPITAL_BASED_OUTPATIENT_CLINIC_OR_DEPARTMENT_OTHER)
Admission: RE | Admit: 2023-12-09 | Discharge: 2023-12-09 | Disposition: A | Discharge status: Home / Self Care | Source: Ambulatory Visit | Attending: Family | Admitting: Family

## 2023-12-09 ENCOUNTER — Encounter (HOSPITAL_BASED_OUTPATIENT_CLINIC_OR_DEPARTMENT_OTHER): Payer: Self-pay

## 2023-12-09 DIAGNOSIS — Z1231 Encounter for screening mammogram for malignant neoplasm of breast: Secondary | ICD-10-CM | POA: Diagnosis not present

## 2023-12-12 ENCOUNTER — Other Ambulatory Visit (HOSPITAL_BASED_OUTPATIENT_CLINIC_OR_DEPARTMENT_OTHER): Payer: Self-pay

## 2023-12-12 ENCOUNTER — Encounter: Payer: Self-pay | Admitting: Student

## 2023-12-12 ENCOUNTER — Ambulatory Visit (INDEPENDENT_AMBULATORY_CARE_PROVIDER_SITE_OTHER): Admitting: Student

## 2023-12-12 VITALS — BP 113/73 | HR 68 | Temp 97.8°F | Resp 12 | Ht 70.0 in | Wt 171.4 lb

## 2023-12-12 DIAGNOSIS — H938X2 Other specified disorders of left ear: Secondary | ICD-10-CM | POA: Diagnosis not present

## 2023-12-12 DIAGNOSIS — H6592 Unspecified nonsuppurative otitis media, left ear: Secondary | ICD-10-CM | POA: Diagnosis not present

## 2023-12-12 MED ORDER — FLUZONE HIGH-DOSE 0.5 ML IM SUSY
0.5000 mL | PREFILLED_SYRINGE | Freq: Once | INTRAMUSCULAR | 0 refills | Status: AC
  Filled 2023-12-12: qty 0.5, 1d supply, fill #0

## 2023-12-12 MED ORDER — COMIRNATY 30 MCG/0.3ML IM SUSY
0.3000 mL | PREFILLED_SYRINGE | Freq: Once | INTRAMUSCULAR | 0 refills | Status: AC
  Filled 2023-12-12: qty 0.3, 1d supply, fill #0

## 2023-12-12 MED ORDER — AZELASTINE HCL 0.1 % NA SOLN: 1.0000 | Freq: Two times a day (BID) | NASAL | 6 refills | Status: AC

## 2023-12-12 NOTE — Progress Notes (Signed)
   Acute Office Visit  Subjective:     Patient ID: Lauren Walters, female    DOB: 12-21-1960, 63 y.o.   MRN: [id number]  Chief Complaint  Patient presents with   left ear clogged    HPI  History of Present Illness Lauren Walters is a 63 year old female who presents with left  ear discomfort and congestion.  She has experienced this for a few weeks, beginning after a trip to [place] in late September. Initially thought to be due to allergies, she had a sore throat and ear discomfort, which she associated with ragweed exposure. She developed a cold and used decongestants, but the ear symptoms persist despite resolution of the cold.  The ear feels 'plugged up' and occasionally painful, especially when chewing, with a sensation that it 'wants to pop.' There are no hearing difficulties or muffled sensation. She has not been swimming or underwater recently.   Current medications include Zyrtec , taken in the fall, and long-term use of Flonase . She had not taken Zyrtec  for about six months prior to this episode.  Patient denies fever, chills, SOB, CP, palpitations, dyspnea, edema, HA, vision changes, N/V/D, abdominal pain, urinary symptoms, rash, weight changes, and recent illness or hospitalizations.   ROS  See HPI    Objective:    BP 113/73 (BP Location: Left Arm, Patient Position: Sitting, Cuff Size: Normal)   Pulse 68   Temp 97.8 F (36.6 C) (Oral)   Resp 12   Ht 5' 10 (1.778 m)   Wt 171 lb 6.4 oz (77.7 kg)   SpO2 97%   BMI 24.59 kg/m    Physical Exam  General: No acute distress. Awake and conversant.  Eyes: Normal conjunctiva, anicteric. Round symmetric pupils.  ENT: Hearing grossly intact. No nasal discharge. TMs- WNL. External Ear NTWP and clear, WNL. Respiratory: CTAB. Respirations are non-labored. No wheezing.  Skin: Warm. No rashes or ulcers.  Psych: Alert and oriented. Cooperative, Appropriate mood and affect, Normal judgment.  CV: RRR. No murmur. No lower  extremity edema.  MSK: Normal ambulation. No clubbing or cyanosis.  Neuro:  CN II-XII grossly normal.    No results found for any visits on 12/12/23.      Assessment & Plan:   Problem List Items Addressed This Visit   None Visit Diagnoses       Congestion of left ear    -  Primary     Left otitis media with effusion         OME post-viral syndrome, no infection or significant pain. Likely r/t allergies and recent URI. - Continue Flonase  daily. - Continue Zyrtec . - Prescribe Astelin spray. - Monitor for worsening symptoms or signs of infection. -RTC if symptom persist or worsen      Meds ordered this encounter  Medications   azelastine (ASTELIN) 0.1 % nasal spray    Sig: Place 1 spray into both nostrils 2 (two) times daily. Use in each nostril as directed    Dispense:  30 mL    Refill:  6    Supervising Provider:   DOMENICA BLACKBIRD A [[id number]]    No follow-ups on file.  Kjerstin Abrigo L Tyrone Pautsch, NP

## 2023-12-28 ENCOUNTER — Other Ambulatory Visit: Payer: Self-pay | Admitting: Family

## 2023-12-30 ENCOUNTER — Encounter: Payer: Self-pay | Admitting: Family

## 2023-12-30 DIAGNOSIS — H65191 Other acute nonsuppurative otitis media, right ear: Secondary | ICD-10-CM

## 2023-12-30 DIAGNOSIS — M79644 Pain in right finger(s): Secondary | ICD-10-CM

## 2024-01-09 ENCOUNTER — Encounter: Payer: Self-pay | Admitting: Family

## 2024-01-09 DIAGNOSIS — M5417 Radiculopathy, lumbosacral region: Secondary | ICD-10-CM

## 2024-01-09 MED ORDER — MELOXICAM 7.5 MG PO TABS: 7.5000 mg | ORAL_TABLET | Freq: Every day | ORAL | 0 refills | Status: AC

## 2024-01-15 DIAGNOSIS — M79644 Pain in right finger(s): Secondary | ICD-10-CM | POA: Diagnosis not present

## 2024-01-16 ENCOUNTER — Other Ambulatory Visit: Payer: Self-pay | Admitting: Family

## 2024-01-30 ENCOUNTER — Encounter (INDEPENDENT_AMBULATORY_CARE_PROVIDER_SITE_OTHER): Payer: Self-pay | Admitting: Otolaryngology

## 2024-01-30 ENCOUNTER — Ambulatory Visit (INDEPENDENT_AMBULATORY_CARE_PROVIDER_SITE_OTHER): Admitting: Otolaryngology

## 2024-01-30 VITALS — BP 131/80 | HR 87 | Ht 70.0 in | Wt 170.0 lb

## 2024-01-30 DIAGNOSIS — H6122 Impacted cerumen, left ear: Secondary | ICD-10-CM | POA: Diagnosis not present

## 2024-01-30 DIAGNOSIS — H938X2 Other specified disorders of left ear: Secondary | ICD-10-CM | POA: Diagnosis not present

## 2024-01-30 DIAGNOSIS — H9313 Tinnitus, bilateral: Secondary | ICD-10-CM | POA: Diagnosis not present

## 2024-01-30 DIAGNOSIS — H6992 Unspecified Eustachian tube disorder, left ear: Secondary | ICD-10-CM | POA: Diagnosis not present

## 2024-01-30 MED ORDER — PREDNISONE 10 MG PO TABS: ORAL_TABLET | ORAL | 0 refills | Status: AC

## 2024-01-30 NOTE — Patient Instructions (Addendum)
 Use afrin 2 sprays each nostril twice daily the day before, day of and day after you fly Use sudafed day before, day of and day after you fly Chew gum on the flight Pop your ears multiple times per day Use flonase  twice daily two sprays Take Prednisone  by mouth (PO) 30mg  x 3 days (3 pills in morning), then 20mg  x3 days (2 pills), then 10mg  x 3 days (1 pill), then stop. Risks discussed -- after you return from the flight.

## 2024-01-30 NOTE — Progress Notes (Signed)
 Dear Dr. Daryl, Here is my assessment for our mutual patient, Lauren Walters. Thank you for allowing me the opportunity to care for your patient. Please do not hesitate to contact me should you have any other questions. Sincerely, Dr. Eldora Blanch  Otolaryngology Clinic Note Referring provider: Dr. Daryl HPI:  Lauren Walters is a 63 y.o. female kindly referred by Dr. Daryl for evaluation of left ear fullness  Initial visit (01/2024): Discussed the use of AI scribe software for clinical note transcription with the patient, who gave verbal consent to proceed.  History of Present Illness Lauren Walters is a 63 year old female who presents with persistent left ear fullness and ringing following a URI and air travel.  Her symptoms began about two months ago after returning from air travel, following a URI. Most URI symptoms resolved, but left ear issues persisted.  She describes a plugged sensation in the left ear with fullness, muffled hearing, and intermittent mild discomfort. Ringing is more noticeable in quiet environments. She had brief relief when the ear popped while walking, but this was short-lived. She denies vertigo, otorrhea, and any hearing loss beyond the muffling.  She uses Flonase  and another prescribed nasal spray for allergies and has not used Astelin  or other nasal sprays.  Patient denies: ear pain, vertigo, drainage, hearing loss Patient additionally denies regular: deep pain in ear canal, eustachian tube symptoms such as popping, crackling, sensitive to pressure changes Patient also denies barotrauma, vestibular suppressant use, ototoxic medication use Prior ear surgery: no Last hearing test in 2019; no C-spine pain. No ASA use Used flonase . Never used astelin .  No significant nasal symptoms otherwise  ENT Surgery: no Personal or FHx of bleeding dz or anesthesia difficulty: no  AP/AC: no  Tobacco: former  PMHx: HTN, GERD  Independent Review  of Additional Tests or Records:  Lauren Walters (12/30/2023): right ear stopped up 2 weeks after URI; still full and tinnitus; Dx: ETD; Rx: ref to ENT Lauren Walters (12/12/2023): left ear discomfort and congestion for few weeks after sprain trip; tried zyrtec , and flonase  initial URI sx; Dx: ETD, OME; Rx: flonaes, zyrtec , Rx astelin  Lauren Walters (09/27/2017): left ear tinnitus and fullness x2 months; no other sx, Rx: systemic steroid, if no improvement, audiogram Labs CMP 10/25/2023: BUN/Cr 11/0.56 Eos 2021: 200  PMH/Meds/All/SocHx/FamHx/ROS:   Past Medical History:  Diagnosis Date   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    HTN (hypertension) 03/05/2015   Inclusion cyst 02/17/2018   Left knee pain 09/24/2016   Neck pain 01/30/2015   Osteopenia    Physical exam 12/18/2013   Postmenopausal HRT (hormone replacement therapy) 07/17/2013   Preventative health care 01/30/2015   Skin tag 04/24/2011   Tinnitus of left ear 09/27/2017   Last Assessment & Plan:  Formatting of this note might be different from the original. Concern over ringing at the left ear. 70-month history.  No associated vertigo.  Some associated ear fullness.  No history of ear trauma surgery or infection.  No major history of noise exposure. EXAM shows normal external canals and tympanic membranes. PLAN: We discussed probably there is some  inflammation.  We    Varicose veins 04/24/2011     Past Surgical History:  Procedure Laterality Date   CESAREAN SECTION  1995   CESAREAN SECTION  1997   CHOLECYSTECTOMY  1991   COLONOSCOPY  03/2015   LaurenNandigam   JOINT REPLACEMENT     R hip   OVARIAN CYST REMOVAL  1990    ROOT CANAL     TOTAL HIP ARTHROPLASTY  2011   TOTAL HIP ARTHROPLASTY Left 2024   WISDOM TOOTH EXTRACTION      Family History  Problem Relation Age of Onset   Dementia Mother    Hypertension Mother    CVA Mother        died from stroke   Aortic stenosis Mother    Alcohol abuse Father        deceased   Cancer Father        prostate, basal cell    Arthritis Father    Heart disease Father    Lymphoma Brother    Heart disease Maternal Grandfather    Arthritis Paternal Grandfather    Heart disease Paternal Grandfather    Colon cancer Neg Hx    Colon polyps Neg Hx    Esophageal cancer Neg Hx    Stomach cancer Neg Hx    Rectal cancer Neg Hx      Social Connections: Socially Integrated (10/30/2023)   Social Connection and Isolation Panel    Frequency of Communication with Friends and Family: More than three times a week    Frequency of Social Gatherings with Friends and Family: Twice a week    Attends Religious Services: 1 to 4 times per year    Active Member of [employer] or Organizations: Yes    Attends Engineer, Structural: More than 4 times per year    Marital Status: Married      Current Outpatient Medications:    amLODipine  (NORVASC ) 2.5 MG tablet, Take 1 tablet (2.5 mg total) by mouth daily., Disp: 90 tablet, Rfl: 1   amoxicillin  (AMOXIL ) 500 MG capsule, TAKE 4 CAPSULES BY MOUTH ONCE FOR 1 DOSE 60 MINUTES PRIOR TO DENTAL PROCEDURE, Disp: 4 capsule, Rfl: 0   azelastine  (ASTELIN ) 0.1 % nasal spray, Place 1 spray into both nostrils 2 (two) times daily. Use in each nostril as directed, Disp: 30 mL, Rfl: 6   betamethasone  valerate ointment (VALISONE ) 0.1 %, Apply 1 Application topically 2 (two) times daily., Disp: 30 g, Rfl: 0   Calcium  Carbonate-Vitamin D  600-400 MG-UNIT chew tablet, Chew 1 tablet by mouth 2 (two) times daily., Disp: , Rfl:    cetirizine  (ZYRTEC ) 10 MG tablet, Take 1 tablet (10 mg total) by mouth daily., Disp: 30 tablet, Rfl: 11   estradiol  (ESTRACE ) 0.1  MG/GM vaginal cream, Place 0.5g nightly for two weeks then twice a week after, Disp: 30 g, Rfl: 11   fluticasone  (FLONASE ) 50 MCG/ACT nasal spray, Place into the nose., Disp: , Rfl:    hydrochlorothiazide  (HYDRODIURIL ) 25 MG tablet, Take 1 tablet (25 mg total) by mouth daily., Disp: 90 tablet, Rfl: 1   meloxicam  (MOBIC ) 7.5 MG tablet, Take 1 tablet (7.5 mg total) by mouth daily., Disp: 30 tablet, Rfl: 0   predniSONE  (DELTASONE ) 10 MG tablet, Take 3 tablets (30 mg total) by mouth daily with breakfast for 3 days, THEN 2 tablets (20 mg total) daily with breakfast for 3 days, THEN 1 tablet (10 mg total) daily with breakfast for 3 days., Disp: 18 tablet, Rfl:  0   Physical Exam:   BP 131/80 (BP Location: Left Arm, Patient Position: Sitting, Cuff Size: Large)   Pulse 87   Ht 5' 10 (1.778 m)   Wt 170 lb (77.1 kg)   SpO2 97%   BMI 24.39 kg/m   Salient findings:  CN II-XII intact Given history and complaints, ear microscopy was indicated and performed for evaluation with findings as below in physical exam section and in procedures; left ear cerumen impaction; Bilateral EAC clear and TM intact with well pneumatized middle ear spaces Weber 512: mid Rinne 512: AC > BC b/l  Anterior rhinoscopy: Septum intact; bilateral inferior turbinates without significant hypertrophy No lesions of oral cavity/oropharynx No obviously palpable neck masses/lymphadenopathy/thyromegaly No respiratory distress or stridor  Seprately Identifiable Procedures:  Prior to initiating any procedures, risks/benefits/alternatives were explained to the patient and verbal consent obtained. Procedure: Bilateral ear microscopy and cerumen removal using microscope (CPT [phone]) - Mod 25 Pre-procedure diagnosis: Cerumen impaction left external ears Post-procedure diagnosis: same Indication: left cerumen impaction; given patient's otologic complaints and history as well as for improved and comprehensive examination of external ear and  tympanic membrane, bilateral otologic examination using microscope was performed and impacted cerumen removed  Procedure: Patient was placed semi-recumbent. Both ear canals were examined using the microscope with findings above. Impacted Cerumen removed on left using suction and currette with improvement in EAC examination and patency. Patient tolerated the procedure well.      Impression & Plans:  Emiliya Chretien is a 63 y.o. female with:  1. Dysfunction of left eustachian tube   2. Sensation of fullness in left ear   3. Bilateral tinnitus    Started after URI and worse with flying. Left > right non-pulsatile tinnitus. Has not tried pred and no effusion today. No audio recently. Suspect ETD  Will continue Flonase , Prednisone  burst and see her back in 8-10 weeks. Discussed what to do when flying  Will get audiogram  See below regarding exact medications prescribed this encounter including dosages and route: Meds ordered this encounter  Medications   predniSONE  (DELTASONE ) 10 MG tablet    Sig: Take 3 tablets (30 mg total) by mouth daily with breakfast for 3 days, THEN 2 tablets (20 mg total) daily with breakfast for 3 days, THEN 1 tablet (10 mg total) daily with breakfast for 3 days.    Dispense:  18 tablet    Refill:  0      Thank you for allowing me the opportunity to care for your patient. Please do not hesitate to contact me should you have any other questions.  Sincerely, Eldora Blanch, MD Otolaryngologist (ENT), [facility] Phone: [phone] Fax: [phone]  01/30/2024, 1:51 PM   MDM:  Level 4 - 99204 Complexity/Problems addressed: mod - chronic problems, persistent Data complexity: mod - independent review of note, labs, ordering test - Morbidity: mod  - Prescription Drug prescribed or managed: y

## 2024-02-18 ENCOUNTER — Encounter: Payer: Self-pay | Admitting: Family

## 2024-02-18 ENCOUNTER — Other Ambulatory Visit: Payer: Self-pay | Admitting: Family

## 2024-02-18 DIAGNOSIS — Z87891 Personal history of nicotine dependence: Secondary | ICD-10-CM

## 2024-03-04 ENCOUNTER — Ambulatory Visit (HOSPITAL_BASED_OUTPATIENT_CLINIC_OR_DEPARTMENT_OTHER)
Admission: RE | Admit: 2024-03-04 | Discharge: 2024-03-04 | Disposition: A | Discharge status: Home / Self Care | Source: Ambulatory Visit | Attending: Family | Admitting: Family

## 2024-03-04 DIAGNOSIS — Z87891 Personal history of nicotine dependence: Secondary | ICD-10-CM | POA: Diagnosis present

## 2024-03-04 DIAGNOSIS — J439 Emphysema, unspecified: Secondary | ICD-10-CM | POA: Insufficient documentation

## 2024-03-04 DIAGNOSIS — I700 Atherosclerosis of aorta: Secondary | ICD-10-CM | POA: Insufficient documentation

## 2024-03-04 DIAGNOSIS — Z122 Encounter for screening for malignant neoplasm of respiratory organs: Secondary | ICD-10-CM | POA: Insufficient documentation

## 2024-03-05 LAB — OPHTHALMOLOGY REPORT-SCANNED

## 2024-03-12 ENCOUNTER — Ambulatory Visit: Payer: Self-pay | Admitting: Family

## 2024-03-17 ENCOUNTER — Other Ambulatory Visit: Payer: Self-pay | Admitting: Family

## 2024-04-09 ENCOUNTER — Ambulatory Visit (INDEPENDENT_AMBULATORY_CARE_PROVIDER_SITE_OTHER): Admitting: Otolaryngology

## 2024-04-09 ENCOUNTER — Ambulatory Visit (INDEPENDENT_AMBULATORY_CARE_PROVIDER_SITE_OTHER): Admitting: Audiology

## 2024-04-28 ENCOUNTER — Ambulatory Visit: Admitting: Family

## 2024-05-13 ENCOUNTER — Ambulatory Visit: Admitting: Family
